# Patient Record
Sex: Female | Born: 1975 | Race: White | Hispanic: No | Smoking: Never smoker
Health system: Southern US, Community
[De-identification: ages and names within clinical notes are randomized; demographics above are authoritative.]

## PROBLEM LIST (undated history)

## (undated) ENCOUNTER — Inpatient Hospital Stay (HOSPITAL_COMMUNITY): Payer: Self-pay

## (undated) DIAGNOSIS — L309 Dermatitis, unspecified: Secondary | ICD-10-CM

## (undated) DIAGNOSIS — Z22322 Carrier or suspected carrier of Methicillin resistant Staphylococcus aureus: Secondary | ICD-10-CM

## (undated) DIAGNOSIS — K589 Irritable bowel syndrome without diarrhea: Secondary | ICD-10-CM

## (undated) DIAGNOSIS — G43409 Hemiplegic migraine, not intractable, without status migrainosus: Secondary | ICD-10-CM

## (undated) DIAGNOSIS — R87629 Unspecified abnormal cytological findings in specimens from vagina: Secondary | ICD-10-CM

## (undated) DIAGNOSIS — G43909 Migraine, unspecified, not intractable, without status migrainosus: Secondary | ICD-10-CM

## (undated) DIAGNOSIS — G8190 Hemiplegia, unspecified affecting unspecified side: Secondary | ICD-10-CM

## (undated) DIAGNOSIS — Z8489 Family history of other specified conditions: Secondary | ICD-10-CM

## (undated) DIAGNOSIS — M797 Fibromyalgia: Secondary | ICD-10-CM

## (undated) DIAGNOSIS — F32A Depression, unspecified: Secondary | ICD-10-CM

## (undated) DIAGNOSIS — F329 Major depressive disorder, single episode, unspecified: Secondary | ICD-10-CM

## (undated) DIAGNOSIS — K219 Gastro-esophageal reflux disease without esophagitis: Secondary | ICD-10-CM

## (undated) HISTORY — DX: Fibromyalgia: M79.7

## (undated) HISTORY — PX: COLPOSCOPY: SHX161

## (undated) HISTORY — PX: MULTIPLE TOOTH EXTRACTIONS: SHX2053

## (undated) HISTORY — PX: WRIST SURGERY: SHX841

## (undated) HISTORY — DX: Unspecified abnormal cytological findings in specimens from vagina: R87.629

---

## 2003-04-25 ENCOUNTER — Encounter: Payer: Self-pay | Admitting: Emergency Medicine

## 2003-04-25 ENCOUNTER — Emergency Department (HOSPITAL_COMMUNITY): Admission: EM | Admit: 2003-04-25 | Discharge: 2003-04-25 | Payer: Self-pay | Admitting: Emergency Medicine

## 2003-05-03 ENCOUNTER — Emergency Department (HOSPITAL_COMMUNITY): Admission: EM | Admit: 2003-05-03 | Discharge: 2003-05-03 | Payer: Self-pay | Admitting: Emergency Medicine

## 2003-05-03 ENCOUNTER — Encounter: Payer: Self-pay | Admitting: Emergency Medicine

## 2004-12-21 ENCOUNTER — Ambulatory Visit (HOSPITAL_COMMUNITY): Admission: RE | Admit: 2004-12-21 | Discharge: 2004-12-21 | Payer: Self-pay | Admitting: Obstetrics and Gynecology

## 2005-01-04 ENCOUNTER — Ambulatory Visit (HOSPITAL_COMMUNITY): Admission: RE | Admit: 2005-01-04 | Discharge: 2005-01-04 | Payer: Self-pay | Admitting: Obstetrics and Gynecology

## 2005-07-24 ENCOUNTER — Inpatient Hospital Stay (HOSPITAL_COMMUNITY): Admission: AD | Admit: 2005-07-24 | Discharge: 2005-07-24 | Payer: Self-pay | Admitting: Obstetrics and Gynecology

## 2005-08-16 ENCOUNTER — Inpatient Hospital Stay (HOSPITAL_COMMUNITY): Admission: AD | Admit: 2005-08-16 | Discharge: 2005-08-17 | Payer: Self-pay | Admitting: Obstetrics and Gynecology

## 2008-04-15 ENCOUNTER — Inpatient Hospital Stay (HOSPITAL_COMMUNITY): Admission: AD | Admit: 2008-04-15 | Discharge: 2008-04-16 | Payer: Self-pay | Admitting: Obstetrics and Gynecology

## 2008-08-27 ENCOUNTER — Inpatient Hospital Stay (HOSPITAL_COMMUNITY): Admission: AD | Admit: 2008-08-27 | Discharge: 2008-08-28 | Payer: Self-pay | Admitting: Obstetrics and Gynecology

## 2008-08-30 ENCOUNTER — Inpatient Hospital Stay (HOSPITAL_COMMUNITY): Admission: AD | Admit: 2008-08-30 | Discharge: 2008-08-30 | Payer: Self-pay | Admitting: Obstetrics and Gynecology

## 2008-10-05 ENCOUNTER — Inpatient Hospital Stay (HOSPITAL_COMMUNITY): Admission: AD | Admit: 2008-10-05 | Discharge: 2008-10-05 | Payer: Self-pay | Admitting: Obstetrics & Gynecology

## 2008-10-07 ENCOUNTER — Inpatient Hospital Stay (HOSPITAL_COMMUNITY): Admission: AD | Admit: 2008-10-07 | Discharge: 2008-10-07 | Payer: Self-pay | Admitting: Obstetrics and Gynecology

## 2008-10-08 ENCOUNTER — Inpatient Hospital Stay (HOSPITAL_COMMUNITY): Admission: AD | Admit: 2008-10-08 | Discharge: 2008-10-09 | Payer: Self-pay | Admitting: Obstetrics and Gynecology

## 2008-11-19 HISTORY — PX: WRIST SURGERY: SHX841

## 2010-04-27 ENCOUNTER — Ambulatory Visit: Payer: Self-pay | Admitting: Emergency Medicine

## 2010-04-27 DIAGNOSIS — S81009A Unspecified open wound, unspecified knee, initial encounter: Secondary | ICD-10-CM | POA: Insufficient documentation

## 2010-04-27 DIAGNOSIS — S81809A Unspecified open wound, unspecified lower leg, initial encounter: Secondary | ICD-10-CM

## 2010-04-27 DIAGNOSIS — K219 Gastro-esophageal reflux disease without esophagitis: Secondary | ICD-10-CM | POA: Insufficient documentation

## 2010-04-27 DIAGNOSIS — S91009A Unspecified open wound, unspecified ankle, initial encounter: Secondary | ICD-10-CM

## 2010-07-06 ENCOUNTER — Encounter: Payer: Self-pay | Admitting: Emergency Medicine

## 2010-12-19 NOTE — Assessment & Plan Note (Signed)
Summary: LACERATION TO LEG/KH   Vital Signs:  Patient Profile:   35 Years Old Female CC:      laceration to right lower leg X today Height:     66.5 inches Weight:      126 pounds O2 Sat:      100 % O2 treatment:    Room Air Temp:     98.5 degrees F oral Pulse rate:   80 / minute Pulse rhythm:   regular Resp:     12 per minute BP sitting:   122 / 78  (right arm) Cuff size:   regular  Pt. in pain?   yes    Location:   right lower leg    Intensity:   8    Type:       burning  Vitals Entered By: Lajean Saver RN (April 27, 2010 1:04 PM)                   Updated Prior Medication List: YAZ 3-0.02 MG TABS (DROSPIRENONE-ETHINYL ESTRADIOL) once daily ALLEGRA-D 24 HOUR 180-240 MG XR24H-TAB (FEXOFENADINE-PSEUDOEPHEDRINE) once daily IMITREX STATDOSE SYSTEM 6 MG/0.5ML KIT (SUMATRIPTAN SUCCINATE) prn  Current Allergies: ! * TRIBUTALIN  History of Present Illness Chief Complaint: laceration to right lower leg X today History of Present Illness: 35yo WF scraped her right shin on a metal shelf at [place].  Last Td in '04.  She fell down on the ground at the time because of her low blood sugar problems but didn't hit head. She drank OJ and felt better and has no hypoglycemic symptoms currently. The leg barely bled for a few minutes but it is tender.  She is here to see if she needs stitches.  REVIEW OF SYSTEMS Constitutional Symptoms      Denies fever, chills, night sweats, weight loss, weight gain, and fatigue.  Eyes       Denies change in vision, eye pain, eye discharge, glasses, contact lenses, and eye surgery. Ear/Nose/Throat/Mouth       Denies hearing loss/aids, change in hearing, ear pain, ear discharge, dizziness, frequent runny nose, frequent nose bleeds, sinus problems, sore throat, hoarseness, and tooth pain or bleeding.  Respiratory       Denies dry cough, productive cough, wheezing, shortness of breath, asthma, bronchitis, and emphysema/COPD.  Cardiovascular  Denies murmurs, chest pain, and tires easily with exhertion.    Gastrointestinal       Denies stomach pain, nausea/vomiting, diarrhea, constipation, blood in bowel movements, and indigestion. Genitourniary       Denies painful urination, kidney stones, and loss of urinary control. Neurological       Denies paralysis, seizures, and fainting/blackouts. Musculoskeletal       Denies muscle pain, joint pain, joint stiffness, decreased range of motion, redness, swelling, muscle weakness, and gout.  Skin       Denies bruising, unusual mles/lumps or sores, and hair/skin or nail changes.      Comments: laceration to right lower leg Psych       Denies mood changes, temper/anger issues, anxiety/stress, speech problems, depression, and sleep problems. 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  FROM leg and foot on right side Neurological: grossly intact and non-focal Skin: RLE shin 1.5cm superficial scrape, not bleeding, not a deep cut, skin already approximately normally, tender around the area, no erythema Assessment New Problems: WOUND OPEN, KNEE/LEG/ANKLE W/O COMPLICATION (ICD-891.0) GERD (ICD-530.81)   Plan New Orders: New Patient Level III  [99203]  The patient and/or caregiver has been counseled thoroughly with regard to medications prescribed including dosage, schedule, interactions, rationale for use, and possible side effects and they verbalize understanding.  Diagnoses and expected course of recovery discussed and will return if not improved as expected or if the condition worsens. Patient and/or caregiver verbalized understanding.   PROCEDURE:  Dermabond Site: right lower leg, shin Size: 1.5cm Number of Lacerations: 1 Anesthesia: none Procedure: Cleaned small laceration with Hibaclens, used dermabond over the cut, then placed 1/4" steristrips over entire wound.  Pt tolerated proc well, no questions afterwards. No blood loss. Disposition: home  Patient Instructions: 1)  Ice the area to decrease inflammation and pain. Tylenol as needed.  Follow-up with your primary care physician if not improving or any signs of infection develop or new symptoms.  Orders Added: 1)  New Patient Level III [[address]]

## 2010-12-19 NOTE — Letter (Signed)
Summary: [facility]  [facility]   Imported By: Dannette Barbara 07/06/2010 09:40:01  _____________________________________________________________________  External Attachment:    Type:   Image     Comment:   External Document

## 2011-04-06 NOTE — H&P (Signed)
NAMEPAZ, FUENTES NO.:  [id number]   MEDICAL RECORD NO.:  [id number]           PATIENT TYPE:   LOCATION:                                 FACILITY:   PHYSICIAN:  Lenoard Aden, M.D.     DATE OF BIRTH:   DATE OF ADMISSION:  08/16/2005  DATE OF DISCHARGE:                                HISTORY & PHYSICAL   CHIEF COMPLAINT:  Labor.   HISTORY OF PRESENT ILLNESS:  She is a 35 year old G3, P2, history of  precipitous labor, group B Strep positive who presents with cervical change.  She has allergies to terbutaline.   MEDICATIONS:  Prenatal vitamins.   She is a nonsmoker, nondrinker.  Denies domestic or physical violence.  History of precipitous labor in her two previous pregnancies with  spontaneous vaginal deliveries at 36 weeks and otherwise uncomplicated  pregnancy outcomes.  Had questionable postpartum hemorrhage.  She has a  family history of sarcoidosis, insulin-dependent diabetes, cerebrovascular  disease.   PRENATAL LABORATORIES:  All within normal limits.  GBS is positive.   PHYSICAL EXAMINATION:  GENERAL:  She is a well-developed, well-nourished  white female in no acute distress.  HEENT:  Normal.  LUNGS:  Clear.  HEART:  Regular rate and rhythm.  ABDOMEN:  Soft, gravid, and nontender.  Estimated fetal weight by ultrasound  8 pounds.  PELVIC:  Cervix 4 cm dilated, 80%, vertex, -1.  EXTREMITIES:  No cords.  NEUROLOGIC:  Nonfocal.   IMPRESSION:  1.  A 39-week OB.  2.  History of precipitous labor with cervical change noted.   PLAN:  To [facility] for augmentation and probable attempts at vaginal  delivery.      Lenoard Aden, M.D.  Electronically Signed     RJT/MEDQ  D:  08/16/2005  T:  08/16/2005  Job:  [id number]

## 2011-08-20 LAB — URINALYSIS, ROUTINE W REFLEX MICROSCOPIC
Bilirubin Urine: NEGATIVE
Glucose, UA: NEGATIVE
Hgb urine dipstick: NEGATIVE
Ketones, ur: NEGATIVE
Nitrite: NEGATIVE
Protein, ur: NEGATIVE
Specific Gravity, Urine: 1.01
Urobilinogen, UA: 0.2
pH: 6.5

## 2011-08-20 LAB — DIC (DISSEMINATED INTRAVASCULAR COAGULATION) PANEL (NOT AT ARMC)
Platelets: 248
Prothrombin Time: 13.8
Smear Review: NONE SEEN

## 2011-08-20 LAB — CBC
HCT: 29.9 — ABNORMAL LOW
HCT: 31.3 — ABNORMAL LOW
Hemoglobin: 10.1 — ABNORMAL LOW
Hemoglobin: 10.6 — ABNORMAL LOW
MCHC: 33.9
MCHC: 34
MCV: 94.2
MCV: 94.6
Platelets: 232
Platelets: 240
RBC: 3.16 — ABNORMAL LOW
RBC: 3.32 — ABNORMAL LOW
RDW: 13.7
RDW: 13.9
WBC: 8.8
WBC: 9

## 2011-08-20 LAB — DIC (DISSEMINATED INTRAVASCULAR COAGULATION)PANEL
D-Dimer, Quant: 1.51 — ABNORMAL HIGH
Fibrinogen: 328
INR: 1
aPTT: 27

## 2011-08-20 LAB — URINE MICROSCOPIC-ADD ON: RBC / HPF: NONE SEEN

## 2011-08-21 LAB — CBC
HCT: 26.3 — ABNORMAL LOW
HCT: 32.3 — ABNORMAL LOW
Hemoglobin: 11.2 — ABNORMAL LOW
Hemoglobin: 9.3 — ABNORMAL LOW
MCHC: 34.6
MCHC: 35.2
MCV: 93.4
MCV: 94.4
Platelets: 209
Platelets: 246
RBC: 2.79 — ABNORMAL LOW
RBC: 3.46 — ABNORMAL LOW
RDW: 13.1
RDW: 13.7
WBC: 9.6
WBC: 9.7

## 2011-08-21 LAB — RPR: RPR Ser Ql: NONREACTIVE

## 2012-12-30 ENCOUNTER — Encounter: Payer: Self-pay | Admitting: *Deleted

## 2012-12-30 ENCOUNTER — Emergency Department (INDEPENDENT_AMBULATORY_CARE_PROVIDER_SITE_OTHER)
Admission: EM | Admit: 2012-12-30 | Discharge: 2012-12-30 | Disposition: A | Discharge status: Home / Self Care | Payer: Medicare Other | Source: Home / Self Care | Attending: Family Medicine | Admitting: Family Medicine

## 2012-12-30 DIAGNOSIS — J029 Acute pharyngitis, unspecified: Secondary | ICD-10-CM

## 2012-12-30 DIAGNOSIS — H60399 Other infective otitis externa, unspecified ear: Secondary | ICD-10-CM

## 2012-12-30 HISTORY — DX: Gastro-esophageal reflux disease without esophagitis: K21.9

## 2012-12-30 HISTORY — DX: Irritable bowel syndrome, unspecified: K58.9

## 2012-12-30 HISTORY — DX: Major depressive disorder, single episode, unspecified: F32.9

## 2012-12-30 HISTORY — DX: Carrier or suspected carrier of methicillin resistant Staphylococcus aureus: Z22.322

## 2012-12-30 HISTORY — DX: Depression, unspecified: F32.A

## 2012-12-30 HISTORY — DX: Migraine, unspecified, not intractable, without status migrainosus: G43.909

## 2012-12-30 LAB — POCT RAPID STREP A (OFFICE): Rapid Strep A Screen: NEGATIVE

## 2012-12-30 MED ORDER — NEOMYCIN-POLYMYXIN-HC 3.5-10000-1 OT SOLN: 3.0000 [drp] | Freq: Four times a day (QID) | OTIC | Status: AC

## 2012-12-30 NOTE — ED Notes (Signed)
Pt c/o sore throat x 1 day. Denies fever.  

## 2012-12-30 NOTE — ED Provider Notes (Signed)
History     CSN: [id number]  Arrival date & time 12/30/12  1308   First MD Initiated Contact with Patient 12/30/12 1936      Chief Complaint  Patient presents with  . Sore Throat    HPI  URI Symptoms Onset: 2 days  Description: rhinorrhea, nasal congestion, sore throat, L ear pain  Modifying factors:  None   Symptoms Nasal discharge: yes Fever: no Sore throat: yes Cough: no Wheezing: no Ear pain: yes GI symptoms: mild Sick contacts: yes  Red Flags  Stiff neck: no Dyspnea: no Rash: no Swallowing difficulty: no  Sinusitis Risk Factors Headache/face pain: no Double sickening: no tooth pain: no  Allergy Risk Factors Sneezing: no Itchy scratchy throat: mild Seasonal symptoms: yes  Flu Risk Factors Headache: no muscle aches: no severe fatigue: no   Past Medical History  Diagnosis Date  . Migraine   . IBS (irritable bowel syndrome)   . Depression   . MRSA (methicillin resistant staph aureus) culture positive   . GERD (gastroesophageal reflux disease)     Past Surgical History  Procedure Laterality Date  . Wrist surgery      cyst removal    Family History  Problem Relation Age of Onset  . Thyroid disease Mother   . Scoliosis Mother   . Depression Mother   . Crohn's disease Father   . Autoimmune disease Father   . Factor V Leiden deficiency Father   . Heart failure Father   . Diabetes Father     History  Substance Use Topics  . Smoking status: Never Smoker   . Smokeless tobacco: Not on file  . Alcohol Use: No    OB History   Grav Para Term Preterm Abortions TAB SAB Ect Mult Living                  Review of Systems  All other systems reviewed and are negative.    Allergies  Adhesive  Home Medications   Current Outpatient Rx  Name  Route  Sig  Dispense  Refill  . loratadine-pseudoephedrine (CLARITIN-D 24-HOUR) 10-240 MG per 24 hr tablet   Oral   Take 1 tablet by mouth daily.         . norethindrone  (MICRONOR,CAMILA,ERRIN) 0.35 MG tablet   Oral   Take 1 tablet by mouth daily.         . sertraline (ZOLOFT) 50 MG tablet   Oral   Take 50 mg by mouth daily.         Marland Kitchen neomycin-polymyxin-hydrocortisone (CORTISPORIN) otic solution   Left Ear   Place 3 drops into the left ear 4 (four) times daily.   10 mL   0     BP 124/77  Pulse 82  Temp(Src) 98.1 F (36.7 C) (Oral)  Ht 5' 6.75" (1.695 m)  Wt 148 lb (67.132 kg)  BMI 23.37 kg/m2  SpO2 98%  Physical Exam  Constitutional: She appears well-developed and well-nourished.  HENT:  Head: Normocephalic and atraumatic.  Right Ear: External ear normal.  Left Ear: External ear normal.  +nasal erythema, rhinorrhea bilaterally, + post oropharyngeal erythema    Eyes: Conjunctivae are normal. Pupils are equal, round, and reactive to light.  Neck: Normal range of motion. Neck supple.  Cardiovascular: Normal rate, regular rhythm and normal heart sounds.   Pulmonary/Chest: Effort normal and breath sounds normal.  Abdominal: Soft. Bowel sounds are normal.  Musculoskeletal: Normal range of motion.  Lymphadenopathy:    She  has cervical adenopathy.  Neurological: She is alert.  Skin: Skin is warm.    ED Course  Procedures (including critical care time)  Labs Reviewed  STREP A DNA PROBE  POCT RAPID STREP A (OFFICE)   No results found.   1. Acute pharyngitis   2. Otitis, externa, infective       MDM  Centor score 3/4. Rapid strep negative. Will culture.  Will treat otitis externa with cortisporin.  Discussed infectious and ENT red flags that would warrant emergent evaluation.  Follow up as needed.     The patient and/or caregiver has been counseled thoroughly with regard to treatment plan and/or medications prescribed including dosage, schedule, interactions, rationale for use, and possible side effects and they verbalize understanding. Diagnoses and expected course of recovery discussed and will return if not improved as  expected or if the condition worsens. Patient and/or caregiver verbalized understanding.             Doree Albee, MD 12/30/12 1958

## 2012-12-31 LAB — STREP A DNA PROBE: GASP: NEGATIVE

## 2013-01-02 ENCOUNTER — Telehealth: Payer: Self-pay | Admitting: *Deleted

## 2014-07-20 DIAGNOSIS — Z973 Presence of spectacles and contact lenses: Secondary | ICD-10-CM | POA: Insufficient documentation

## 2014-11-19 NOTE — L&D Delivery Note (Signed)
Patient arrived to MAU and was 8 cm dilated. She was immediately transferred to L and D.  I was en route to the hospital when she delivered precipitously in the bed.  Baby was vigorous infant. When I arrived, the midwife was present and delivered the placenta.  At that point of care I took over.   No lacerations. EBL 300 cc. Apgars 9 9  Weight pending Mother to postpartum and baby to regular nursery after skin to skin.

## 2014-12-28 LAB — OB RESULTS CONSOLE ABO/RH: RH Type: POSITIVE

## 2014-12-28 LAB — OB RESULTS CONSOLE RUBELLA ANTIBODY, IGM: Rubella: IMMUNE

## 2014-12-28 LAB — OB RESULTS CONSOLE ANTIBODY SCREEN: Antibody Screen: NEGATIVE

## 2014-12-28 LAB — OB RESULTS CONSOLE HEPATITIS B SURFACE ANTIGEN: Hepatitis B Surface Ag: NEGATIVE

## 2014-12-28 LAB — OB RESULTS CONSOLE RPR: RPR: NONREACTIVE

## 2014-12-28 LAB — OB RESULTS CONSOLE HIV ANTIBODY (ROUTINE TESTING): HIV: NONREACTIVE

## 2015-01-07 LAB — OB RESULTS CONSOLE GC/CHLAMYDIA
Chlamydia: NEGATIVE
Gonorrhea: NEGATIVE

## 2015-05-26 ENCOUNTER — Inpatient Hospital Stay (HOSPITAL_COMMUNITY)
Admission: AD | Admit: 2015-05-26 | Discharge: 2015-05-26 | Disposition: A | Discharge status: Home / Self Care | Payer: 59 | Source: Ambulatory Visit | Attending: Obstetrics & Gynecology | Admitting: Obstetrics & Gynecology

## 2015-05-26 ENCOUNTER — Encounter (HOSPITAL_COMMUNITY): Payer: Self-pay

## 2015-05-26 DIAGNOSIS — Z3A36 36 weeks gestation of pregnancy: Secondary | ICD-10-CM | POA: Diagnosis not present

## 2015-05-26 DIAGNOSIS — O368130 Decreased fetal movements, third trimester, not applicable or unspecified: Secondary | ICD-10-CM | POA: Insufficient documentation

## 2015-05-26 DIAGNOSIS — O4700 False labor before 37 completed weeks of gestation, unspecified trimester: Secondary | ICD-10-CM | POA: Diagnosis not present

## 2015-05-26 DIAGNOSIS — Z3A31 31 weeks gestation of pregnancy: Secondary | ICD-10-CM | POA: Diagnosis not present

## 2015-05-26 DIAGNOSIS — O4703 False labor before 37 completed weeks of gestation, third trimester: Secondary | ICD-10-CM | POA: Diagnosis not present

## 2015-05-26 LAB — CBC WITH DIFFERENTIAL/PLATELET
Basophils Absolute: 0 10*3/uL (ref 0.0–0.1)
Basophils Relative: 0 % (ref 0–1)
Eosinophils Absolute: 0.2 10*3/uL (ref 0.0–0.7)
Eosinophils Relative: 2 % (ref 0–5)
HCT: 31.7 % — ABNORMAL LOW (ref 36.0–46.0)
Hemoglobin: 10.6 g/dL — ABNORMAL LOW (ref 12.0–15.0)
Lymphocytes Relative: 23 % (ref 12–46)
Lymphs Abs: 2 10*3/uL (ref 0.7–4.0)
MCH: 30.3 pg (ref 26.0–34.0)
MCHC: 33.4 g/dL (ref 30.0–36.0)
MCV: 90.6 fL (ref 78.0–100.0)
Monocytes Absolute: 0.7 10*3/uL (ref 0.1–1.0)
Monocytes Relative: 7 % (ref 3–12)
Neutro Abs: 6.2 10*3/uL (ref 1.7–7.7)
Neutrophils Relative %: 68 % (ref 43–77)
Platelets: 219 10*3/uL (ref 150–400)
RBC: 3.5 MIL/uL — ABNORMAL LOW (ref 3.87–5.11)
RDW: 14 % (ref 11.5–15.5)
WBC: 9.1 10*3/uL (ref 4.0–10.5)

## 2015-05-26 LAB — URINALYSIS, ROUTINE W REFLEX MICROSCOPIC
Bilirubin Urine: NEGATIVE
Glucose, UA: NEGATIVE mg/dL
Hgb urine dipstick: NEGATIVE
Ketones, ur: NEGATIVE mg/dL
Nitrite: NEGATIVE
Protein, ur: NEGATIVE mg/dL
Specific Gravity, Urine: <1.005 — ABNORMAL LOW (ref 1.005–1.030)
Urobilinogen, UA: 0.2 mg/dL (ref 0.0–1.0)
pH: 5.5 (ref 5.0–8.0)

## 2015-05-26 LAB — WET PREP, GENITAL
Clue Cells Wet Prep HPF POC: NONE SEEN
Trich, Wet Prep: NONE SEEN
Yeast Wet Prep HPF POC: NONE SEEN

## 2015-05-26 LAB — URINE MICROSCOPIC-ADD ON

## 2015-05-26 MED ORDER — LACTATED RINGERS IV BOLUS (SEPSIS)
1000.0000 mL | Freq: Once | INTRAVENOUS | Status: AC
  Administered 2015-05-26: 1000 mL via INTRAVENOUS

## 2015-05-26 MED ORDER — HYDROCODONE-ACETAMINOPHEN 5-325 MG PO TABS
2.0000 | ORAL_TABLET | Freq: Once | ORAL | Status: AC
  Administered 2015-05-26: 2 via ORAL
  Filled 2015-05-26: qty 2

## 2015-05-26 MED ORDER — BUTALBITAL-APAP-CAFFEINE 50-325-40 MG PO TABS: 2.0000 | ORAL_TABLET | Freq: Once | ORAL | Status: DC

## 2015-05-26 MED ORDER — NIFEDIPINE 10 MG PO CAPS
20.0000 mg | ORAL_CAPSULE | ORAL | Status: AC
  Administered 2015-05-26 (×3): 20 mg via ORAL
  Filled 2015-05-26 (×3): qty 2

## 2015-05-26 NOTE — Discharge Instructions (Signed)

## 2015-05-26 NOTE — MAU Note (Signed)
Vaginal spotting since Tuesday; was brown spotting, tonight changed to bright red. Denies known problems with placenta.  Right sided abdominal pain since Sunday; constant & burning.  Decreased fetal movement x 4 days; still feeling baby move but says there's a change in her movement.

## 2015-05-26 NOTE — MAU Provider Note (Signed)
History     CSN: [id number]  Arrival date and time: 05/26/15 [id number]   First Provider Initiated Contact with Patient 05/26/15 2029      Chief Complaint  Patient presents with  . Abdominal Pain  . Vaginal Bleeding  . Decreased Fetal Movement   HPI Comments: Holly [name] is a 39 y.o. [id number] at [id number] who presents today vaginal spotting and right sided abdominal pain. She states that she has had the pain since Sunday. She had some spotting last Tuesday as well. It turned brown, and seemed to stop. However today it returned, and it was red. She has had three prior preterm deliveries. She states that her earliest delivery was at 35.2 weeks.    Abdominal Pain This is a new problem. The current episode started in the past 7 days (Sunday ). The onset quality is gradual. The problem occurs constantly. The problem has been unchanged. The pain is located in the RLQ. The pain is at a severity of 4/10. The quality of the pain is sharp. The abdominal pain does not radiate. Associated symptoms include diarrhea, nausea and vomiting. Pertinent negatives include no constipation, dysuria, fever or frequency. The pain is aggravated by movement. The pain is relieved by recumbency (warm bath ). The treatment provided mild relief.  Vaginal Bleeding The patient's primary symptoms include vaginal bleeding. This is a new problem. The current episode started in the past 7 days. The problem occurs intermittently. The pain is moderate. She is pregnant. Associated symptoms include abdominal pain, diarrhea, nausea and vomiting. Pertinent negatives include no constipation, dysuria, fever, frequency or urgency. The vaginal discharge was bloody. The vaginal bleeding is spotting. She has not been passing clots. She has not been passing tissue. Sexual activity: no intercourse in the last 24 hours     Past Medical History  Diagnosis Date  . Migraine   . IBS (irritable bowel syndrome)   . Depression   . MRSA (methicillin  resistant staph aureus) culture positive   . GERD (gastroesophageal reflux disease)     Past Surgical History  Procedure Laterality Date  . Wrist surgery      cyst removal    Family History  Problem Relation Age of Onset  . Thyroid disease Mother   . Scoliosis Mother   . Depression Mother   . Crohn's disease Father   . Autoimmune disease Father   . Factor V Leiden deficiency Father   . Heart failure Father   . Diabetes Father     History  Substance Use Topics  . Smoking status: Never Smoker   . Smokeless tobacco: Not on file  . Alcohol Use: No    Allergies:  Allergies  Allergen Reactions  . Terbutaline Palpitations  . Adhesive [Tape] Itching and Rash  . Ciprofloxacin Rash  . Zyrtec Allergy [Cetirizine Hcl] Other (See Comments)    Overly sleepy    Prescriptions prior to admission  Medication Sig Dispense Refill Last Dose  . butalbital-acetaminophen-caffeine (FIORICET WITH CODEINE) 50-325-40-30 MG per capsule Take 1 capsule by mouth every 4 (four) hours as needed for headache.   05/26/2015 at 1200  . calcium carbonate (TUMS - DOSED IN MG ELEMENTAL CALCIUM) 500 MG chewable tablet Chew 1 tablet by mouth as needed for indigestion or heartburn. Patient takes up to six times daily prn.   05/26/2015 at Unknown time  . Prenatal Vit-Fe Fumarate-FA (PRENATAL MULTIVITAMIN) TABS tablet Take 1 tablet by mouth at bedtime.   05/25/2015 at Unknown time  . QUEtiapine (  SEROQUEL) 25 MG tablet Take 25-50 mg by mouth at bedtime.    05/25/2015 at Unknown time    Review of Systems  Constitutional: Negative for fever.  Gastrointestinal: Positive for nausea, vomiting, abdominal pain and diarrhea. Negative for constipation.  Genitourinary: Positive for vaginal bleeding. Negative for dysuria, urgency and frequency.   Physical Exam   Blood pressure 114/73, pulse 82, temperature 98.5 F (36.9 C), temperature source Oral, resp. rate 18, height  (1.676 m), weight 77.656 kg (171 lb 3.2 oz), SpO2  100 %.  Physical Exam  Nursing note and vitals reviewed. Constitutional: She is oriented to person, place, and time. She appears well-developed and well-nourished. No distress.  HENT:  Head: Normocephalic.  Cardiovascular: Normal rate.   Respiratory: Effort normal.  GI: Soft. There is no tenderness. There is no rebound.  Genitourinary:   External: no lesion Vagina: small amount of white discharge. No blood seen  Cervix: pink, smooth, closed/thick/high  Uterus: AGA    Neurological: She is alert and oriented to person, place, and time.  Skin: Skin is warm and dry.   FHT 135, moderate with 15x15 accels, no decels Toco: q 3-5 mins  MAU Course  Procedures  MDM 2207: Patient has had three doses of procardia and 1L of LR. Contractions have decreased. Now about q 10-12 mins  Patient reports that abdominal pain is gone now, and her headache has improved. 2215: D/W Dr. Langston Masker, ok for DC home at this time.   Assessment and Plan   1. Threatened preterm labor, third trimester    DC home Comfort measures reviewed  3rd Trimester precautions  PTL precautions  Fetal kick counts RX: none  Return to MAU as needed FU with OB as planned  Follow-up Information    Follow up with MORRIS, MEGAN, DO.   Specialty:  Obstetrics and Gynecology   Why:  As scheduled   Contact information:   [address], [address] [place] [address] [phone]         Tawnya Crook 05/26/2015, 8:31 PM

## 2015-06-06 ENCOUNTER — Inpatient Hospital Stay (HOSPITAL_COMMUNITY)
Admission: AD | Admit: 2015-06-06 | Discharge: 2015-06-06 | Disposition: A | Discharge status: Home / Self Care | Payer: Medicare (Managed Care) | Source: Ambulatory Visit | Attending: Obstetrics and Gynecology | Admitting: Obstetrics and Gynecology

## 2015-06-06 ENCOUNTER — Encounter (HOSPITAL_COMMUNITY): Payer: Self-pay

## 2015-06-06 DIAGNOSIS — Z3A32 32 weeks gestation of pregnancy: Secondary | ICD-10-CM

## 2015-06-06 DIAGNOSIS — O212 Late vomiting of pregnancy: Secondary | ICD-10-CM | POA: Insufficient documentation

## 2015-06-06 DIAGNOSIS — O4703 False labor before 37 completed weeks of gestation, third trimester: Secondary | ICD-10-CM | POA: Diagnosis not present

## 2015-06-06 DIAGNOSIS — Z3A31 31 weeks gestation of pregnancy: Secondary | ICD-10-CM | POA: Diagnosis not present

## 2015-06-06 LAB — URINALYSIS, ROUTINE W REFLEX MICROSCOPIC
Bilirubin Urine: NEGATIVE
Glucose, UA: NEGATIVE mg/dL
Ketones, ur: NEGATIVE mg/dL
Nitrite: NEGATIVE
Protein, ur: NEGATIVE mg/dL
Specific Gravity, Urine: <1.005 — ABNORMAL LOW (ref 1.005–1.030)
Urobilinogen, UA: 0.2 mg/dL (ref 0.0–1.0)
pH: 6.5 (ref 5.0–8.0)

## 2015-06-06 LAB — URINE MICROSCOPIC-ADD ON

## 2015-06-06 LAB — FETAL FIBRONECTIN: Fetal Fibronectin: NEGATIVE

## 2015-06-06 MED ORDER — PROMETHAZINE HCL 12.5 MG PO TABS: 12.5000 mg | ORAL_TABLET | Freq: Four times a day (QID) | ORAL | Status: DC | PRN

## 2015-06-06 NOTE — MAU Provider Note (Signed)
Chief Complaint:  Morning Sickness; Emesis During Pregnancy; Contractions; and Diarrhea   First Provider Initiated Contact with Patient 06/06/15 1957      HPI: Holly [name] is a 39 y.o. [id number] at 4767w6d who presents to maternity admissions reporting nausea, vomiting, diarrhea off and on accompanied by abdominal cramping all with onset yesterday morning.  She reports she feels like this in early labor and is worried that is what is going on.  She reports 2-3 episodes of vomiting and diarrhea x 3 since onset of symptoms.  She denies sick contacts. She reports good fetal movement, denies LOF, vaginal bleeding, vaginal itching/burning, urinary symptoms, h/a, dizziness, or fever/chills.    Diarrhea  This is a new problem. The current episode started yesterday. The problem occurs 2 to 4 times per day. The problem has been unchanged. The stool consistency is described as watery. The patient states that diarrhea does not awaken her from sleep. Associated symptoms include abdominal pain and vomiting. Pertinent negatives include no chills, coughing, fever or headaches. She has tried nothing for the symptoms.  Abdominal Pain This is a recurrent problem. The current episode started yesterday. The onset quality is gradual. The problem occurs intermittently. The most recent episode lasted 1 day. The problem has been waxing and waning. The pain is located in the LLQ and RLQ. The pain is moderate. The quality of the pain is cramping. The abdominal pain radiates to the back. Associated symptoms include diarrhea, nausea and vomiting. Pertinent negatives include no constipation, dysuria, fever, frequency or headaches. Nothing aggravates the pain. She has tried nothing for the symptoms.    Past Medical History: Past Medical History  Diagnosis Date  . Migraine   . IBS (irritable bowel syndrome)   . Depression   . MRSA (methicillin resistant staph aureus) culture positive   . GERD (gastroesophageal reflux  disease)     Past obstetric history: OB History  Gravida Para Term Preterm AB SAB TAB Ectopic Multiple Living  6 5 2 3      5     # Outcome Date GA Lbr Len/2nd Weight Sex Delivery Anes PTL Lv  6 Current           5 Term 02/2012 1246w0d  7 lb 1 oz (3.204 kg) Genella MechM Vag-Spont EPI  Y  4 Term 09/2008 7125w2d  7 lb 9 oz (3.43 kg) F Vag-Spont Local  Y  3 Preterm 2006 7567w5d  7 lb 10 oz (3.459 kg) F Vag-Spont None  Y  2 Preterm 2004 7651w0d  6 lb 11 oz (3.033 kg) M Vag-Spont   Y  1 Preterm 2001 2460w0d  6 lb 3 oz (2.807 kg) M Vag-Spont   Y      Past Surgical History: Past Surgical History  Procedure Laterality Date  . Wrist surgery      cyst removal    Family History: Family History  Problem Relation Age of Onset  . Thyroid disease Mother   . Scoliosis Mother   . Depression Mother   . Crohn's disease Father   . Autoimmune disease Father   . Factor V Leiden deficiency Father   . Heart failure Father   . Diabetes Father     Social History: History  Substance Use Topics  . Smoking status: Never Smoker   . Smokeless tobacco: Not on file  . Alcohol Use: No    Allergies:  Allergies  Allergen Reactions  . Terbutaline Palpitations  . Adhesive [Tape] Itching and Rash  . Ciprofloxacin  Rash  . Zyrtec Allergy [Cetirizine Hcl] Other (See Comments)    Overly sleepy    Meds:  No prescriptions prior to admission    Review of Systems  Constitutional: Negative for fever, chills and malaise/fatigue.  Eyes: Negative for blurred vision.  Respiratory: Negative for cough and shortness of breath.   Cardiovascular: Negative for chest pain.  Gastrointestinal: Positive for nausea, vomiting, abdominal pain and diarrhea. Negative for heartburn and constipation.  Genitourinary: Negative for dysuria, urgency and frequency.  Musculoskeletal: Negative.   Neurological: Negative for dizziness and headaches.  Psychiatric/Behavioral: Negative for depression.    Physical Exam  Blood pressure 121/58,  pulse 85, temperature 98.3 F (36.8 C), temperature source Oral, resp. rate 16, height 5' 4.5" (1.638 m), weight 170 lb 6.4 oz (77.293 kg). GENERAL: Well-developed, well-nourished female in no acute distress.  EYES: normal sclera/conjunctiva; no lid-lag HENT: Atraumatic, normocephalic HEART: normal rate RESP: normal effort ABDOMEN: Soft, non-tender MUSCULOSKELETAL: Normal ROM EXTREMITIES: Nontender, no edema NEURO/PSYCH: Alert and oriented, appropriate affect Dilation: Closed Effacement (%): 20 Cervical Position: Posterior Station: -3 Presentation: Vertex Exam by:: Harlon Flor, PA-C  Labs: Results for orders placed or performed during the hospital encounter of 06/06/15 (from the past 24 hour(s))  Urinalysis, Routine w reflex microscopic (not at [facility])     Status: Abnormal   Collection Time: 06/06/15  6:31 PM  Result Value Ref Range   Color, Urine YELLOW YELLOW   APPearance CLEAR CLEAR   Specific Gravity, Urine <1.005 (L) 1.005 - 1.030   pH 6.5 5.0 - 8.0   Glucose, UA NEGATIVE NEGATIVE mg/dL   Hgb urine dipstick TRACE (A) NEGATIVE   Bilirubin Urine NEGATIVE NEGATIVE   Ketones, ur NEGATIVE NEGATIVE mg/dL   Protein, ur NEGATIVE NEGATIVE mg/dL   Urobilinogen, UA 0.2 0.0 - 1.0 mg/dL   Nitrite NEGATIVE NEGATIVE   Leukocytes, UA SMALL (A) NEGATIVE  Urine microscopic-add on     Status: None   Collection Time: 06/06/15  6:31 PM  Result Value Ref Range   Squamous Epithelial / LPF RARE RARE   WBC, UA 3-6 <3 WBC/hpf   RBC / HPF 0-2 <3 RBC/hpf   Bacteria, UA RARE RARE  Fetal fibronectin     Status: None   Collection Time: 06/06/15  8:30 PM  Result Value Ref Range   Fetal Fibronectin NEGATIVE NEGATIVE   Report to Vonzella Nipple, PA  Follow-up Information    Follow up with LOWE,DAVID C, MD.   Specialty:  Obstetrics and Gynecology   Why:  As scheduled or sooner if symptoms worsen   Contact information:   [address] [place] [address] [phone]        Sharen Counter Certified Nurse-Midwife 06/06/2015 8:07 PM  MDM 2008 - Care assumed from Sharen Counter, CNM FFN - negative Discussed patient with Dr. Rana Snare. Recommends Rx for Phenergan. Follow-up as scheduled  Fetal Monitoring: Baseline: 140 bpm, moderate variability, + accelerations, no decelerations Contractions: q 4 minutes with intermittent UI  A: SIUP at [redacted]w[redacted]d Preterm contractions N/V in pregnancy  P: Discharge home Rx for Phenergan sent to patient's pharmacy Preterm labor precautions discussed Patient advised to follow-up with [facility] as scheduled for routine prenatal care or sooner PRN Patient may return to MAU as needed or if her condition were to change or worsen   Marny Lowenstein, PA-C  06/06/2015 11:14 PM

## 2015-06-06 NOTE — Discharge Instructions (Signed)
Pelvic Rest °Pelvic rest is sometimes recommended for women when:  °· The placenta is partially or completely covering the opening of the cervix (placenta previa). °· There is bleeding between the uterine wall and the amniotic sac in the first trimester (subchorionic hemorrhage). °· The cervix begins to open without labor starting (incompetent cervix, cervical insufficiency). °· The labor is too early (preterm labor). °HOME CARE INSTRUCTIONS °· Do not have sexual intercourse, stimulation, or an orgasm. °· Do not use tampons, douche, or put anything in the vagina. °· Do not lift anything over 10 pounds (4.5 kg). °· Avoid strenuous activity or straining your pelvic muscles. °SEEK MEDICAL CARE IF:  °· You have any vaginal bleeding during pregnancy. Treat this as a potential emergency. °· You have cramping pain felt low in the stomach (stronger than menstrual cramps). °· You notice vaginal discharge (watery, mucus, or bloody). °· You have a low, dull backache. °· There are regular contractions or uterine tightening. °SEEK IMMEDIATE MEDICAL CARE IF: °You have vaginal bleeding and have placenta previa.  °Document Released: 03/02/2011 Document Revised: 01/28/2012 Document Reviewed: 03/02/2011 °ExitCare® Patient Information ©2015 [employer]. This information is not intended to replace advice given to you by your health care provider. Make sure you discuss any questions you have with your health care provider. ° °Braxton Hicks Contractions °Contractions of the uterus can occur throughout pregnancy. Contractions are not always a sign that you are in labor.  °WHAT ARE BRAXTON HICKS CONTRACTIONS?  °Contractions that occur before labor are called Braxton Hicks contractions, or false labor. Toward the end of pregnancy (32-34 weeks), these contractions can develop more often and may become more forceful. This is not true labor because these contractions do not result in opening (dilatation) and thinning of the cervix. They are  sometimes difficult to tell apart from true labor because these contractions can be forceful and people have different pain tolerances. You should not feel embarrassed if you go to the hospital with false labor. Sometimes, the only way to tell if you are in true labor is for your health care provider to look for changes in the cervix. °If there are no prenatal problems or other health problems associated with the pregnancy, it is completely safe to be sent home with false labor and await the onset of true labor. °HOW CAN YOU TELL THE DIFFERENCE BETWEEN TRUE AND FALSE LABOR? °False Labor °· The contractions of false labor are usually shorter and not as hard as those of true labor.   °· The contractions are usually irregular.   °· The contractions are often felt in the front of the lower abdomen and in the groin.   °· The contractions may go away when you walk around or change positions while lying down.   °· The contractions get weaker and are shorter lasting as time goes on.   °· The contractions do not usually become progressively stronger, regular, and closer together as with true labor.   °True Labor °· Contractions in true labor last 30-70 seconds, become very regular, usually become more intense, and increase in frequency.   °· The contractions do not go away with walking.   °· The discomfort is usually felt in the top of the uterus and spreads to the lower abdomen and low back.   °· True labor can be determined by your health care provider with an exam. This will show that the cervix is dilating and getting thinner.   °WHAT TO REMEMBER °· Keep up with your usual exercises and follow other instructions given by your   health care provider.   °· Take medicines as directed by your health care provider.   °· Keep your regular prenatal appointments.   °· Eat and drink lightly if you think you are going into labor.   °· If Braxton Hicks contractions are making you uncomfortable:   °· Change your position from lying  down or resting to walking, or from walking to resting.   °· Sit and rest in a tub of warm water.   °· Drink 2-3 glasses of water. Dehydration may cause these contractions.   °· Do slow and deep breathing several times an hour.   °WHEN SHOULD I SEEK IMMEDIATE MEDICAL CARE? °Seek immediate medical care if: °· Your contractions become stronger, more regular, and closer together.   °· You have fluid leaking or gushing from your vagina.   °· You have a fever.   °· You pass blood-tinged mucus.   °· You have vaginal bleeding.   °· You have continuous abdominal pain.   °· You have low back pain that you never had before.   °· You feel your baby's head pushing down and causing pelvic pressure.   °· Your baby is not moving as much as it used to.   °Document Released: 11/05/2005 Document Revised: 11/10/2013 Document Reviewed: 08/17/2013 °ExitCare® Patient Information ©2015 [employer]. This information is not intended to replace advice given to you by your health care provider. Make sure you discuss any questions you have with your health care provider. ° °Preterm Labor Information °Preterm labor is when labor starts at less than 37 weeks of pregnancy. The normal length of a pregnancy is 39 to 41 weeks. °CAUSES °Often, there is no identifiable underlying cause as to why a woman goes into preterm labor. One of the most common known causes of preterm labor is infection. Infections of the uterus, cervix, vagina, amniotic sac, bladder, kidney, or even the lungs (pneumonia) can cause labor to start. Other suspected causes of preterm labor include:  °· Urogenital infections, such as yeast infections and bacterial vaginosis.   °· Uterine abnormalities (uterine shape, uterine septum, fibroids, or bleeding from the placenta).   °· A cervix that has been operated on (it may fail to stay closed).   °· Malformations in the fetus.   °· Multiple gestations (twins, triplets, and so on).   °· Breakage of the amniotic sac.   °RISK  FACTORS °· Having a previous history of preterm labor.   °· Having premature rupture of membranes (PROM).   °· Having a placenta that covers the opening of the cervix (placenta previa).   °· Having a placenta that separates from the uterus (placental abruption).   °· Having a cervix that is too weak to hold the fetus in the uterus (incompetent cervix).   °· Having too much fluid in the amniotic sac (polyhydramnios).   °· Taking illegal drugs or smoking while pregnant.   °· Not gaining enough weight while pregnant.   °· Being younger than 18 and older than 37 years old.   °· Having a low socioeconomic status.   °· Being African American. °SYMPTOMS °Signs and symptoms of preterm labor include:  °· Menstrual-like cramps, abdominal pain, or back pain. °· Uterine contractions that are regular, as frequent as six in an hour, regardless of their intensity (may be mild or painful). °· Contractions that start on the top of the uterus and spread down to the lower abdomen and back.   °· A sense of increased pelvic pressure.   °· A watery or bloody mucus discharge that comes from the vagina.   °TREATMENT °Depending on the length of the pregnancy and other circumstances, your health care provider may suggest bed rest.   If necessary, there are medicines that can be given to stop contractions and to mature the fetal lungs. If labor happens before 34 weeks of pregnancy, a prolonged hospital stay may be recommended. Treatment depends on the condition of both you and the fetus.  °WHAT SHOULD YOU DO IF YOU THINK YOU ARE IN PRETERM LABOR? °Call your health care provider right away. You will need to go to the hospital to get checked immediately. °HOW CAN YOU PREVENT PRETERM LABOR IN FUTURE PREGNANCIES? °You should:  °· Stop smoking if you smoke.  °· Maintain healthy weight gain and avoid chemicals and drugs that are not necessary. °· Be watchful for any type of infection. °· Inform your health care provider if you have a known history of  preterm labor. °Document Released: 01/26/2004 Document Revised: 07/08/2013 Document Reviewed: 12/08/2012 °ExitCare® Patient Information ©2015 [employer]. This information is not intended to replace advice given to you by your health care provider. Make sure you discuss any questions you have with your health care provider. ° °

## 2015-06-06 NOTE — MAU Note (Signed)
Pt c/o n/v/d since yesterday. Denies fever or being around anyone sick. States contractions today

## 2015-06-13 ENCOUNTER — Inpatient Hospital Stay (HOSPITAL_COMMUNITY)
Admission: AD | Admit: 2015-06-13 | Discharge: 2015-06-13 | Disposition: A | Discharge status: Home / Self Care | Payer: Medicare (Managed Care) | Source: Ambulatory Visit | Attending: Obstetrics & Gynecology | Admitting: Obstetrics & Gynecology

## 2015-06-13 ENCOUNTER — Encounter (HOSPITAL_COMMUNITY): Payer: Self-pay | Admitting: *Deleted

## 2015-06-13 DIAGNOSIS — Z3A32 32 weeks gestation of pregnancy: Secondary | ICD-10-CM | POA: Diagnosis not present

## 2015-06-13 DIAGNOSIS — O4703 False labor before 37 completed weeks of gestation, third trimester: Secondary | ICD-10-CM

## 2015-06-13 DIAGNOSIS — O218 Other vomiting complicating pregnancy: Secondary | ICD-10-CM

## 2015-06-13 DIAGNOSIS — O212 Late vomiting of pregnancy: Secondary | ICD-10-CM | POA: Diagnosis not present

## 2015-06-13 DIAGNOSIS — K589 Irritable bowel syndrome without diarrhea: Secondary | ICD-10-CM | POA: Diagnosis not present

## 2015-06-13 DIAGNOSIS — O99613 Diseases of the digestive system complicating pregnancy, third trimester: Secondary | ICD-10-CM | POA: Diagnosis not present

## 2015-06-13 LAB — URINE MICROSCOPIC-ADD ON

## 2015-06-13 LAB — URINALYSIS, ROUTINE W REFLEX MICROSCOPIC
Bilirubin Urine: NEGATIVE
Glucose, UA: NEGATIVE mg/dL
Hgb urine dipstick: NEGATIVE
Ketones, ur: NEGATIVE mg/dL
Nitrite: NEGATIVE
Protein, ur: NEGATIVE mg/dL
Specific Gravity, Urine: 1.01 (ref 1.005–1.030)
Urobilinogen, UA: 0.2 mg/dL (ref 0.0–1.0)
pH: 6 (ref 5.0–8.0)

## 2015-06-13 MED ORDER — ONDANSETRON 8 MG PO TBDP: 8.0000 mg | ORAL_TABLET | Freq: Three times a day (TID) | ORAL | Status: DC | PRN

## 2015-06-13 MED ORDER — ONDANSETRON 8 MG PO TBDP
8.0000 mg | ORAL_TABLET | Freq: Three times a day (TID) | ORAL | Status: DC | PRN
  Administered 2015-06-13: 8 mg via ORAL
  Filled 2015-06-13: qty 1

## 2015-06-13 NOTE — MAU Note (Signed)
Patient presents at [redacted] weeks gestation with c/o vaginal bleeding since Friday following a transvaginal ultrasound; vomiting since Saturday; diarrhea and loss of mucus plug this morning. Fetus active. Denies discharge.

## 2015-06-13 NOTE — MAU Provider Note (Signed)
History   [id number] at 32.6 wks in with c/o contractions, diarrhea, and vomiting since last night. Has gotten a little better today but is concerned with her hx of PTL.  CSN: [id number]  Arrival date and time: 06/13/15 1313   None     Chief Complaint  Patient presents with  . Vaginal Bleeding  . Emesis  . Diarrhea  . Loss of Mucus Plug    HPI  OB History    Gravida Para Term Preterm AB TAB SAB Ectopic Multiple Living   Past Medical History  Diagnosis Date  . Migraine   . IBS (irritable bowel syndrome)   . Depression   . MRSA (methicillin resistant staph aureus) culture positive   . GERD (gastroesophageal reflux disease)     Past Surgical History  Procedure Laterality Date  . Wrist surgery      cyst removal    Family History  Problem Relation Age of Onset  . Thyroid disease Mother   . Scoliosis Mother   . Depression Mother   . Crohn's disease Father   . Autoimmune disease Father   . Factor V Leiden deficiency Father   . Heart failure Father   . Diabetes Father     History  Substance Use Topics  . Smoking status: Never Smoker   . Smokeless tobacco: Not on file  . Alcohol Use: No    Allergies:  Allergies  Allergen Reactions  . Terbutaline Palpitations  . Adhesive [Tape] Itching and Rash  . Ciprofloxacin Rash  . Zyrtec Allergy [Cetirizine Hcl] Other (See Comments)    Overly sleepy    Prescriptions prior to admission  Medication Sig Dispense Refill Last Dose  . acetaminophen (TYLENOL) 500 MG tablet Take 1,000 mg by mouth every 6 (six) hours as needed.   Past Week at Unknown time  . butalbital-acetaminophen-caffeine (FIORICET WITH CODEINE) 50-325-40-30 MG per capsule Take 1 capsule by mouth every 4 (four) hours as needed for headache.   Past Week at Unknown time  . calcium carbonate (TUMS - DOSED IN MG ELEMENTAL CALCIUM) 500 MG chewable tablet Chew 1 tablet by mouth as needed for indigestion or heartburn. Patient takes up to six  times daily prn.   06/13/2015 at Unknown time  . Prenatal Vit-Fe Fumarate-FA (PRENATAL MULTIVITAMIN) TABS tablet Take 1 tablet by mouth at bedtime.   06/12/2015 at Unknown time  . promethazine (PHENERGAN) 12.5 MG tablet Take 1 tablet (12.5 mg total) by mouth every 6 (six) hours as needed for nausea or vomiting. 30 tablet 0 Past Week at Unknown time  . QUEtiapine (SEROQUEL) 25 MG tablet Take 25-50 mg by mouth at bedtime. For sleep and depression   06/10/2015    Review of Systems  Constitutional: Negative.   HENT: Negative.   Eyes: Negative.   Respiratory: Negative.   Cardiovascular: Negative.   Gastrointestinal: Positive for nausea, vomiting, abdominal pain and diarrhea.  Genitourinary: Negative.   Musculoskeletal: Negative.   Skin: Negative.   Neurological: Negative.   Endo/Heme/Allergies: Negative.   Psychiatric/Behavioral: Negative.    Physical Exam   Blood pressure 114/67, pulse 90, temperature 98.3 F (36.8 C), temperature source Oral, resp. rate 18, height 5' 4.5" (1.638 m), weight 170 lb 6 oz (77.282 kg).  Physical Exam  Constitutional: She is oriented to person, place, and time. She appears well-developed and well-nourished.  HENT:  Head: Normocephalic.  Eyes: Pupils are equal, round, and reactive  to light.  Neck: Normal range of motion.  Cardiovascular: Normal rate, regular rhythm, normal heart sounds and intact distal pulses.   Respiratory: Effort normal and breath sounds normal.  GI: Soft. Bowel sounds are normal.  Genitourinary: Vagina normal and uterus normal.  Musculoskeletal: Normal range of motion.  Neurological: She is alert and oriented to person, place, and time. She has normal reflexes.  Skin: Skin is warm and dry.  Psychiatric: She has a normal mood and affect. Her behavior is normal. Judgment and thought content normal.    MAU Course  Procedures  MDM preg at 32.6 not in labor IBS Vomiting and diarrhea  Assessment and Plan  preg ant 32.6 not in  labor IBS Vomiting and diarrhea SVE ft/th/post/high. zofran 8 mg ODT introduce liquids if tolerates d/c home.  [name] 06/13/2015, 2:08 PM

## 2015-06-13 NOTE — Discharge Instructions (Signed)

## 2015-06-26 ENCOUNTER — Encounter (HOSPITAL_COMMUNITY): Payer: Self-pay | Admitting: *Deleted

## 2015-06-26 ENCOUNTER — Inpatient Hospital Stay (HOSPITAL_COMMUNITY)
Admission: AD | Admit: 2015-06-26 | Discharge: 2015-06-27 | Disposition: A | Discharge status: Home / Self Care | Payer: Medicare (Managed Care) | Source: Ambulatory Visit | Attending: Obstetrics and Gynecology | Admitting: Obstetrics and Gynecology

## 2015-06-26 DIAGNOSIS — O4703 False labor before 37 completed weeks of gestation, third trimester: Secondary | ICD-10-CM

## 2015-06-26 DIAGNOSIS — Z3A34 34 weeks gestation of pregnancy: Secondary | ICD-10-CM | POA: Diagnosis not present

## 2015-06-26 LAB — URINALYSIS, ROUTINE W REFLEX MICROSCOPIC
Bilirubin Urine: NEGATIVE
Glucose, UA: NEGATIVE mg/dL
Hgb urine dipstick: NEGATIVE
Ketones, ur: NEGATIVE mg/dL
Leukocytes, UA: NEGATIVE
Nitrite: NEGATIVE
Protein, ur: NEGATIVE mg/dL
Specific Gravity, Urine: 1.01 (ref 1.005–1.030)
Urobilinogen, UA: 0.2 mg/dL (ref 0.0–1.0)
pH: 6 (ref 5.0–8.0)

## 2015-06-26 MED ORDER — NIFEDIPINE 10 MG PO CAPS
10.0000 mg | ORAL_CAPSULE | Freq: Once | ORAL | Status: AC
Start: 2015-06-26 — End: 2015-06-26
  Administered 2015-06-26: 10 mg via ORAL
  Filled 2015-06-26: qty 1

## 2015-06-26 NOTE — MAU Note (Signed)
Pt states she is having severe pain at regular intervals, unsure if it is contractions or not. Some pinkish discharge.

## 2015-06-26 NOTE — MAU Provider Note (Signed)
History     CSN: [id number]  Arrival date and time: 06/26/15 2134   First Provider Initiated Contact with Patient 06/26/15 2240      Chief Complaint  Patient presents with  . Contractions   HPI  Ms. Holly [name] is a 39 y.o. [id number] at [redacted]w[redacted]d here with report of contractions that started shortly after 1800.  Contractions were every 5-7 minutes.  Denies vaginal bleeding or leaking of fluid.  +pink discharge.  Checked in office an found to be a fingertip.  Past Medical History  Diagnosis Date  . Migraine   . IBS (irritable bowel syndrome)   . Depression   . MRSA (methicillin resistant staph aureus) culture positive   . GERD (gastroesophageal reflux disease)     Past Surgical History  Procedure Laterality Date  . Wrist surgery      cyst removal    Family History  Problem Relation Age of Onset  . Thyroid disease Mother   . Scoliosis Mother   . Depression Mother   . Crohn's disease Father   . Autoimmune disease Father   . Factor V Leiden deficiency Father   . Heart failure Father   . Diabetes Father     History  Substance Use Topics  . Smoking status: Never Smoker   . Smokeless tobacco: Not on file  . Alcohol Use: No    Allergies:  Allergies  Allergen Reactions  . Terbutaline Palpitations  . Adhesive [Tape] Itching and Rash  . Ciprofloxacin Rash  . Zyrtec Allergy [Cetirizine Hcl] Other (See Comments)    Overly sleepy    Prescriptions prior to admission  Medication Sig Dispense Refill Last Dose  . acetaminophen (TYLENOL) 500 MG tablet Take 1,000 mg by mouth every 6 (six) hours as needed.   06/26/2015 at Unknown time  . butalbital-acetaminophen-caffeine (FIORICET WITH CODEINE) 50-325-40-30 MG per capsule Take 1 capsule by mouth every 4 (four) hours as needed for headache.   Past Week at Unknown time  . calcium carbonate (TUMS - DOSED IN MG ELEMENTAL CALCIUM) 500 MG chewable tablet Chew 1 tablet by mouth as needed for indigestion or heartburn. Patient takes  up to six times daily prn.   06/26/2015 at Unknown time  . ondansetron (ZOFRAN-ODT) 8 MG disintegrating tablet Take 1 tablet (8 mg total) by mouth every 8 (eight) hours as needed for nausea or vomiting. 20 tablet 0 two weeks at two weeks  . Prenatal Vit-Fe Fumarate-FA (PRENATAL MULTIVITAMIN) TABS tablet Take 1 tablet by mouth at bedtime.   06/25/2015 at Unknown time  . promethazine (PHENERGAN) 12.5 MG tablet Take 1 tablet (12.5 mg total) by mouth every 6 (six) hours as needed for nausea or vomiting. 30 tablet 0 two weeks at two weeks  . QUEtiapine (SEROQUEL) 25 MG tablet Take 25-50 mg by mouth at bedtime. For sleep and depression   06/25/2015 at Unknown time    Review of Systems  Eyes: Negative for blurred vision and double vision.  Gastrointestinal: Positive for nausea (with contractions earlier tonight) and abdominal pain (contractions). Negative for vomiting, diarrhea and constipation.  Genitourinary: Negative for dysuria, urgency and frequency.  Musculoskeletal: Negative for back pain.  Neurological: Positive for headaches (migraines).  All other systems reviewed and are negative.  Physical Exam   Blood pressure 120/65, pulse 81, temperature 98.6 F (37 C), temperature source Oral, resp. rate 18, height 5\' 6"  (1.676 m), weight 77.565 kg (171 lb), SpO2 99 %.  Physical Exam  Constitutional: She is oriented to  person, place, and time. She appears well-developed and well-nourished.  HENT:  Head: Normocephalic.  Neck: Normal range of motion. Neck supple.  Cardiovascular: Normal rate, regular rhythm and normal heart sounds.   Respiratory: Effort normal and breath sounds normal. No respiratory distress.  GI: Soft. There is no tenderness.  Genitourinary: No bleeding in the vagina. Vaginal discharge (mucusy) found.  Musculoskeletal: Normal range of motion. She exhibits no edema.  Neurological: She is alert and oriented to person, place, and time.  Skin: Skin is warm and dry.   FHR 140's,  +accels Toco 3-5 MAU Course  Procedures  2250 Dr. Arelia Sneddon called > Reviewed HPI/Exam/labs/tracing > give subQ terb and PO hydrate then reassess  0005 Cervical recheck 2/thick > Dr. Arelia Sneddon called with reassessment > repeat procardia (pt couldn't take terb due to prior reaction)  0140 Cervical recheck 2/thick > pt states contractions have spaced out 0145 Dr. Arelia Sneddon called > given reassessment report > discharge to home with procardia and follow-up in office this week Assessment and Plan  39 y.o. [id number] at [redacted]w[redacted]d IUP Preterm Contractions  Plan: Discharge to home RX Procardia 10 mg po prn  Follow-up in office this week  Elenora Fender [facility] 06/26/2015, 1:51 AM

## 2015-06-27 DIAGNOSIS — O4703 False labor before 37 completed weeks of gestation, third trimester: Secondary | ICD-10-CM | POA: Diagnosis not present

## 2015-06-27 MED ORDER — NIFEDIPINE 10 MG PO CAPS: 10.0000 mg | ORAL_CAPSULE | Freq: Three times a day (TID) | ORAL | Status: DC | PRN

## 2015-06-27 MED ORDER — NIFEDIPINE 10 MG PO CAPS
10.0000 mg | ORAL_CAPSULE | Freq: Once | ORAL | Status: AC
  Administered 2015-06-27: 10 mg via ORAL
  Filled 2015-06-27: qty 1

## 2015-06-27 NOTE — Discharge Instructions (Signed)

## 2015-07-04 ENCOUNTER — Inpatient Hospital Stay (HOSPITAL_COMMUNITY)
Admission: AD | Admit: 2015-07-04 | Discharge: 2015-07-04 | Disposition: A | Discharge status: Home / Self Care | Payer: Medicare (Managed Care) | Source: Ambulatory Visit | Attending: Obstetrics and Gynecology | Admitting: Obstetrics and Gynecology

## 2015-07-04 ENCOUNTER — Encounter (HOSPITAL_COMMUNITY): Payer: Self-pay | Admitting: *Deleted

## 2015-07-04 ENCOUNTER — Encounter (HOSPITAL_COMMUNITY): Payer: Self-pay

## 2015-07-04 ENCOUNTER — Inpatient Hospital Stay (HOSPITAL_COMMUNITY)
Admission: AD | Admit: 2015-07-04 | Discharge: 2015-07-04 | Disposition: A | Discharge status: Home / Self Care | Payer: Medicare (Managed Care) | Source: Ambulatory Visit | Attending: Obstetrics & Gynecology | Admitting: Obstetrics & Gynecology

## 2015-07-04 DIAGNOSIS — O4703 False labor before 37 completed weeks of gestation, third trimester: Secondary | ICD-10-CM

## 2015-07-04 DIAGNOSIS — O479 False labor, unspecified: Secondary | ICD-10-CM

## 2015-07-04 DIAGNOSIS — Z3A35 35 weeks gestation of pregnancy: Secondary | ICD-10-CM | POA: Insufficient documentation

## 2015-07-04 LAB — POCT FERN TEST: POCT Fern Test: NEGATIVE

## 2015-07-04 NOTE — MAU Note (Signed)
Patient presents 27 w and 6 d pregnant.  Patient reports possible ROM. Increasing back pressure and wet pad.  Denies vaginal bleeding or discharge. Patient reports pain with contractions.

## 2015-07-04 NOTE — MAU Note (Signed)
Pt c/o contractions every 3-4 mins since 0230. Took tylenol pm last night to try and relieve pain. Has had some nausea tonight. States she has had some spotting and changes panty liner every 2-3 hours. Denies LOF. +FM

## 2015-07-04 NOTE — Discharge Instructions (Signed)
Braxton Hicks Contractions °Contractions of the uterus can occur throughout pregnancy. Contractions are not always a sign that you are in labor.  °WHAT ARE BRAXTON HICKS CONTRACTIONS?  °Contractions that occur before labor are called Braxton Hicks contractions, or false labor. Toward the end of pregnancy (32-34 weeks), these contractions can develop more often and may become more forceful. This is not true labor because these contractions do not result in opening (dilatation) and thinning of the cervix. They are sometimes difficult to tell apart from true labor because these contractions can be forceful and people have different pain tolerances. You should not feel embarrassed if you go to the hospital with false labor. Sometimes, the only way to tell if you are in true labor is for your health care provider to look for changes in the cervix. °If there are no prenatal problems or other health problems associated with the pregnancy, it is completely safe to be sent home with false labor and await the onset of true labor. °HOW CAN YOU TELL THE DIFFERENCE BETWEEN TRUE AND FALSE LABOR? °False Labor °· The contractions of false labor are usually shorter and not as hard as those of true labor.   °· The contractions are usually irregular.   °· The contractions are often felt in the front of the lower abdomen and in the groin.   °· The contractions may go away when you walk around or change positions while lying down.   °· The contractions get weaker and are shorter lasting as time goes on.   °· The contractions do not usually become progressively stronger, regular, and closer together as with true labor.   °True Labor °· Contractions in true labor last 30-70 seconds, become very regular, usually become more intense, and increase in frequency.   °· The contractions do not go away with walking.   °· The discomfort is usually felt in the top of the uterus and spreads to the lower abdomen and low back.   °· True labor can be  determined by your health care provider with an exam. This will show that the cervix is dilating and getting thinner.   °WHAT TO REMEMBER °· Keep up with your usual exercises and follow other instructions given by your health care provider.   °· Take medicines as directed by your health care provider.   °· Keep your regular prenatal appointments.   °· Eat and drink lightly if you think you are going into labor.   °· If Braxton Hicks contractions are making you uncomfortable:   °¨ Change your position from lying down or resting to walking, or from walking to resting.   °¨ Sit and rest in a tub of warm water.   °¨ Drink 2-3 glasses of water. Dehydration may cause these contractions.   °¨ Do slow and deep breathing several times an hour.   °WHEN SHOULD I SEEK IMMEDIATE MEDICAL CARE? °Seek immediate medical care if: °· Your contractions become stronger, more regular, and closer together.   °· You have fluid leaking or gushing from your vagina.   °· You have a fever.   °· You pass blood-tinged mucus.   °· You have vaginal bleeding.   °· You have continuous abdominal pain.   °· You have low back pain that you never had before.   °· You feel your baby's head pushing down and causing pelvic pressure.   °· Your baby is not moving as much as it used to.   °Document Released: 11/05/2005 Document Revised: 11/10/2013 Document Reviewed: 08/17/2013 °[employer]® Patient Information ©2015 [employer]. This information is not intended to replace advice given to you by your health care   provider. Make sure you discuss any questions you have with your health care provider. ° °

## 2015-07-04 NOTE — MAU Provider Note (Signed)
History     CSN: [id number]  Arrival date and time: 07/04/15 0435   First Provider Initiated Contact with Patient 07/04/15 [phone]      Chief Complaint  Patient presents with  . Contractions  . Vaginal Bleeding   HPI  Holly [name] is a 39 y.o. [id number] at [redacted]w[redacted]d here with report of contractions that started around 0230.  Contractions are occurring every 3-4 minutes.  Unable to rest even after taking Tylenol PM.  +nausea, without vomiting.  Also reports spotting.  Denies LOF. +FM   Past Medical History  Diagnosis Date  . Migraine   . IBS (irritable bowel syndrome)   . Depression   . MRSA (methicillin resistant staph aureus) culture positive   . GERD (gastroesophageal reflux disease)     Past Surgical History  Procedure Laterality Date  . Wrist surgery      cyst removal    Family History  Problem Relation Age of Onset  . Thyroid disease Mother   . Scoliosis Mother   . Depression Mother   . Crohn's disease Father   . Autoimmune disease Father   . Factor V Leiden deficiency Father   . Heart failure Father   . Diabetes Father     Social History  Substance Use Topics  . Smoking status: Never Smoker   . Smokeless tobacco: None  . Alcohol Use: No    Allergies:  Allergies  Allergen Reactions  . Terbutaline Palpitations  . Adhesive [Tape] Itching and Rash  . Ciprofloxacin Rash  . Zyrtec Allergy [Cetirizine Hcl] Other (See Comments)    Overly sleepy    Prescriptions prior to admission  Medication Sig Dispense Refill Last Dose  . acetaminophen (TYLENOL) 500 MG tablet Take 1,000 mg by mouth every 6 (six) hours as needed.   07/03/2015 at Unknown time  . calcium carbonate (TUMS - DOSED IN MG ELEMENTAL CALCIUM) 500 MG chewable tablet Chew 1 tablet by mouth as needed for indigestion or heartburn. Patient takes up to six times daily prn.   07/04/2015 at Unknown time  . Prenatal Vit-Fe Fumarate-FA (PRENATAL MULTIVITAMIN) TABS tablet Take 1 tablet by mouth at  bedtime.   07/03/2015 at Unknown time  . QUEtiapine (SEROQUEL) 25 MG tablet Take 25-50 mg by mouth at bedtime as needed (for sleep). For sleep and depression   Past Week at Unknown time  . butalbital-acetaminophen-caffeine (FIORICET WITH CODEINE) 50-325-40-30 MG per capsule Take 1 capsule by mouth every 4 (four) hours as needed for headache.   prn  . NIFEdipine (PROCARDIA) 10 MG capsule Take 1 capsule (10 mg total) by mouth 3 (three) times daily as needed. (Patient not taking: Reported on 07/04/2015) 30 capsule 1   . ondansetron (ZOFRAN-ODT) 8 MG disintegrating tablet Take 1 tablet (8 mg total) by mouth every 8 (eight) hours as needed for nausea or vomiting. (Patient not taking: Reported on 07/04/2015) 20 tablet 0 two weeks at two weeks    Review of Systems  Constitutional: Negative for fever.  Gastrointestinal: Positive for nausea and abdominal pain (contractions). Negative for vomiting.  Genitourinary:       Spotting of blood  All other systems reviewed and are negative.  Physical Exam   Blood pressure 116/68, pulse 71, temperature 98.6 F (37 C), temperature source Oral, resp. rate 18, height  (1.676 m), weight 78.019 kg (172 lb), SpO2 99 %.  Physical Exam  Constitutional: She is oriented to person, place, and time. She appears well-developed and well-nourished.  HENT:  Head: Normocephalic.  Neck: Normal range of motion. Neck supple.  Cardiovascular: Normal rate, regular rhythm and normal heart sounds.   Respiratory: Effort normal and breath sounds normal. No respiratory distress.  GI: Soft. There is no tenderness.  Genitourinary: No bleeding in the vagina. Vaginal discharge (mucusy) found.  Musculoskeletal: Normal range of motion. She exhibits no edema.  Neurological: She is alert and oriented to person, place, and time.  Skin: Skin is warm and dry.   MAU Course  Procedures  MDM 0515 Cervical check 3.5/50/-3 (no blood seen in vagina or on glove) FHR 130's, +accels; Toco  2-3.5  0715 Cervical check 3.5/50/-3  0730 Consulted with Dr. Langston Masker > Reviewed HPI/Exam/pt concern that delivery is soon approaching > may observe an additional hour and then recheck.  0800 Report given to J. Rasch who assumes care of patient.  Rochele Pages N 07/04/2015, 8:00 AM  Assessment and Plan

## 2015-07-04 NOTE — MAU Provider Note (Signed)
Subjective:   [name] is a 39 y.o. female [phone] at [redacted]w[redacted]d here with possible ROM. She presented this morning with contractions; she was here for 4 hours without cervical change. She left here and shortly after felt like her pad was more wet than usual. She has a 30 minute drive home and just wants to confirm whether her water is broke.   + fetal movement Continues to have mucus like, bloody show and irregular contractions.   Objective:   GENERAL: Well-developed, well-nourished female in no acute distress.  LUNGS: Effort normal SKIN: Warm, dry and without erythema PSYCH: Normal mood and affect.  Filed Vitals:   07/04/15 1130  BP: 122/71  Pulse: 96  Temp:   Resp: 18   MDM:  Speculum exam: Vagina - Small amount of creamy, pink mucus like discharge, no odor Cervix - No contact bleeding, no active bleeding noted.  Bimanual exam: Per RN Chaperone present for exam.    A:  Vaginal discharge with bloody show. Unlikely ROM Fern negative  P:   RN to call Dr. Langston Masker with results.    Duane Lope, NP 07/04/2015 11:31 AM

## 2015-07-04 NOTE — Discharge Instructions (Signed)

## 2015-07-07 ENCOUNTER — Encounter (HOSPITAL_COMMUNITY): Payer: Self-pay | Admitting: Emergency Medicine

## 2015-07-07 ENCOUNTER — Inpatient Hospital Stay (HOSPITAL_COMMUNITY)
Admission: AD | Admit: 2015-07-07 | Discharge: 2015-07-09 | DRG: 775 | Disposition: A | Discharge status: Home / Self Care | Payer: Medicare (Managed Care) | Source: Ambulatory Visit | Attending: Obstetrics and Gynecology | Admitting: Obstetrics and Gynecology

## 2015-07-07 DIAGNOSIS — Z3A36 36 weeks gestation of pregnancy: Secondary | ICD-10-CM | POA: Diagnosis present

## 2015-07-07 DIAGNOSIS — O9962 Diseases of the digestive system complicating childbirth: Secondary | ICD-10-CM | POA: Diagnosis present

## 2015-07-07 DIAGNOSIS — O09523 Supervision of elderly multigravida, third trimester: Secondary | ICD-10-CM

## 2015-07-07 DIAGNOSIS — K219 Gastro-esophageal reflux disease without esophagitis: Secondary | ICD-10-CM | POA: Diagnosis present

## 2015-07-07 DIAGNOSIS — Z833 Family history of diabetes mellitus: Secondary | ICD-10-CM

## 2015-07-07 DIAGNOSIS — Z8249 Family history of ischemic heart disease and other diseases of the circulatory system: Secondary | ICD-10-CM

## 2015-07-07 LAB — RPR: RPR Ser Ql: NONREACTIVE

## 2015-07-07 LAB — RAPID HIV SCREEN (HIV 1/2 AB+AG)
HIV 1/2 Antibodies: NONREACTIVE
HIV-1 P24 Antigen - HIV24: NONREACTIVE

## 2015-07-07 LAB — CBC
HCT: 28.9 % — ABNORMAL LOW (ref 36.0–46.0)
Hemoglobin: 9.6 g/dL — ABNORMAL LOW (ref 12.0–15.0)
MCH: 30 pg (ref 26.0–34.0)
MCHC: 33.2 g/dL (ref 30.0–36.0)
MCV: 90.3 fL (ref 78.0–100.0)
Platelets: 190 10*3/uL (ref 150–400)
RBC: 3.2 MIL/uL — ABNORMAL LOW (ref 3.87–5.11)
RDW: 14 % (ref 11.5–15.5)
WBC: 14.1 10*3/uL — ABNORMAL HIGH (ref 4.0–10.5)

## 2015-07-07 MED ORDER — DIBUCAINE 1 % RE OINT
1.0000 "application " | TOPICAL_OINTMENT | RECTAL | Status: DC | PRN
  Filled 2015-07-07: qty 28

## 2015-07-07 MED ORDER — ACETAMINOPHEN 325 MG PO TABS: 650.0000 mg | ORAL_TABLET | ORAL | Status: DC | PRN

## 2015-07-07 MED ORDER — LACTATED RINGERS IV SOLN: 500.0000 mL | INTRAVENOUS | Status: DC | PRN

## 2015-07-07 MED ORDER — OXYTOCIN 10 UNIT/ML IJ SOLN
10.0000 [IU] | Freq: Once | INTRAMUSCULAR | Status: AC
  Administered 2015-07-07: 10 [IU] via INTRAMUSCULAR

## 2015-07-07 MED ORDER — LANOLIN HYDROUS EX OINT: TOPICAL_OINTMENT | CUTANEOUS | Status: DC | PRN

## 2015-07-07 MED ORDER — OXYTOCIN 40 UNITS IN LACTATED RINGERS INFUSION - SIMPLE MED
INTRAVENOUS | Status: AC
  Filled 2015-07-07: qty 1000

## 2015-07-07 MED ORDER — SIMETHICONE 80 MG PO CHEW: 80.0000 mg | CHEWABLE_TABLET | ORAL | Status: DC | PRN

## 2015-07-07 MED ORDER — LIDOCAINE HCL (PF) 1 % IJ SOLN
INTRAMUSCULAR | Status: AC
  Filled 2015-07-07: qty 30

## 2015-07-07 MED ORDER — OXYCODONE-ACETAMINOPHEN 5-325 MG PO TABS: 1.0000 | ORAL_TABLET | ORAL | Status: DC | PRN

## 2015-07-07 MED ORDER — TETANUS-DIPHTH-ACELL PERTUSSIS 5-2.5-18.5 LF-MCG/0.5 IM SUSP: 0.5000 mL | Freq: Once | INTRAMUSCULAR | Status: DC

## 2015-07-07 MED ORDER — LIDOCAINE HCL (PF) 1 % IJ SOLN: 30.0000 mL | INTRAMUSCULAR | Status: DC | PRN

## 2015-07-07 MED ORDER — IBUPROFEN 600 MG PO TABS
600.0000 mg | ORAL_TABLET | Freq: Four times a day (QID) | ORAL | Status: DC
  Administered 2015-07-07 – 2015-07-09 (×10): 600 mg via ORAL
  Filled 2015-07-07 (×10): qty 1

## 2015-07-07 MED ORDER — MEASLES, MUMPS & RUBELLA VAC ~~LOC~~ INJ
0.5000 mL | INJECTION | Freq: Once | SUBCUTANEOUS | Status: DC
  Filled 2015-07-07: qty 0.5

## 2015-07-07 MED ORDER — ONDANSETRON HCL 4 MG PO TABS
4.0000 mg | ORAL_TABLET | ORAL | Status: DC | PRN
  Administered 2015-07-07 – 2015-07-08 (×2): 4 mg via ORAL
  Filled 2015-07-07 (×2): qty 1

## 2015-07-07 MED ORDER — MEDROXYPROGESTERONE ACETATE 150 MG/ML IM SUSP: 150.0000 mg | INTRAMUSCULAR | Status: DC | PRN

## 2015-07-07 MED ORDER — LACTATED RINGERS IV SOLN: INTRAVENOUS | Status: DC

## 2015-07-07 MED ORDER — ZOLPIDEM TARTRATE 5 MG PO TABS: 5.0000 mg | ORAL_TABLET | Freq: Every evening | ORAL | Status: DC | PRN

## 2015-07-07 MED ORDER — CITRIC ACID-SODIUM CITRATE 334-500 MG/5ML PO SOLN: 30.0000 mL | ORAL | Status: DC | PRN

## 2015-07-07 MED ORDER — BENZOCAINE-MENTHOL 20-0.5 % EX AERO
1.0000 "application " | INHALATION_SPRAY | CUTANEOUS | Status: DC | PRN
  Filled 2015-07-07: qty 56

## 2015-07-07 MED ORDER — OXYCODONE-ACETAMINOPHEN 5-325 MG PO TABS: 2.0000 | ORAL_TABLET | ORAL | Status: DC | PRN

## 2015-07-07 MED ORDER — FLEET ENEMA 7-19 GM/118ML RE ENEM: 1.0000 | ENEMA | RECTAL | Status: DC | PRN

## 2015-07-07 MED ORDER — BISACODYL 10 MG RE SUPP
10.0000 mg | Freq: Every day | RECTAL | Status: DC | PRN
Start: 2015-07-07 — End: 2015-07-09
  Filled 2015-07-07: qty 1

## 2015-07-07 MED ORDER — AMPICILLIN SODIUM 2 G IJ SOLR
2.0000 g | Freq: Once | INTRAMUSCULAR | Status: DC
Start: 2015-07-07 — End: 2015-07-07
  Filled 2015-07-07: qty 2000

## 2015-07-07 MED ORDER — SENNOSIDES-DOCUSATE SODIUM 8.6-50 MG PO TABS
2.0000 | ORAL_TABLET | ORAL | Status: DC
  Administered 2015-07-07 – 2015-07-09 (×2): 2 via ORAL
  Filled 2015-07-07 (×2): qty 2

## 2015-07-07 MED ORDER — ONDANSETRON HCL 4 MG/2ML IJ SOLN: 4.0000 mg | Freq: Four times a day (QID) | INTRAMUSCULAR | Status: DC | PRN

## 2015-07-07 MED ORDER — PRENATAL MULTIVITAMIN CH
1.0000 | ORAL_TABLET | Freq: Every day | ORAL | Status: DC
  Administered 2015-07-07 – 2015-07-09 (×3): 1 via ORAL
  Filled 2015-07-07 (×3): qty 1

## 2015-07-07 MED ORDER — OXYTOCIN BOLUS FROM INFUSION: 500.0000 mL | INTRAVENOUS | Status: DC

## 2015-07-07 MED ORDER — WITCH HAZEL-GLYCERIN EX PADS: 1.0000 "application " | MEDICATED_PAD | CUTANEOUS | Status: DC | PRN

## 2015-07-07 MED ORDER — FLEET ENEMA 7-19 GM/118ML RE ENEM: 1.0000 | ENEMA | Freq: Every day | RECTAL | Status: DC | PRN

## 2015-07-07 MED ORDER — DIPHENHYDRAMINE HCL 25 MG PO CAPS: 25.0000 mg | ORAL_CAPSULE | Freq: Four times a day (QID) | ORAL | Status: DC | PRN

## 2015-07-07 MED ORDER — ONDANSETRON HCL 4 MG/2ML IJ SOLN: 4.0000 mg | INTRAMUSCULAR | Status: DC | PRN

## 2015-07-07 MED ORDER — OXYTOCIN 40 UNITS IN LACTATED RINGERS INFUSION - SIMPLE MED: 62.5000 mL/h | INTRAVENOUS | Status: DC

## 2015-07-07 MED ORDER — OXYCODONE-ACETAMINOPHEN 5-325 MG PO TABS
1.0000 | ORAL_TABLET | ORAL | Status: DC | PRN
  Administered 2015-07-07 – 2015-07-08 (×6): 1 via ORAL
  Filled 2015-07-07 (×6): qty 1

## 2015-07-07 MED ORDER — OXYTOCIN 10 UNIT/ML IJ SOLN
INTRAMUSCULAR | Status: AC
  Administered 2015-07-07: 10 [IU] via INTRAMUSCULAR
  Filled 2015-07-07: qty 1

## 2015-07-07 NOTE — H&P (Signed)
Holly [name] is a 39 y.o. G 6 P 2 presents in active labor. Was 8 cm in MAU.  1 minute after arrival to [name] and D patient delivered in bed. Maternal Medical History:  Reason for admission: Rupture of membranes and contractions.     OB History    Gravida Para Term Preterm AB TAB SAB Ectopic Multiple Living   Past Medical History  Diagnosis Date  . Migraine   . IBS (irritable bowel syndrome)   . Depression   . MRSA (methicillin resistant staph aureus) culture positive   . GERD (gastroesophageal reflux disease)    Past Surgical History  Procedure Laterality Date  . Wrist surgery      cyst removal   Family History: family history includes Autoimmune disease in her father; Crohn's disease in her father; Depression in her mother; Diabetes in her father; Factor V Leiden deficiency in her father; Heart failure in her father; Scoliosis in her mother; Thyroid disease in her mother. Social History:  reports that she has never smoked. She does not have any smokeless tobacco history on file. She reports that she does not drink alcohol or use illicit drugs.   Prenatal Transfer Tool  Maternal Diabetes: No Genetic Screening: Normal Maternal Ultrasounds/Referrals: Normal Fetal Ultrasounds or other Referrals:  None Maternal Substance Abuse:  No Significant Maternal Medications:  None Significant Maternal Lab Results:  None Other Comments:  None  Review of Systems  All other systems reviewed and are negative.   Dilation: 8 Effacement (%): 100 Station: -1 Exam by:: Weston,RN Temperature 98.2 F (36.8 C), temperature source Oral. Maternal Exam:  Abdomen: Fetal presentation: vertex     Fetal Exam Fetal State Assessment: Category I - tracings are normal.     Physical Exam  Nursing note and vitals reviewed. Constitutional: She appears well-developed.  HENT:  Head: Normocephalic.  Eyes: Pupils are equal, round, and reactive to light.  Cardiovascular:  Normal rate and regular rhythm.     Prenatal labs: ABO, Rh: A/Positive/-- (02/09 0000) Antibody: Negative (02/09 0000) Rubella: Immune (02/09 0000) RPR: Nonreactive (02/09 0000)  HBsAg: Negative (02/09 0000)  HIV: Non-reactive (02/09 0000)  GBS:     Assessment/Plan: NSVD  Routine Postpartum care   Holly [name] 07/07/2015, 1:40 AM

## 2015-07-07 NOTE — Progress Notes (Signed)
Post Partum Day 0 Subjective: no complaints, up ad lib, voiding and tolerating PO  Objective: Blood pressure 107/57, pulse 79, temperature 98.6 F (37 C), temperature source Oral, resp. rate 18, unknown if currently breastfeeding.  Physical Exam:  General: alert and cooperative Lochia: appropriate Uterine Fundus: firm Incision: perineum intact DVT Evaluation: No evidence of DVT seen on physical exam. Negative Homan's sign. No cords or calf tenderness. No significant calf/ankle edema.   Recent Labs  07/07/15 0535  HGB 9.6*  HCT 28.9*    Assessment/Plan: Plan for discharge tomorrow   LOS: 0 days   Barney Gertsch G 07/07/2015, 8:08 AM

## 2015-07-07 NOTE — Lactation Note (Signed)
This note was copied from the chart of Holly [name]. Lactation Consultation Note  P6, Ex BF, BF all children ranges of 1 yr to 22 months.  States this is her 3rd late preterm infant.  She states she is well aware of challenges of bf LPI. DEBP is set up in her room.  Recommend she post pump a few times a day.  Mother states in the past she had an overabundance of breastmilk. Reviewed LPI feeding behavior.  Provided information sheet.  Mother states she understands. Mother states she has noted swallows.  Suggest she call LC to view latch w/ next feeding. Mom made aware of O/P services, breastfeeding support groups, community resources, and our phone # for post-discharge questions.    Patient Name: Holly [name] [id number] Date: 07/07/2015 Reason for consult: Late preterm infant   Maternal Data    Feeding Feeding Type: Breast Fed Length of feed: 20 min  LATCH Score/Interventions Latch: Grasps breast easily, tongue down, lips flanged, rhythmical sucking.  Audible Swallowing: A few with stimulation Intervention(s): Skin to skin  Type of Nipple: Everted at rest and after stimulation  Comfort (Breast/Nipple): Soft / non-tender     Hold (Positioning): No assistance needed to correctly position infant at breast.  LATCH Score: 9  Lactation Tools Discussed/Used     Consult Status Consult Status: Follow-up Date: 07/08/15 Follow-up type: In-patient    Dahlia Byes [facility] 07/07/2015, 12:10 PM

## 2015-07-07 NOTE — Progress Notes (Signed)
UR chart review completed.  

## 2015-07-07 NOTE — Progress Notes (Signed)
Pt up to room, after in bed for maybe one minute pt stated that she felt like the baby was coming, RN turned to get gloves and as RN was putting on gloves baby delivered onto the bed with no assistance.

## 2015-07-08 NOTE — Progress Notes (Signed)
Post Partum Day 1 Subjective: up ad lib, voiding, tolerating PO and c/o fatigue  Objective: Blood pressure 104/55, pulse 72, temperature 98.3 F (36.8 C), temperature source Oral, resp. rate 18, SpO2 97 %, unknown if currently breastfeeding.  Physical Exam:  General: alert and cooperative Lochia: appropriate Uterine Fundus: firm Incision: healing well DVT Evaluation: No evidence of DVT seen on physical exam. Negative Homan's sign. No cords or calf tenderness. No significant calf/ankle edema.   Recent Labs  07/07/15 0535  HGB 9.6*  HCT 28.9*    Assessment/Plan: Plan for discharge tomorrow   LOS: 1 day   Joandy Burget G 07/08/2015, 8:17 AM

## 2015-07-08 NOTE — Lactation Note (Signed)
This note was copied from the chart of Holly [name]. Lactation Consultation Note  Patient Name: Holly Deby Adger [id number] Date: 07/08/2015 Reason for consult: Follow-up assessment  With this mom of a LPI, now 60 hours old, and 36 3/7 weeks CGA. Mom concerned that the baby is sleepy at the breast. I reviewed with mom that the baby is early, and after a day of brestfeeding, LPI often get sleepy. I advised her to limit time at breast to 15 minutes, and alternate breasts with each feeding. Then offer EBM in bottle as supplement, prior to formula. Baby can now have up to 20 ml's suppement, and by day of life 3, 30 ml's plus. I advised mom tot feed the baby at least every 3 hours, and more often if showing hunger cues. And then, mom should pump in premie setting, and ave that milk for next feeding. I reviewed with mom how to set premie setting. Mom was given a manual hand pump, and paper work to do a 2 week rental, until she gets her DEP through insurance.    Maternal Data    Feeding Feeding Type: Breast Fed Length of feed: 20 min  LATCH Score/Interventions Latch: Repeated attempts needed to sustain latch, nipple held in mouth throughout feeding, stimulation needed to elicit sucking reflex. Intervention(s): Adjust position;Assist with latch  Audible Swallowing: A few with stimulation  Type of Nipple: Everted at rest and after stimulation  Comfort (Breast/Nipple): Soft / non-tender     Hold (Positioning): Assistance needed to correctly position infant at breast and maintain latch. Intervention(s): Breastfeeding basics reviewed;Support Pillows;Position options;Skin to skin  LATCH Score: 7  Lactation Tools Discussed/Used Pump Review: Setup, frequency, and cleaning;Milk Storage;Other (comment) (premie setting, triple feeding) Date initiated:: 07/07/15   Consult Status Consult Status: Follow-up Date: 07/09/15 Follow-up type: In-patient    Alfred Levins 07/08/2015,  3:47 PM

## 2015-07-08 NOTE — Progress Notes (Signed)
MOB was referred for history of depression/anxiety.  Referral is screened out by Clinical Social Worker because none of the following criteria appear to apply: -History of anxiety/depression during this pregnancy, or of post-partum depression. - Diagnosis of anxiety and/or depression within last 3 years or -MOB's symptoms are currently being treated with medication and/or therapy.  CSW briefly met with the MOB.  MOB presents with knowledge and self-awareness related to her mental health.  She stated that she has a history of depression and postpartum depression, and has previously been prescribed Zoloft. She shared that the Zoloft was discontinued when she learned of the pregnancy, but is currently prescribed a low dose of Seroquel as a sleep aid, but also for assistance with mood symptoms. She stated that she already has a prescription for Zoloft at home (from her PCP), and voiced intention to continue to work with her therapist of numerous years.  MOB discussed that she is acutely aware of need to monitor her mental health since she is aware of potential outcomes on herself and her children if symptoms are left untreated. MOB shared belief that she cannot care for her children unless she cares for herself.  MOB reported feeling comfortable and confident with her current plan to address her mental health, expressed appreciation for the visit, and agreed to contact CSW if additional needs arise.  Please contact the Clinical Social Worker if needs arise or upon MOB request.

## 2015-07-09 MED ORDER — IBUPROFEN 600 MG PO TABS: 600.0000 mg | ORAL_TABLET | Freq: Four times a day (QID) | ORAL | Status: DC

## 2015-07-09 MED ORDER — OXYCODONE-ACETAMINOPHEN 5-325 MG PO TABS: 1.0000 | ORAL_TABLET | ORAL | Status: DC | PRN

## 2015-07-09 NOTE — Discharge Summary (Signed)
Obstetric Discharge Summary Reason for Admission: onset of labor Prenatal Procedures: none Intrapartum Procedures: spontaneous vaginal delivery Postpartum Procedures: none Complications-Operative and Postpartum: none HEMOGLOBIN  Date Value Ref Range Status  07/07/2015 9.6* 12.0 - 15.0 g/dL Final   HCT  Date Value Ref Range Status  07/07/2015 28.9* 36.0 - 46.0 % Final    Physical Exam:  General: alert Lochia: appropriate Uterine Fundus: firm Incision: healing well DVT Evaluation: No evidence of DVT seen on physical exam.  Discharge Diagnoses: Term Pregnancy-delivered  Discharge Information: Date: 07/09/2015 Activity: pelvic rest Diet: routine Medications: PNV, Ibuprofen and Percocet Condition: stable Instructions: refer to practice specific booklet Discharge to: home Follow-up Information    Follow up with Meriel Pica, MD. Schedule an appointment as soon as possible for a visit in 6 weeks.   Specialty:  Obstetrics and Gynecology   Contact information:   [address] [place] [address] [phone]       Newborn Data: Live born female  Birth Weight: 6 lb 5.6 oz (2880 g) APGAR: 8, 7  Home with mother.  Meriel Pica 07/09/2015, 7:46 AM

## 2015-07-09 NOTE — Lactation Note (Signed)
This note was copied from the chart of Holly Serai Wuest. Lactation Consultation Note  Patient Name: Holly [name] FAOZH'Y Date: 07/09/2015 Reason for consult: Follow-up assessment 36 2/[redacted] week Gestation  Baby is 70 hours old and is for D/C , 8 % weight loss, Breast feeding  Consistently 10 -20 mins , and supplemented with EBM x3 , milk is in  And formula . Latch scores 8-9 , 7-9  Voids and Stools QS for age, Bili at 46 hours - 9.3 oz  Per mom baby recently fed at 1130 for 15 mins and supplemented back with 18 ml of EBM per mom. LC reviewed LC plan for a late preterm infant less than 6 pounds with 8% weight loss and the need for extra pumping ( post)  And feeding at least every 3 hours. Per mom I feel comfortable with the plan and pretty much have been doing it since last night.  Mom rented a DEBP with instructions. Mom had been pumping with the [facility] in the hospital. Is aware the rental is just standard mode.  Sore nipples and engorgement prevention and tx reviewed.  Mom receptive to teaching and plan. Mother informed of post-discharge support and given phone number to the lactation department, including services for phone call assistance; out-patient appointments; and breastfeeding support group. List of other breastfeeding resources in the community given in the handout. Encouraged mother to call for problems or concerns related to breastfeeding.   Maternal Data    Feeding Feeding Type: Breast Fed Nipple Type: Slow - flow Length of feed: 15 min (per mom )  LATCH Score/Interventions                Intervention(s): Breastfeeding basics reviewed     Lactation Tools Discussed/Used     Consult Status Consult Status: Complete Date: 07/09/15    Holly [name] 07/09/2015, 12:37 PM

## 2015-08-09 ENCOUNTER — Ambulatory Visit (HOSPITAL_COMMUNITY): Payer: Medicare (Managed Care) | Admitting: Psychiatry

## 2015-08-17 ENCOUNTER — Encounter (HOSPITAL_COMMUNITY)
Admission: RE | Admit: 2015-08-17 | Discharge: 2015-08-17 | Disposition: A | Discharge status: Home / Self Care | Payer: Medicare (Managed Care) | Source: Ambulatory Visit | Attending: Obstetrics & Gynecology | Admitting: Obstetrics & Gynecology

## 2015-09-16 ENCOUNTER — Encounter (HOSPITAL_COMMUNITY)
Admission: RE | Admit: 2015-09-16 | Discharge: 2015-09-16 | Disposition: A | Discharge status: Home / Self Care | Payer: Medicare (Managed Care) | Source: Ambulatory Visit | Attending: Obstetrics & Gynecology | Admitting: Obstetrics & Gynecology

## 2015-09-22 ENCOUNTER — Emergency Department (INDEPENDENT_AMBULATORY_CARE_PROVIDER_SITE_OTHER): Payer: 59

## 2015-09-22 ENCOUNTER — Encounter: Payer: Self-pay | Admitting: Emergency Medicine

## 2015-09-22 ENCOUNTER — Emergency Department
Admission: EM | Admit: 2015-09-22 | Discharge: 2015-09-22 | Disposition: A | Discharge status: Home / Self Care | Payer: 59 | Source: Home / Self Care | Attending: Family Medicine | Admitting: Family Medicine

## 2015-09-22 DIAGNOSIS — M545 Low back pain: Secondary | ICD-10-CM | POA: Diagnosis not present

## 2015-09-22 DIAGNOSIS — R202 Paresthesia of skin: Secondary | ICD-10-CM

## 2015-09-22 DIAGNOSIS — S335XXA Sprain of ligaments of lumbar spine, initial encounter: Secondary | ICD-10-CM

## 2015-09-22 MED ORDER — HYDROCODONE-ACETAMINOPHEN 5-325 MG PO TABS: ORAL_TABLET | ORAL | Status: DC

## 2015-09-22 NOTE — Discharge Instructions (Signed)
Apply ice pack for 20 to 30 minutes, 3 to 4 times daily  Continue until pain decreases.  May take Ibuprofen 200mg , 4 tabs every 8 hours with food.  May use a back brace for about one week.  Begin back exercises as tolerated   Back Exercises The following exercises strengthen the muscles that help to support the back. They also help to keep the lower back flexible. Doing these exercises can help to prevent back pain or lessen existing pain. If you have back pain or discomfort, try doing these exercises 2-3 times each day or as told by your health care provider. When the pain goes away, do them once each day, but increase the number of times that you repeat the steps for each exercise (do more repetitions). If you do not have back pain or discomfort, do these exercises once each day or as told by your health care provider. EXERCISES Single Knee to Chest Repeat these steps 3-5 times for each leg: 1. Lie on your back on a firm bed or the floor with your legs extended. 2. Bring one knee to your chest. Your other leg should stay extended and in contact with the floor. 3. Hold your knee in place by grabbing your knee or thigh. 4. Pull on your knee until you feel a gentle stretch in your lower back. 5. Hold the stretch for 10-30 seconds. 6. Slowly release and straighten your leg. Pelvic Tilt Repeat these steps 5-10 times: 1. Lie on your back on a firm bed or the floor with your legs extended. 2. Bend your knees so they are pointing toward the ceiling and your feet are flat on the floor. 3. Tighten your lower abdominal muscles to press your lower back against the floor. This motion will tilt your pelvis so your tailbone points up toward the ceiling instead of pointing to your feet or the floor. 4. With gentle tension and even breathing, hold this position for 5-10 seconds. Cat-Cow Repeat these steps until your lower back becomes more flexible: 1. Get into a hands-and-knees position on a firm surface.  Keep your hands under your shoulders, and keep your knees under your hips. You may place padding under your knees for comfort. 2. Let your head hang down, and point your tailbone toward the floor so your lower back becomes rounded like the back of a cat. 3. Hold this position for 5 seconds. 4. Slowly lift your head and point your tailbone up toward the ceiling so your back forms a sagging arch like the back of a cow. 5. Hold this position for 5 seconds. Press-Ups Repeat these steps 5-10 times: 1. Lie on your abdomen (face-down) on the floor. 2. Place your palms near your head, about shoulder-width apart. 3. While you keep your back as relaxed as possible and keep your hips on the floor, slowly straighten your arms to raise the top half of your body and lift your shoulders. Do not use your back muscles to raise your upper torso. You may adjust the placement of your hands to make yourself more comfortable. 4. Hold this position for 5 seconds while you keep your back relaxed. 5. Slowly return to lying flat on the floor. Bridges Repeat these steps 10 times: 1. Lie on your back on a firm surface. 2. Bend your knees so they are pointing toward the ceiling and your feet are flat on the floor. 3. Tighten your buttocks muscles and lift your buttocks off of the floor until your  waist is at almost the same height as your knees. You should feel the muscles working in your buttocks and the back of your thighs. If you do not feel these muscles, slide your feet 1-2 inches farther away from your buttocks. 4. Hold this position for 3-5 seconds. 5. Slowly lower your hips to the starting position, and allow your buttocks muscles to relax completely. If this exercise is too easy, try doing it with your arms crossed over your chest. Abdominal Crunches Repeat these steps 5-10 times: 1. Lie on your back on a firm bed or the floor with your legs extended. 2. Bend your knees so they are pointing toward the ceiling and  your feet are flat on the floor. 3. Cross your arms over your chest. 4. Tip your chin slightly toward your chest without bending your neck. 5. Tighten your abdominal muscles and slowly raise your trunk (torso) high enough to lift your shoulder blades a tiny bit off of the floor. Avoid raising your torso higher than that, because it can put too much stress on your low back and it does not help to strengthen your abdominal muscles. 6. Slowly return to your starting position. Back Lifts Repeat these steps 5-10 times: 1. Lie on your abdomen (face-down) with your arms at your sides, and rest your forehead on the floor. 2. Tighten the muscles in your legs and your buttocks. 3. Slowly lift your chest off of the floor while you keep your hips pressed to the floor. Keep the back of your head in line with the curve in your back. Your eyes should be looking at the floor. 4. Hold this position for 3-5 seconds. 5. Slowly return to your starting position. SEEK MEDICAL CARE IF:  Your back pain or discomfort gets much worse when you do an exercise.  Your back pain or discomfort does not lessen within 2 hours after you exercise. If you have any of these problems, stop doing these exercises right away. Do not do them again unless your health care provider says that you can. SEEK IMMEDIATE MEDICAL CARE IF:  You develop sudden, severe back pain. If this happens, stop doing the exercises right away. Do not do them again unless your health care provider says that you can.   This information is not intended to replace advice given to you by your health care provider. Make sure you discuss any questions you have with your health care provider.   Document Released: 12/13/2004 Document Revised: 07/27/2015 Document Reviewed: 12/30/2014 Elsevier Interactive Patient Education [employer]

## 2015-09-22 NOTE — ED Provider Notes (Signed)
CSN: [id number]     Arrival date & time 09/22/15  1813 History   First MD Initiated Contact with Patient 09/22/15 1831     Chief Complaint  Patient presents with  . Back Pain      HPI Comments: While bending over two hours ago to place her infant in a car seat, patient felt sudden sharp pain in her lower back.  She has now developed an electric shock like radiation into her right posterior thigh.   She denies bowel or bladder dysfunction, and no saddle numbness.    Patient is a 39 y.o. female presenting with back pain. The history is provided by the patient.  Back Pain Location:  Lumbar spine Quality:  Stabbing Radiates to:  R posterior upper leg Pain severity:  Severe Pain is:  Same all the time Onset quality:  Sudden Duration:  2 hours Timing:  Constant Progression:  Unchanged Chronicity:  New Context: lifting heavy objects   Relieved by:  Lying down Worsened by:  Standing Ineffective treatments:  Heating pad Associated symptoms: paresthesias   Associated symptoms: no abdominal pain, no bladder incontinence, no bowel incontinence, no dysuria, no fever, no leg pain, no numbness, no pelvic pain, no perianal numbness and no weakness     Past Medical History  Diagnosis Date  . Migraine   . IBS (irritable bowel syndrome)   . Depression   . MRSA (methicillin resistant staph aureus) culture positive   . GERD (gastroesophageal reflux disease)    Past Surgical History  Procedure Laterality Date  . Wrist surgery      cyst removal   Family History  Problem Relation Age of Onset  . Thyroid disease Mother   . Scoliosis Mother   . Depression Mother   . Crohn's disease Father   . Autoimmune disease Father   . Factor V Leiden deficiency Father   . Heart failure Father   . Diabetes Father    Social History  Substance Use Topics  . Smoking status: Never Smoker   . Smokeless tobacco: None  . Alcohol Use: No   OB History    Gravida Para Term Preterm AB TAB SAB Ectopic  Multiple Living   6 6 2 4      0 6     Review of Systems  Constitutional: Negative for fever.  Gastrointestinal: Negative for abdominal pain and bowel incontinence.  Genitourinary: Negative for bladder incontinence, dysuria and pelvic pain.  Musculoskeletal: Positive for back pain.  Neurological: Positive for paresthesias. Negative for weakness and numbness.  All other systems reviewed and are negative.   Allergies  Terbutaline; Adhesive; Ciprofloxacin; and Zyrtec allergy  Home Medications   Prior to Admission medications   Medication Sig Start Date End Date Taking? Authorizing Provider  ranitidine (ZANTAC) 150 MG tablet Take 150 mg by mouth 2 (two) times daily.   Yes Historical Provider, MD  HYDROcodone-acetaminophen (NORCO/VICODIN) 5-325 MG tablet Take one-half to one tab by mouth at bedtime as needed for pain 09/22/15   Lattie HawStephen A Beese, MD  ibuprofen (ADVIL,MOTRIN) 600 MG tablet Take 1 tablet (600 mg total) by mouth every 6 (six) hours. 07/09/15   Richarda Overlieichard Holland, MD  oxyCODONE-acetaminophen (PERCOCET/ROXICET) 5-325 MG per tablet Take 1 tablet by mouth every 4 (four) hours as needed (for pain scale 4-7). 07/09/15   Richarda Overlieichard Holland, MD  Prenatal Vit-Fe Fumarate-FA (PRENATAL MULTIVITAMIN) TABS tablet Take 1 tablet by mouth at bedtime.    Historical Provider, MD   Meds Ordered and Administered this  Visit  Medications - No data to display  BP 135/83 mmHg  Pulse 67  Temp(Src) 98.4 F (36.9 C) (Oral)  Ht  (1.676 m)  Wt 157 lb (71.215 kg)  BMI 25.35 kg/m2  SpO2 98%  Breastfeeding? Yes No data found.   Physical Exam  Constitutional: She is oriented to person, place, and time. She appears well-developed and well-nourished. No distress.  HENT:  Head: Normocephalic.  Eyes: Pupils are equal, round, and reactive to light.  Neck: Normal range of motion.  Cardiovascular: Normal heart sounds.   Pulmonary/Chest: Breath sounds normal.  Abdominal: There is no tenderness.    Musculoskeletal: She exhibits no edema.       Lumbar back: She exhibits decreased range of motion, tenderness, bony tenderness, pain and spasm. She exhibits no swelling.       Back:  Back:  Range of motion decreased.  Can heel/toe walk and squat without difficulty. Tenderness in the midline and bilateral paraspinous muscles from L2 to Sacral area.  Straight leg raising test is positive on the right at 60 degrees.  Sitting knee extension test is negative.  Strength and sensation in the lower extremities is normal.  Patellar and achilles reflexes are normal   Neurological: She is alert and oriented to person, place, and time.  Skin: Skin is warm and dry.  Nursing note and vitals reviewed.   ED Course  Procedures  None  Imaging Review Dg Lumbar Spine Complete  09/22/2015  CLINICAL DATA:  Pt was putting her baby in her car seat this afternoon and injured her lower back. Pt having lower back pain with RT leg tingling and numbness. No hx surg. EXAM: LUMBAR SPINE - COMPLETE 4+ VIEW COMPARISON:  None. FINDINGS: There is no evidence of lumbar spine fracture. Alignment is normal. Intervertebral disc spaces are maintained. IMPRESSION: Negative. Electronically Signed   By: Norva Pavlov M.D.   On: 09/22/2015 19:11       MDM   1. Low back sprain, initial encounter    Rx for Lortab for pain at night:  1/2 to one HS prn Apply ice pack for 20 to 30 minutes, 3 to 4 times daily  Continue until pain decreases.  May take Ibuprofen , 4 tabs every 8 hours with food.  May use a back brace for about one week.  Begin back exercises as tolerated Followup with Dr. Rodney Langton or Dr. Clementeen [facility] ([facility]) if not improving about two weeks.     Lattie Haw, MD 09/22/15 [phone]

## 2015-09-22 NOTE — ED Notes (Signed)
Low back pain today while putting baby in car seat

## 2015-10-17 ENCOUNTER — Encounter (HOSPITAL_COMMUNITY)
Admission: RE | Admit: 2015-10-17 | Discharge: 2015-10-17 | Disposition: A | Discharge status: Home / Self Care | Payer: Medicare (Managed Care) | Source: Ambulatory Visit | Attending: Obstetrics & Gynecology | Admitting: Obstetrics & Gynecology

## 2015-11-16 ENCOUNTER — Encounter (HOSPITAL_COMMUNITY)
Admission: RE | Admit: 2015-11-16 | Discharge: 2015-11-16 | Disposition: A | Discharge status: Home / Self Care | Payer: Medicare (Managed Care) | Source: Ambulatory Visit | Attending: Obstetrics & Gynecology | Admitting: Obstetrics & Gynecology

## 2015-12-16 ENCOUNTER — Ambulatory Visit (HOSPITAL_COMMUNITY)
Admission: RE | Admit: 2015-12-16 | Discharge: 2015-12-16 | Disposition: A | Discharge status: Home / Self Care | Payer: Medicare (Managed Care) | Source: Ambulatory Visit | Attending: Obstetrics & Gynecology | Admitting: Obstetrics & Gynecology

## 2016-01-18 ENCOUNTER — Encounter (HOSPITAL_COMMUNITY)
Admission: RE | Admit: 2016-01-18 | Discharge: 2016-01-18 | Disposition: A | Discharge status: Home / Self Care | Payer: Medicare (Managed Care) | Source: Ambulatory Visit | Attending: Obstetrics & Gynecology | Admitting: Obstetrics & Gynecology

## 2016-02-19 ENCOUNTER — Encounter (HOSPITAL_COMMUNITY)
Admission: RE | Admit: 2016-02-19 | Discharge: 2016-02-19 | Disposition: A | Discharge status: Home / Self Care | Payer: Medicare (Managed Care) | Source: Ambulatory Visit | Attending: Obstetrics & Gynecology | Admitting: Obstetrics & Gynecology

## 2016-03-20 ENCOUNTER — Encounter (HOSPITAL_COMMUNITY)
Admission: RE | Admit: 2016-03-20 | Discharge: 2016-03-20 | Disposition: A | Discharge status: Home / Self Care | Payer: Medicare (Managed Care) | Source: Ambulatory Visit | Attending: Obstetrics & Gynecology | Admitting: Obstetrics & Gynecology

## 2016-04-06 ENCOUNTER — Encounter: Payer: Self-pay | Admitting: Neurology

## 2016-04-06 ENCOUNTER — Ambulatory Visit (INDEPENDENT_AMBULATORY_CARE_PROVIDER_SITE_OTHER): Payer: Medicare (Managed Care) | Admitting: Neurology

## 2016-04-06 VITALS — BP 119/78 | HR 85 | Resp 16 | Ht 66.0 in | Wt 157.0 lb

## 2016-04-06 DIAGNOSIS — G43711 Chronic migraine without aura, intractable, with status migrainosus: Secondary | ICD-10-CM | POA: Diagnosis not present

## 2016-04-06 MED ORDER — TOPIRAMATE 100 MG PO TABS: 100.0000 mg | ORAL_TABLET | Freq: Every day | ORAL | Status: DC

## 2016-04-06 MED ORDER — BACLOFEN 10 MG PO TABS: 10.0000 mg | ORAL_TABLET | Freq: Three times a day (TID) | ORAL | Status: DC | PRN

## 2016-04-06 NOTE — Patient Instructions (Addendum)
Remember to drink plenty of fluid, eat healthy meals and do not skip any meals. Try to eat protein with a every meal and eat a healthy snack such as fruit or nuts in between meals. Try to keep a regular sleep-wake schedule and try to exercise daily, particularly in the form of walking, 20-30 minutes a day, if you can.   As far as your medications are concerned, I would like to suggest:  Topamax 100mg  a night Baclofen up to 3x a day Zofran and Cambia for nausea and migraine prevention Possibly Botox  Our phone number is [phone]. We also have an after hours call service for urgent matters and there is a physician on-call for urgent questions. For any emergencies you know to call 911 or go to the nearest emergency room

## 2016-04-06 NOTE — Progress Notes (Signed)
[facility]    Provider:  Dr Lucia GaskinsAhern Referring Provider: Lewis Moccasinewey, Elizabeth R, MD Primary Care Physician:  Maryelizabeth RowanEWEY,ELIZABETH, MD  CC:  Migraines  HPI:  Holly [name] is a 40 y.o. female here as a referral from Dr. Duanne Guessewey. She has had migraines for 21 years. She has tried a lot of things. Migraines are affecting her speech and she is slurring stuttering with the migraines. She trained in nursing and psychology. She has daily headaches, migraines. They are on the left side 98% of the time, behind the eye, throbbing and pounding like a "pick axe", laying down in a dark room helps, endorses extreme sounds sensitivity, light sensitivity, smell triggers, no aura. 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 No medication overuse. She [facility] with MRIs and LPs. She has nausea and vomiting with the migraines. No inciting event, no head trauma, has a FHx of migraines.  Lack of sleep worsens, drinks caffeine once a day. She drink enough fluid. She was bedridden for a whole year with migraines. She has daily migraines that can last all day.No other focal neurologic deficits. She does have musculoskeletal neck pain.  She has tried everything: Only Fioricet and Vicodin helps.That is the only thing she takes. She tried Amitrip, nortrip. Currently on topamax 75mg  daily. Also tried venlafaxine, imitrex, maxalt, treximet.   Never tried propranolol  Reviewed notes, labs and imaging from outside physicians, which showed:  CT if the head 2004: CLINICAL DATA: HEADACHE. RIGHT AND LEFT-SIDED NUMBNESS OF ARMS AND LEGS. BLURRED VISION FOR TWO WEEKS. TWO MONTHS POST PARTUM. PREVIOUS MRI OF THE BRAIN IN 1998.CT SCAN OF THE HEAD WITHOUT CONTRAST WITH BONE WINDOWS A SERIES OF SCANS OF THE ENTIRE HEAD ARE MADE WITHOUT CONTRAST AND SHOW NO EVIDENCE OF INTRACRANIAL MASS OR HEMORRHAGE. THERE IS NO SHIFT OF MIDLINE STRUCTURES. THE VENTRICULAR SYSTEM APPEARS  NORMAL. BONE WINDOWS SHOW THE PARANASAL SINUSES, BASE OF THE SKULL, BONY CALVARIUM, AND INTERNAL AUDITORY CANALS TO BE NORMAL. IMPRESSION NORMAL CT SCAN OF THE HEAD WITHOUT CONTRAST.  RPR, HIV negative.   Reviewed notes from OBGyn: Pt has had migraines for 20 years. Patient reports continuous migraine for 3 months. She is on Topamax 75mg  qhs. She has tunnel vision with migraines. Cries unconsolably with blood in her tears. She has diarrhea with the Topamax but wants to continue it. She is nursing her baby and is almost done, decreased sleep and fatigue is worsening migraines. 5 trips to the ER in the last 6 weeks. Imitrex gave her a panic attack and numbness in the face. Exam was unremarkable. On Zofran and Topiramate.  Review of Systems: Patient complains of symptoms per HPI as well as the following symptoms:joint pain, loss of vision, numbness, weakness, No CP, No SOB. Pertinent negatives per HPI. All others negative.   Social History   Social History  . Marital Status: Married    Spouse Name: N/A  . Number of Children: 6  . Years of Education: N/A   Occupational History  . Home Maker    Social History Main Topics  . Smoking status: Never Smoker   . Smokeless tobacco: Not on file  . Alcohol Use: No  . Drug Use: No  . Sexual Activity: Yes    Birth Control/ Protection: None   Other Topics Concern  . Not on file   Social History Narrative   1 caffeine drink a day     Family History  Problem Relation Age  of Onset  . Thyroid disease Mother   . Scoliosis Mother   . Depression Mother   . Migraines Mother   . Crohn's disease Father   . Autoimmune disease Father   . Factor V Leiden deficiency Father   . Heart failure Father   . Diabetes Father     Past Medical History  Diagnosis Date  . Migraine   . IBS (irritable bowel syndrome)   . Depression   . MRSA (methicillin resistant staph aureus) culture positive   . GERD (gastroesophageal reflux disease)     Past  Surgical History  Procedure Laterality Date  . Wrist surgery      cyst removal    Current Outpatient Prescriptions  Medication Sig Dispense Refill  . ibuprofen (ADVIL,MOTRIN) 600 MG tablet Take 1 tablet (600 mg total) by mouth every 6 (six) hours. 30 tablet 0  . Prenatal Vit-Fe Fumarate-FA (PRENATAL MULTIVITAMIN) TABS tablet Take 1 tablet by mouth at bedtime.    . ranitidine (ZANTAC) 150 MG tablet Take 150 mg by mouth 2 (two) times daily.    . sertraline (ZOLOFT) 50 MG tablet Take 1 tablet (50 mg) by mouth daily  3  . baclofen (LIORESAL) 10 MG tablet Take 1 tablet (10 mg total) by mouth 3 (three) times daily as needed for muscle spasms. 90 each 12  . topiramate (TOPAMAX) 100 MG tablet Take 1 tablet (100 mg total) by mouth at bedtime. 30 tablet 12   No current facility-administered medications for this visit.    Allergies as of 04/06/2016 - Review Complete 04/06/2016  Allergen Reaction Noted  . Terbutaline Palpitations 05/26/2015  . Sumatriptan  04/06/2016  . Adhesive [tape] Itching and Rash 12/30/2012  . Ciprofloxacin Rash 05/26/2015  . Zyrtec allergy [cetirizine hcl] Other (See Comments) 05/26/2015    Vitals: BP 119/78 mmHg  Pulse 85  Resp 16  Ht 5\' 6"  (1.676 m)  Wt 157 lb (71.215 kg)  BMI 25.35 kg/m2 Last Weight:  Wt Readings from Last 1 Encounters:  04/06/16 157 lb (71.215 kg)   Last Height:   Ht Readings from Last 1 Encounters:  04/06/16 5\' 6"  (1.676 m)    Physical exam: Exam: Gen: NAD, conversant, well nourised, well groomed                     CV: RRR, no MRG. No Carotid Bruits. No peripheral edema, warm, nontender Eyes: Conjunctivae clear without exudates or hemorrhage  Neuro: Detailed Neurologic Exam  Speech:    Speech is normal; fluent and spontaneous with normal comprehension.  Cognition:    The patient is oriented to person, place, and time;     recent and remote memory intact;     language fluent;     normal attention, concentration,     fund of  knowledge Cranial Nerves:    The pupils are equal, round, and reactive to light. The fundi are normal and spontaneous venous pulsations are present. Visual fields are full to finger confrontation. Extraocular movements are intact. Trigeminal sensation is intact and the muscles of mastication are normal. The face is symmetric. The palate elevates in the midline. Hearing intact. Voice is normal. Shoulder shrug is normal. The tongue has normal motion without fasciculations.   Coordination:    Normal finger to nose and heel to shin. Normal rapid alternating movements.   Gait:    Heel-toe and tandem gait are normal.   Motor Observation:    No asymmetry, no atrophy, and no involuntary  movements noted. Tone:    Normal muscle tone.    Posture:    Posture is normal. normal erect    Strength:    Strength is V/V in the upper and lower limbs.      Sensation: intact to LT     Reflex Exam:  DTR's:    Deep tendon reflexes in the upper and lower extremities are normal bilaterally.   Toes:    The toes are downgoing bilaterally.   Clonus: 8   Clonus is absent.      Assessment/Plan:  40 year old with intractable chronic migraines.  [facility] - specialty pharmacy, will order. Topamax: increase to  qhs zofran for nausea Baclofen prn for migraines She is breastfeeding, discussed risks,No medication can be considered entirely safe,  reviewed literature with patient on drugs above in breastfeeding . Patient understands the risks.  CMP today Discussed botox for migraine  To prevent or relieve headaches, try the following: Cool Compress. Lie down and place a cool compress on your head.  Avoid headache triggers. If certain foods or odors seem to have triggered your migraines in the past, avoid them. A headache diary might help you identify triggers.  Include physical activity in your daily routine. Try a daily walk or other moderate aerobic exercise.  Manage stress. Find healthy ways to cope  with the stressors, such as delegating tasks on your to-do list.  Practice relaxation techniques. Try deep breathing, yoga, massage and visualization.  Eat regularly. Eating regularly scheduled meals and maintaining a healthy diet might help prevent headaches. Also, drink plenty of fluids.  Follow a regular sleep schedule. Sleep deprivation might contribute to headaches Consider biofeedback. With this mind-body technique, you learn to control certain bodily functions - such as muscle tension, heart rate and blood pressure - to prevent headaches or reduce headache pain.    Proceed to emergency room if you experience new or worsening symptoms or symptoms do not resolve, if you have new neurologic symptoms or if headache is severe, or for any concerning symptom.    CC: Karleen Dolphin, MD  [facility] [address] [place], [place] [address]  Phone [phone] Fax [phone]

## 2016-04-08 ENCOUNTER — Encounter: Payer: Self-pay | Admitting: Neurology

## 2016-04-08 DIAGNOSIS — G43711 Chronic migraine without aura, intractable, with status migrainosus: Secondary | ICD-10-CM | POA: Insufficient documentation

## 2016-04-09 ENCOUNTER — Encounter: Payer: Self-pay | Admitting: *Deleted

## 2016-04-09 NOTE — Progress Notes (Signed)
Faxed enrollment form for Cambia to [facility]. Fax: [phone]. Received confirmation.

## 2016-04-11 ENCOUNTER — Other Ambulatory Visit: Payer: Self-pay | Admitting: Family Medicine

## 2016-04-11 DIAGNOSIS — N6310 Unspecified lump in the right breast, unspecified quadrant: Secondary | ICD-10-CM

## 2016-04-11 DIAGNOSIS — N644 Mastodynia: Secondary | ICD-10-CM

## 2016-04-18 ENCOUNTER — Ambulatory Visit
Admission: RE | Admit: 2016-04-18 | Discharge: 2016-04-18 | Disposition: A | Discharge status: Home / Self Care | Payer: Medicare (Managed Care) | Source: Ambulatory Visit | Attending: Family Medicine | Admitting: Family Medicine

## 2016-04-18 ENCOUNTER — Other Ambulatory Visit: Payer: Self-pay | Admitting: Family Medicine

## 2016-04-18 DIAGNOSIS — N644 Mastodynia: Secondary | ICD-10-CM

## 2016-04-18 DIAGNOSIS — N6320 Unspecified lump in the left breast, unspecified quadrant: Secondary | ICD-10-CM

## 2016-04-18 DIAGNOSIS — N6310 Unspecified lump in the right breast, unspecified quadrant: Secondary | ICD-10-CM

## 2016-04-20 ENCOUNTER — Other Ambulatory Visit: Payer: Self-pay | Admitting: Neurology

## 2016-04-20 ENCOUNTER — Telehealth: Payer: Self-pay | Admitting: Neurology

## 2016-04-20 ENCOUNTER — Encounter (HOSPITAL_COMMUNITY)
Admission: RE | Admit: 2016-04-20 | Discharge: 2016-04-20 | Disposition: A | Discharge status: Home / Self Care | Payer: Medicare (Managed Care) | Source: Ambulatory Visit | Attending: Obstetrics & Gynecology | Admitting: Obstetrics & Gynecology

## 2016-04-20 MED ORDER — BUTALBITAL-APAP-CAFFEINE 50-325-40 MG PO TABS: 1.0000 | ORAL_TABLET | Freq: Four times a day (QID) | ORAL | Status: DC | PRN

## 2016-04-20 NOTE — Telephone Encounter (Signed)
Message For: ON CALL          (54)     Taken  2-JUN-17 at  9:02AM by [facility] Delivered  2-JUN-17 at  9:36AM by BEF to                ------------------------------------------------------------ Holly [name]            CID [id number]  Patient SAME                 Pt's Dr Frances FurbishATHAR        Area Code 336 Phone# [phone] * DOB [phone]     RE DR. Javier DockerWANTED UPDATE, PATIENT GOT MIGRAINE, THE      RX HAS NOT HELPED, IN ALOT OF PAIN                   Disp:Y/N Y If Y = C/B If No Response In 20minutes ============================================================

## 2016-04-20 NOTE — Telephone Encounter (Signed)
Pt called said she spoke with someone at 9:03 and was told if doctor had not called her back in 20 minutes to call back. Operator asked all call center ladies, no one had spoke to her, Holly [name] checked answering service messages, no message there. Probably answering service and we have not rec'd her message yet. Pt called sts she had migraine that started very suddenly yesterday 04/19/16. She took Cambia about 3pm yesterday, it did not help, she went to bed about 5 pm. She said it did help with the heaviness in her head. Today she is a pain level 7. She is concerned any noise will start it in intensity again. She is unsure if she should take Cambia again today, she said it did upset her stomach.

## 2016-04-20 NOTE — Telephone Encounter (Signed)
Spoke with patient, she declines triptan, she would like firicet and I discussed I will give it to her but limited to avoid rebound and overuse, she achnowledged.

## 2016-05-17 ENCOUNTER — Telehealth: Payer: Self-pay | Admitting: Neurology

## 2016-05-17 NOTE — Telephone Encounter (Signed)
Patient is calling. She has an appointment with Dr. Lucia GaskinsAhern on 9-19 but thinks she needs to be seen sooner. Her migraine has changed. She now has numbness and weakness on the right side of her body, memory changes, speech difficulties x 6 months. Please call to discuss.

## 2016-05-17 NOTE — Telephone Encounter (Signed)
Dr Ahern/MeganLorain Childes- FYI  Called pt back. She said she thinks migraines are causing the other sx such as numb.weakness on right side of body, memory changes.  I made f/u per pt request with MM, NP on 05/29/16. Ok with Dr Lucia GaskinsAhern to see NP Jearld ShinesELK 05/17/16. I offered several earlier appt times with CM, NP but pt declined. Advised her to check in no later than 845am. She verbalized understanding.

## 2016-05-20 ENCOUNTER — Encounter (HOSPITAL_COMMUNITY)
Admission: RE | Admit: 2016-05-20 | Discharge: 2016-05-20 | Disposition: A | Discharge status: Home / Self Care | Payer: Medicare (Managed Care) | Source: Ambulatory Visit | Attending: Obstetrics & Gynecology | Admitting: Obstetrics & Gynecology

## 2016-05-29 ENCOUNTER — Encounter: Payer: Self-pay | Admitting: Adult Health

## 2016-05-29 ENCOUNTER — Ambulatory Visit (INDEPENDENT_AMBULATORY_CARE_PROVIDER_SITE_OTHER): Payer: Medicare (Managed Care) | Admitting: Adult Health

## 2016-05-29 VITALS — BP 126/70 | HR 88 | Resp 20 | Ht 66.0 in | Wt 150.0 lb

## 2016-05-29 DIAGNOSIS — G43409 Hemiplegic migraine, not intractable, without status migrainosus: Secondary | ICD-10-CM | POA: Diagnosis not present

## 2016-05-29 NOTE — Progress Notes (Addendum)
PATIENT: Holly [name] DOB: [birth year]  REASON FOR VISIT: follow up- migraines HISTORY FROM: patient  HISTORY OF PRESENT ILLNESS: Today 04/29/2016: [name] is a 40 year old female with a history of migraine headaches. She returns today for follow-up. She states that her headaches have improved with Topamax. She states she typically has 1-2 headaches a week. Before she was having 3-5 headaches a week. She is currently taken Topamax 150 mg at bedtime. She reports that with her migraines she is experiencing weakness on the right side typically in the arm but it can move to the leg. She also noticed slurring speech and stuttering. In the past the symptoms have been worrisome and she went to the emergency room thinking she was having a stroke. She was told that it was migraine headache. She reports that the symptoms never occur without a migraine. She uses caffeine, Tylenol, Advil and Fioricet in that order to treat her acute headaches. In the past she has tried triptans and Cambia with minimal benefit. She states baclofen was helpful for her headaches however it stopped her milk production. She plans to continue breast-feeding to the child is 35 months old. She returns today for an evaluation.  HISTORY 04/03/16 (AHERN):Holly [name] is a 40 y.o. female here as a referral from Dr. Duanne Guess. She has had migraines for 21 years. She has tried a lot of things. Migraines are affecting her speech and she is slurring stuttering with the migraines. She trained in nursing and psychology. She has daily headaches, migraines. They are on the left side 98% of the time, behind the eye, throbbing and pounding like a "pick axe", laying down in a dark room helps, endorses extreme sounds sensitivity, light sensitivity, smell triggers, no aura. 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 No medication overuse. She had extensive testing at the [facility] with MRIs and LPs. She has nausea and vomiting with the  migraines. No inciting event, no head trauma, has a FHx of migraines. Lack of sleep worsens, drinks caffeine once a day. She drink enough fluid. She was bedridden for a whole year with migraines. She has daily migraines that can last all day.No other focal neurologic deficits. She does have musculoskeletal neck pain.  She has tried everything: Only Fioricet and Vicodin helps.That is the only thing she takes. She tried Amitrip, nortrip. Currently on topamax  daily. Also tried venlafaxine, imitrex, maxalt, treximet.   Never tried propranolol  Reviewed notes, labs and imag                                                                                                                                                                                                                                                                                                                  =  ing from outside physicians, which showed:  CT if the head 2004: CLINICAL DATA: HEADACHE. RIGHT AND LEFT-SIDED NUMBNESS OF ARMS AND LEGS. BLURRED VISION FOR TWO WEEKS. TWO MONTHS POST PARTUM. PREVIOUS MRI OF THE BRAIN IN 1998.CT SCAN OF THE HEAD WITHOUT CONTRAST WITH BONE WINDOWS A SERIES OF SCANS OF THE ENTIRE HEAD ARE MADE WITHOUT CONTRAST AND SHOW NO EVIDENCE OF INTRACRANIAL MASS OR HEMORRHAGE. THERE IS NO SHIFT OF MIDLINE STRUCTURES. THE VENTRICULAR SYSTEM APPEARS NORMAL. BONE WINDOWS SHOW THE PARANASAL SINUSES, BASE OF THE SKULL, BONY CALVARIUM, AND INTERNAL AUDITORY CANALS TO BE NORMAL. IMPRESSION NORMAL CT SCAN OF THE HEAD WITHOUT CONTRAST.  RPR, HIV negative.   Reviewed notes from OBGyn: Pt has had migraines for 20 years. Patient reports continuous migraine for 3 months. She is on Topamax 75mg  qhs. She has tunnel vision with migraines. Cries unconsolably with blood in her tears. She has diarrhea with the Topamax but wants to continue it. She is nursing her baby and is almost done, decreased sleep and fatigue is  worsening migraines. 5 trips to the ER in the last 6 weeks. Imitrex gave her a panic attack and numbness in the face. Exam was unremarkable. On Zofran and Topiramate.  REVIEW OF SYSTEMS: Out of a complete 14 system review of symptoms, the patient complains only of the following symptoms, and all other reviewed systems are negative.  Light sensitivity, blurred vision, ringing in ears, memory loss, dizziness, headache, numbness, weakness, confusion  ALLERGIES: Allergies  Allergen Reactions  . Terbutaline Palpitations  . Sumatriptan   . Adhesive [Tape] Itching and Rash  . Ciprofloxacin Rash  . Zyrtec Allergy [Cetirizine Hcl] Other (See Comments)    Overly sleepy    HOME MEDICATIONS: Outpatient Prescriptions Prior to Visit  Medication Sig Dispense Refill  . butalbital-acetaminophen-caffeine (FIORICET, ESGIC) 50-325-40 MG tablet Take 1 tablet by mouth every 6 (six) hours as needed for headache. 10 tablet 5  . ibuprofen (ADVIL,MOTRIN) 600 MG tablet Take 1 tablet (600 mg total) by mouth every 6 (six) hours. 30 tablet 0  . Prenatal Vit-Fe Fumarate-FA (PRENATAL MULTIVITAMIN) TABS tablet Take 1 tablet by mouth at bedtime.    . ranitidine (ZANTAC) 150 MG tablet Take 150 mg by mouth 2 (two) times daily.    . sertraline (ZOLOFT) 50 MG tablet Take 1 tablet (50 mg) by mouth daily  3  . topiramate (TOPAMAX) 100 MG tablet Take 1 tablet (100 mg total) by mouth at bedtime. 30 tablet 12  . baclofen (LIORESAL) 10 MG tablet Take 1 tablet (10 mg total) by mouth 3 (three) times daily as needed for muscle spasms. 90 each 12   No facility-administered medications prior to visit.    PAST MEDICAL HISTORY: Past Medical History  Diagnosis Date  . Migraine   . IBS (irritable bowel syndrome)   . Depression   . MRSA (methicillin resistant staph aureus) culture positive   . GERD (gastroesophageal reflux disease)     PAST SURGICAL HISTORY: Past Surgical History  Procedure Laterality Date  . Wrist surgery        cyst removal    FAMILY HISTORY: Family History  Problem Relation Age of Onset  . Thyroid disease Mother   . Scoliosis Mother   . Depression Mother   . Migraines Mother   . Crohn's disease Father   . Autoimmune disease Father   . Factor V Leiden deficiency Father   . Heart failure Father   . Diabetes Father     SOCIAL HISTORY: Social  History   Social History  . Marital Status: Married    Spouse Name: N/A  . Number of Children: 6  . Years of Education: N/A   Occupational History  . Home Maker    Social History Main Topics  . Smoking status: Never Smoker   . Smokeless tobacco: Not on file  . Alcohol Use: No  . Drug Use: No  . Sexual Activity: Yes    Birth Control/ Protection: None   Other Topics Concern  . Not on file   Social History Narrative   1 caffeine drink a day       PHYSICAL EXAM  Filed Vitals:   05/29/16 0853  BP: 126/70  Pulse: 88  Resp: 20  Height: 5\' 6"  (1.676 m)  Weight: 150 lb (68.04 kg)   Body mass index is 24.22 kg/(m^2).  Generalized: Well developed, in no acute distress   Neurological examination  Mentation: Alert oriented to time, place, history taking. Follows all commands speech and language fluent Cranial nerve II-XII: Pupils were equal round reactive to light. Extraocular movements were full, visual field were full on confrontational test. Facial sensation and strength were normal. Uvula tongue midline. Head turning and shoulder shrug  were normal and symmetric. Motor: The motor testing reveals 5 over 5 strength of all 4 extremities. Good symmetric motor tone is noted throughout.  Sensory: Sensory testing is intact to soft touch on all 4 extremities. No evidence of extinction is noted.  Coordination: Cerebellar testing reveals good finger-nose-finger and heel-to-shin bilaterally.  Gait and station: Gait is normal. Tandem gait is normal. Romberg is negative. No drift is seen.  Reflexes: Deep tendon reflexes are symmetric and  normal bilaterally.   DIAGNOSTIC DATA (LABS, IMAGING, TESTING) - I reviewed patient records, labs, notes, testing and imaging myself where available.     ASSESSMENT AND PLAN 40 y.o. year old female  has a past medical history of Migraine; IBS (irritable bowel syndrome); Depression; MRSA (methicillin resistant staph aureus) culture positive; and GERD (gastroesophageal reflux disease). here with:  1. Migraine headaches  The patient will continue taking Topamax. She should continue to increase her dose to a maximum of 200 mg at bedtime. Advised that she should take Fioricet at the onset of her migraine. This medication should not be taken daily as it can cause rebound headaches. Patient advised that her slurring speech and weakness on the right side can accompany a migraine and present as if she is having a stroke. Patient verbalized understanding. However if the symptoms cannot be related to a migraine she should go to the emergency room. She will keep her appointment in September with Dr. Lucia GaskinsAhern. She will call if her symptoms worsen or she develops any new symptoms.    Butch PennyMegan Freddie Dymek, MSN, NP-C 05/29/2016, 9:08 AM [facility] [address], [address] [place], [place] [address] [phone]    Personally participated in and made any corrections needed to history, physical, neuro exam,assessment and plan as stated above.  I have personally obtained the history, evaluated lab date, reviewed imaging studies and agree with radiology interpretations.    Naomie DeanAntonia Ahern, MD [facility]

## 2016-05-29 NOTE — Patient Instructions (Signed)
Continue to increase Topamax to 200 mg at bedtime Continue Fioricet If your symptoms worsen or you develop new symptoms please let us know.

## 2016-06-04 ENCOUNTER — Telehealth: Payer: Self-pay | Admitting: Adult Health

## 2016-06-04 NOTE — Telephone Encounter (Signed)
Patient called to check status of message left earlier today, please call [phone].

## 2016-06-04 NOTE — Telephone Encounter (Signed)
Pt called sts she just increased the topamax 150mg  Monday 7/10. She had a HA that started overnight on Monday until Wednesday evening. The 2nd HA started overnight on Friday 7/14 and into Saturday. She took fioricet but it did not help. She is wondering if the increase in the topamax could cause this.  Pt is not wanting to go to ED but will if she is instructed to do so.

## 2016-06-04 NOTE — Telephone Encounter (Signed)
Returned pt TC. Let her know that headache is not a usual side effect of Topamax. Explained that the medication is actually given as a headache preventative but she's not yet on highest recommended dose. She has titrated up to 150 mg at bedtime which she's been on for 1 week. Encouraged her to increase dose to 175 mg and to call back if HA does not improve in the next day or two. She's aware not to exceed 200 mg daily and to use Fioricet sparingly for breakthrough pain.

## 2016-06-19 ENCOUNTER — Other Ambulatory Visit: Payer: Self-pay | Admitting: Neurology

## 2016-06-19 ENCOUNTER — Encounter (HOSPITAL_COMMUNITY)
Admission: RE | Admit: 2016-06-19 | Discharge: 2016-06-19 | Disposition: A | Discharge status: Home / Self Care | Payer: Medicare (Managed Care) | Source: Ambulatory Visit | Attending: Obstetrics & Gynecology | Admitting: Obstetrics & Gynecology

## 2016-06-19 ENCOUNTER — Encounter: Payer: Self-pay | Admitting: Neurology

## 2016-06-19 MED ORDER — TOPIRAMATE 200 MG PO TABS: 200.0000 mg | ORAL_TABLET | Freq: Every day | ORAL | 12 refills | Status: DC

## 2016-07-04 ENCOUNTER — Encounter: Payer: Self-pay | Admitting: Neurology

## 2016-07-20 ENCOUNTER — Encounter: Payer: Self-pay | Admitting: Neurology

## 2016-07-20 ENCOUNTER — Encounter (HOSPITAL_COMMUNITY)
Admission: RE | Admit: 2016-07-20 | Discharge: 2016-07-20 | Disposition: A | Discharge status: Home / Self Care | Payer: Medicare (Managed Care) | Source: Ambulatory Visit | Attending: Obstetrics & Gynecology | Admitting: Obstetrics & Gynecology

## 2016-07-20 ENCOUNTER — Telehealth: Payer: Self-pay | Admitting: Neurology

## 2016-07-20 NOTE — Telephone Encounter (Signed)
Pt called in stating she called earlier this morning and also sent email asking if robaxan will cause re-bound headaches. She woke up around 4 am shaking, vomiting , crying due to headache. 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 Please call [phone]

## 2016-07-20 NOTE — Telephone Encounter (Signed)
Sandy I am out on leave can you follow up on this? thanks

## 2016-07-20 NOTE — Telephone Encounter (Signed)
I called patient and she felt she was having rebound headaches, she stated, she has a pinched nerve in the neck and also headaches, took fioricet at 4 AM and nausea medication. She was advised to take another motrin, could take advil or Motrin. I am not aware of robaxin causing HAs, but told her that any medication taken daily and stopped abruptly could potentially increase HAs. I advised her that I would route her message to Dr. Lucia GaskinsAhern as well for further input if needed from her.

## 2016-07-20 NOTE — Telephone Encounter (Signed)
I spoke to pt.  She is doing better.  She wonders if the robaxin caused the headache/dizziness.  I told her that someone on call at the office but if migraine so bad will need to go to urgent care/ ED.  She stated she could not drive.  I told her that if she chose to try robaxin again then to do so when her husband was around incase she had same reaction.  She verbalized understanding.

## 2016-08-07 ENCOUNTER — Ambulatory Visit (INDEPENDENT_AMBULATORY_CARE_PROVIDER_SITE_OTHER): Payer: Medicare (Managed Care) | Admitting: Neurology

## 2016-08-07 ENCOUNTER — Encounter: Payer: Self-pay | Admitting: Neurology

## 2016-08-07 VITALS — BP 106/71 | HR 94 | Ht 66.0 in | Wt 142.8 lb

## 2016-08-07 DIAGNOSIS — G43111 Migraine with aura, intractable, with status migrainosus: Secondary | ICD-10-CM

## 2016-08-07 DIAGNOSIS — G43411 Hemiplegic migraine, intractable, with status migrainosus: Secondary | ICD-10-CM | POA: Diagnosis not present

## 2016-08-07 MED ORDER — BUTALBITAL-APAP-CAFFEINE 50-325-40 MG PO TABS: 1.0000 | ORAL_TABLET | Freq: Four times a day (QID) | ORAL | 5 refills | Status: DC | PRN

## 2016-08-07 NOTE — Patient Instructions (Addendum)
Remember to drink plenty of fluid, eat healthy meals and do not skip any meals. Try to eat protein with a every meal and eat a healthy snack such as fruit or nuts in between meals. Try to keep a regular sleep-wake schedule and try to exercise daily, particularly in the form of walking, 20-30 minutes a day, if you can.   I would like to see you back in 6 months, sooner if we need to. Please call us with any interim questions, concerns, problems, updates or refill requests.   Our phone number is [phone]. We also have an after hours call service for urgent matters and there is a physician on-call for urgent questions. For any emergencies you know to call 911 or go to the nearest emergency room  Effective for menses related migraines reduce the fall in estrogen to 10ucg or less Specific formulations include: Lybrel (365d) which is now generic as Careers information officerAmethyst (generic w more breakthough bleeding) Lo-Seasonique 84d(regular seasonique has a drop of 20mcg from 30 to 10mcg which is too much) Lo loestrin 1/10 FE NuvaRing 15mcg, keep in 4wk, then replace. If wants to cycle, can use a 0.075mg  estrogen patch during the week it is out   Keep in mind that the pill is still daily spikes of estrogen at the time you take the pill NuvaRing provides the most steady, consistent estrogen levels

## 2016-08-07 NOTE — Progress Notes (Signed)
[facility]    Provider:  Dr Lucia GaskinsAhern Referring Provider: Lewis Moccasinewey, Holly R, MD Primary Care Physician:  Holly RowanEWEY,ELIZABETH, MD  CC:  Debilitating migraines  Interval History 08/07/2016:  Holly [name] is a [age] y.o. female here as a referral from Dr. Duanne Guessewey for hemiplegic migraines. She has severe migraines what impair her both physicilly and cognitively, she can't even remember her own name, she can;t walk straight, her balance is impaired, she has slurred speech, she stutters, can;t take care of her 6 children or at times herself when recurrence of migraines. 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 The last several have been extreme in her severity. She has gone to the emergency room multiple times for severe refractory migraines. Patient's migraines have improved with Topiramate but they are still occurring 3-4 days a week and can be severe when she has a migraine 10 days in a row. She takes Topiramate 200mg  daily. She is breastfeeding. When she has migraines she is debilitated often and has to stay in bed in a dark room all day and her husband has had to stay home. She has 15-17 migraine days in a month. She is weaning her last child this last month and her hormones are fluctuating. Will have the Botox representative call.   Meds tried: Topiramate,Amitrip, nortrip. Currently on topamax 200mg  daily. Also tried venlafaxine, imitrex, maxalt, treximet. Blood pressure medications are contraindicated due to her very low blood pressure.   Holly [name] 05/29/2016: Today 04/29/2016: Holly [name] is a 40 year old female with a history of migraine headaches. She returns today for follow-up. She states that her headaches have improved with Topamax. She states she typically has 1-2 headaches a week. Before she was having 3-5 headaches a week. She is currently taken Topamax 150 mg at  bedtime. She reports that with her migraines she is experiencing weakness on the right side typically in the arm but it can move to the leg. She also noticed slurring speech and stuttering. In the past the symptoms have been worrisome and she went to the emergency room thinking she was having a stroke. She was told that it was migraine headache. She reports that the symptoms never occur without a migraine. She uses caffeine, Tylenol, Advil and Fioricet in that order to treat her acute headaches. In the past she has tried triptans and Cambia with minimal benefit. She states baclofen was helpful for her headaches however it stopped her milk production. She plans to continue breast-feeding to the child is 3612 months old. She returns today for an evaluation.    HPI 04/06/2016:  Holly [name] is a 40 y.o. female here as a referral from Dr. Duanne Guessewey. She has had migraines for 21 years. She has tried a lot of things. Migraines are affecting her speech and she is slurring stuttering with the migraines. She trained in nursing and psychology. She has daily headaches, migraines. They are on the left side 98% of the time, behind the eye, throbbing and pounding like a "pick axe", laying down in a dark room helps, endorses extreme sounds sensitivity, light sensitivity, smell triggers, no aura. 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 No medication overuse. She had extensive testing at [facility] with MRIs and LPs. She has nausea and vomiting with the migraines. No inciting event, no head trauma, has a FHx of migraines.  Lack of sleep worsens, drinks  caffeine once a day. She drink enough fluid. She was bedridden for a whole year with migraines. She has daily migraines that can last all day.No other focal neurologic deficits. She does have musculoskeletal neck pain.  She has tried everything: Only Fioricet and Vicodin helps.That is the only thing she takes. She tried Amitrip, nortrip. Currently on topamax  75mg  daily. Also tried venlafaxine, imitrex, maxalt, treximet.   Never tried propranolol  Reviewed notes, labs and imaging from outside physicians, which showed:  CT if the head 2004: CLINICAL DATA: HEADACHE. RIGHT AND LEFT-SIDED NUMBNESS OF ARMS AND LEGS. BLURRED VISION FOR TWO WEEKS. TWO MONTHS POST PARTUM. PREVIOUS MRI OF THE BRAIN IN 1998.CT SCAN OF THE HEAD WITHOUT CONTRAST WITH BONE WINDOWS A SERIES OF SCANS OF THE ENTIRE HEAD ARE MADE WITHOUT CONTRAST AND SHOW NO EVIDENCE OF INTRACRANIAL MASS OR HEMORRHAGE. THERE IS NO SHIFT OF MIDLINE STRUCTURES. THE VENTRICULAR SYSTEM APPEARS NORMAL. BONE WINDOWS SHOW THE PARANASAL SINUSES, BASE OF THE SKULL, BONY CALVARIUM, AND INTERNAL AUDITORY CANALS TO BE NORMAL. IMPRESSION NORMAL CT SCAN OF THE HEAD WITHOUT CONTRAST.  RPR, HIV negative.   Reviewed notes from OBGyn: Pt has had migraines for 20 years. Patient reports continuous migraine for 3 months. She is on Topamax 75mg  qhs. She has tunnel vision with migraines. Cries unconsolably with blood in her tears. She has diarrhea with the Topamax but wants to continue it. She is nursing her baby and is almost done, decreased sleep and fatigue is worsening migraines. 5 trips to the ER in the last 6 weeks. Imitrex gave her a panic attack and numbness in the face. Exam was unremarkable. On Zofran and Topiramate.  Review of Systems: Patient complains of symptoms per HPI as well as the following symptoms:joint pain, loss of vision, numbness, weakness, No CP, No SOB. Pertinent negatives per HPI. All others negative.   Social History   Social History  . Marital status: Married    Spouse name: N/A  . Number of children: 6  . Years of education: N/A   Occupational History  . Home Maker    Social History Main Topics  . Smoking status: Never Smoker  . Smokeless tobacco: Never Used  . Alcohol use No  . Drug use: No  . Sexual activity: Yes    Birth control/ protection: None   Other  Topics Concern  . Not on file   Social History Narrative   1 caffeine drink a day     Family History  Problem Relation Age of Onset  . Thyroid disease Mother   . Scoliosis Mother   . Depression Mother   . Migraines Mother   . Crohn's disease Father   . Autoimmune disease Father   . Factor V Leiden deficiency Father   . Heart failure Father   . Diabetes Father     Past Medical History:  Diagnosis Date  . Depression   . GERD (gastroesophageal reflux disease)   . IBS (irritable bowel syndrome)   . Migraine   . MRSA (methicillin resistant staph aureus) culture positive     Past Surgical History:  Procedure Laterality Date  . WRIST SURGERY     cyst removal    Current Outpatient Prescriptions  Medication Sig Dispense Refill  . butalbital-acetaminophen-caffeine (FIORICET, ESGIC) 50-325-40 MG tablet Take 1 tablet by mouth every 6 (six) hours as needed for headache. 15 tablet 5  . Prenatal Vit-Fe Fumarate-FA (PRENATAL MULTIVITAMIN) TABS tablet Take 1 tablet by mouth at bedtime.    Marland Kitchen  ranitidine (ZANTAC) 150 MG tablet Take 150 mg by mouth 2 (two) times daily.    . sertraline (ZOLOFT) 50 MG tablet Take 1 tablet (50 mg) by mouth daily  3  . topiramate (TOPAMAX) 200 MG tablet Take 1 tablet (200 mg total) by mouth at bedtime. 30 tablet 12   No current facility-administered medications for this visit.     Allergies as of 08/07/2016 - Review Complete 08/07/2016  Allergen Reaction Noted  . Terbutaline Palpitations 05/26/2015  . Sumatriptan  04/06/2016  . Adhesive [tape] Itching and Rash 12/30/2012  . Ciprofloxacin Rash 05/26/2015  . Zyrtec allergy [cetirizine hcl] Other (See Comments) 05/26/2015    Vitals: BP 106/71 (BP Location: Left Arm, Patient Position: Sitting, Cuff Size: Normal)   Pulse 94   Ht 5\' 6"  (1.676 m)   Wt 142 lb 12.8 oz (64.8 kg)   BMI 23.05 kg/m  Last Weight:  Wt Readings from Last 1 Encounters:  08/07/16 142 lb 12.8 oz (64.8 kg)   Last Height:   Ht  Readings from Last 1 Encounters:  08/07/16 5\' 6"  (1.676 m)     Physical exam: Exam: Gen: NAD, conversant, well nourised, well groomed                     CV: RRR, no MRG. No Carotid Bruits. No peripheral edema, warm, nontender Eyes: Conjunctivae clear without exudates or hemorrhage  Neuro: Detailed Neurologic Exam  Speech:    Speech is normal; fluent and spontaneous with normal comprehension.  Cognition:    The patient is oriented to person, place, and time;     recent and remote memory intact;     language fluent;     normal attention, concentration,     fund of knowledge Cranial Nerves:    The pupils are equal, round, and reactive to light. The fundi are normal and spontaneous venous pulsations are present. Visual fields are full to finger confrontation. Extraocular movements are intact. Trigeminal sensation is intact and the muscles of mastication are normal. The face is symmetric. The palate elevates in the midline. Hearing intact. Voice is normal. Shoulder shrug is normal. The tongue has normal motion without fasciculations.   Coordination:    Normal finger to nose and heel to shin. Normal rapid alternating movements.   Gait:    Heel-toe and tandem gait are normal.   Motor Observation:    No asymmetry, no atrophy, and no involuntary movements noted. Tone:    Normal muscle tone.    Posture:    Posture is normal. normal erect    Strength:    Strength is V/V in the upper and lower limbs.      Sensation: intact to LT     Reflex Exam:  DTR's:    Deep tendon reflexes in the upper and lower extremities are normal bilaterally.   Toes:    The toes are downgoing bilaterally.   Clonus:    Clonus is absent.      Assessment/Plan:  40 year old with intractable chronicsevere migraines with hemiplegia and severe cognitive impairment with the migraine episodes.   [facility] - specialty pharmacy, will order. Topamax: continue 200mg  qhs zofran for nausea Baclofen  prn for migraines She is breastfeeding, discussed risks,No medication can be considered entirely safe,  reviewed literature with patient on drugs above in breastfeeding . Patient understands the risks.  CMP today Discussed botox for migraine, at this point she has failed medications, is on high dose Topamax, feel botox  is a necessary next step  To prevent or relieve headaches, try the following:  Cool Compress. Lie down and place a cool compress on your head.   Avoid headache triggers. If certain foods or odors seem to have triggered your migraines in the past, avoid them. A headache diary might help you identify triggers.   Include physical activity in your daily routine. Try a daily walk or other moderate aerobic exercise.   Manage stress. Find healthy ways to cope with the stressors, such as delegating tasks on your to-do list.   Practice relaxation techniques. Try deep breathing, yoga, massage and visualization.   Eat regularly. Eating regularly scheduled meals and maintaining a healthy diet might help prevent headaches. Also, drink plenty of fluids.   Follow a regular sleep schedule. Sleep deprivation might contribute to headaches  Consider biofeedback. With this mind-body technique, you learn to control certain bodily functions - such as muscle tension, heart rate and blood pressure - to prevent headaches or reduce headache pain.    Proceed to emergency room if you experience new or worsening symptoms or symptoms do not resolve, if you have new neurologic symptoms or if headache is severe, or for any concerning symptom.    CC: Karleen Dolphin, MD  [facility] [address] [place], [place] [address]  Phone [phone] Fax [phone]  A total of 30 minutes was spent face-to-face with this patient. Over half this time was spent on counseling patient on the intractable migraine diagnosis and different diagnostic and  therapeutic options available.

## 2016-08-09 ENCOUNTER — Telehealth: Payer: Self-pay | Admitting: Neurology

## 2016-08-09 NOTE — Telephone Encounter (Signed)
Dr Lucia GaskinsAhern- please advise  Called and spoke to patient. She stated she does not have normal migraines. She held off on the botox before  Because she was doing well on the topamax and it was working. After she had her kid, migraines started coming back. She wants to know if she should still do botox because now we figured out her migraines are hemiplegic.  She states she is nervous because before when she received a depo injection, she was bed ridden for a year because she did not respond well to LP and blood patch's did not work. She kept having spinal headache. I educated patient that botox injections are different and explained this procedure. She replied "I am actually really familiar with botox injections because I worked at the [facility] and did my research". She asked me several times if patient's who did not tolerate other injections, if they did well with this injection. I advised this is a better question for Dr Lucia GaskinsAhern. I will have to speak with her and call her back. She replied "But she told me to speak with you". I advised I am unable to answer her question at this time and will call her back after I speak with Dr. Lucia GaskinsAhern. She kept repeating "But she told me to speak with you about it". I repeated again that I will have to speak with Dr Lucia GaskinsAhern and I will call her back. She verbalized understanding.

## 2016-08-09 NOTE — Telephone Encounter (Signed)
Spoke with the patient regarding botox injections. She is concerned to receive the injections because she has had a bad experience with injection medications before. She would like to speak with the nurse regarding her previous injection. She also states that her migraines are hemiplegic migraines and wants to know if this will affect the botox. Please call and advise.

## 2016-08-09 NOTE — Telephone Encounter (Signed)
Called and LVM for pt to call back. Gave [facility] phone number.  

## 2016-08-09 NOTE — Telephone Encounter (Signed)
I will ask botox reseach team for research on this particular subject and get back to her with any research out there. It may take 4-8 weeks and when I get the informtion I will forward the research to her and we can discuss it at her next appointment. Alternatively I would like to refer her to a headache clinic with a dedicated headache specialist please ask if she is willing to be seen at [facility]. thanks

## 2016-08-14 ENCOUNTER — Telehealth: Payer: Self-pay | Admitting: Neurology

## 2016-08-14 NOTE — Telephone Encounter (Signed)
I spoke to pt, she will stop by today, to pick up her last office note.

## 2016-08-14 NOTE — Telephone Encounter (Signed)
She needs my last office note which summarized everything thanks. Holly KidneyDebra can you help? thanks

## 2016-08-14 NOTE — Telephone Encounter (Signed)
Dr Lucia GaskinsAhern- do you know what patient is referring to?

## 2016-08-14 NOTE — Telephone Encounter (Signed)
Patient needs note from Dr. Lucia GaskinsAhern regarding her symptoms to give to her husband's employer. She came to the front desk requesting it today and it was not in the bin.   Best call back is [phone]

## 2016-08-14 NOTE — Telephone Encounter (Addendum)
Dr Lucia GaskinsAhern- FYI  Called patient back. She stated she forgot to call me back, she apologized. I relayed Dr Trevor MaceAhern's message below. She verbalized understanding. She declined referral to headache clinic in [place] She has been their previously and stated she was not happy with there care. She would like to stay under Dr Trevor MaceAhern's care. Advised I will give Dr Lucia GaskinsAhern the message. She states "I am very happy with you guys. I really feel that you are responsive and that Dr Lucia GaskinsAhern really listens to me".

## 2016-08-20 ENCOUNTER — Encounter (HOSPITAL_COMMUNITY)
Admission: RE | Admit: 2016-08-20 | Discharge: 2016-08-20 | Disposition: A | Discharge status: Home / Self Care | Payer: Medicare (Managed Care) | Source: Ambulatory Visit | Attending: Obstetrics & Gynecology | Admitting: Obstetrics & Gynecology

## 2016-08-28 ENCOUNTER — Telehealth: Payer: Self-pay | Admitting: Neurology

## 2016-08-28 NOTE — Telephone Encounter (Signed)
Called and LVM advising patient to proceed to ER per Dr Frances FurbishAthar. Also advised pt to f/u with GYN RE migraines and heavy menstrual periods. Gave [facility] phone number if she has further questions.

## 2016-08-28 NOTE — Telephone Encounter (Signed)
Patient called office back. She stated she was advised to call first before proceeding to ER. She is not sure what to tell them because the last time she went they were not sure how to treat her migraines. They had her go home and gave her benadryl.  I advised pt again that Dr Lucia GaskinsAhern out of office until tomorrow and per Dr Frances FurbishAthar, she should proceed to ER to get treatment. She should let them know what Dr Lucia GaskinsAhern has done for treatment so far. She verbalized understanding.  Advised if Dr Lucia GaskinsAhern wants to do anything different, I will call her back once Dr Lucia GaskinsAhern reviews everything.   Checked with Duwayne HeckDanielle on status of botox-in process of  Being submitted.

## 2016-08-28 NOTE — Telephone Encounter (Signed)
Dr Frances FurbishAthar- please advise. Dr Lucia GaskinsAhern note in the office today. You are listed as am work in, thank you

## 2016-08-28 NOTE — Telephone Encounter (Signed)
Patient called to advise, "Migraine since Sunday, butalbital-acetaminophen-caffeine (FIORICET, ESGIC) 50-325-40 MG tablet helps but as soon as it wears off, migraine is back as strong, if I keep taking Fioricet, it's going to cause re-bound headache, Dr. Lucia GaskinsAhern told me to call her 1st before going to ER, I had my period 2 weeks ago, yesterday I was so dizzy and heavy, I was walking into walls and everything". Please call [phone].

## 2016-08-28 NOTE — Telephone Encounter (Signed)
Patient has a Hx of severe migraines and is currently breastfeeding, according to Dr. Trevor MaceAhern's last note from September. I am not sure what to recommend at this point. Where are we with the botox authorization as that was also discussed? Patient may have to go to ER at this point.

## 2016-08-30 ENCOUNTER — Telehealth: Payer: Self-pay | Admitting: Neurology

## 2016-08-30 NOTE — Telephone Encounter (Incomplete)
I spoke to patient. Expressed my concern to patient. She had discussed another office regarding stolen botox at another office and apparently asked Duwayne HeckDanielle about reporting it. This is not our office issue, if there is an issue with botox at another completely unrelated non-[facility] office then our staff has no getting involved and patient needs to

## 2016-08-30 NOTE — Telephone Encounter (Signed)
I spoke to patient. Expressed my concern to patient. She had discussed stolen botox occurring at another office to our staff and apparently asked our staff about reporting it for her or how to report it. This is not our office issue, if there is an issue with botox at another completely unrelated non-[facility] office then please don't involve our staff, our staff should not be involved and patient needs to contact authorities or other representative. Patient said she just wanted to know what botox rep to talk to, and I answered we don't know and again we can;t help her with this. She has also mentioned several incidents about unhappiness with staff to other staff. I have been witness to a conversation she mentions and staff member was very pleasant, in fact we have documented over 30 phone calls with patient and been very attentive to all calls and happy to talk to her to my knowledge. I have asked patient that if she has concerns she should express them directly to me instead of to different staff around the office so that we can discuss it together.   There is a contraindication to using botox while breastfeeding. Patient says she has is having trouble weaning her child. In this case we cannit continue at this time with botox for migraibe. Also there is no contraindication to using botox with hemiplegic migraine but there is also little data on these particular migraine types and I cannot ensure there will not be other side effects that are not documented or seen in the clinical trials for chronic migraine sufferers. If patient would like to consider botox for migraine we can talk more at an appointment.

## 2016-08-30 NOTE — Telephone Encounter (Signed)
Patient called and wanted to know if it would be safe to receive the botox since she is breastfeeding. We went ahead and scheduled an apt for her because she is very interested in receiving the injections and I told her we could also cancel the apt if need be. Please call and advise.

## 2016-08-30 NOTE — Telephone Encounter (Signed)
Danielle, I am not comfortable performing botox with breastfeeding please cancel her botox. There are no contraindications for hemiplegic migraine but also no data on this which is another reasin. Left patient a message will try her back. thanks

## 2016-09-03 ENCOUNTER — Encounter: Payer: Self-pay | Admitting: Neurology

## 2016-09-12 ENCOUNTER — Ambulatory Visit: Payer: Medicare (Managed Care) | Admitting: Neurology

## 2016-09-19 ENCOUNTER — Encounter (HOSPITAL_COMMUNITY)
Admission: RE | Admit: 2016-09-19 | Discharge: 2016-09-19 | Disposition: A | Discharge status: Home / Self Care | Payer: Medicare (Managed Care) | Source: Ambulatory Visit | Attending: Obstetrics & Gynecology | Admitting: Obstetrics & Gynecology

## 2016-10-19 ENCOUNTER — Ambulatory Visit (HOSPITAL_COMMUNITY)
Admission: RE | Admit: 2016-10-19 | Discharge: 2016-10-19 | Disposition: A | Discharge status: Home / Self Care | Payer: Medicare (Managed Care) | Source: Ambulatory Visit | Attending: Obstetrics & Gynecology | Admitting: Obstetrics & Gynecology

## 2016-11-18 ENCOUNTER — Ambulatory Visit (HOSPITAL_COMMUNITY)
Admission: RE | Admit: 2016-11-18 | Discharge: 2016-11-18 | Disposition: A | Discharge status: Home / Self Care | Payer: Medicare (Managed Care) | Source: Ambulatory Visit | Attending: Obstetrics & Gynecology | Admitting: Obstetrics & Gynecology

## 2016-11-19 DIAGNOSIS — M797 Fibromyalgia: Secondary | ICD-10-CM

## 2016-11-19 HISTORY — DX: Fibromyalgia: M79.7

## 2016-11-27 ENCOUNTER — Encounter: Payer: Self-pay | Admitting: Osteopathic Medicine

## 2016-11-27 ENCOUNTER — Ambulatory Visit (INDEPENDENT_AMBULATORY_CARE_PROVIDER_SITE_OTHER): Payer: Medicare Other | Admitting: Osteopathic Medicine

## 2016-11-27 VITALS — BP 114/67 | HR 100 | Ht 66.0 in | Wt 126.0 lb

## 2016-11-27 DIAGNOSIS — J302 Other seasonal allergic rhinitis: Secondary | ICD-10-CM | POA: Diagnosis not present

## 2016-11-27 DIAGNOSIS — G43409 Hemiplegic migraine, not intractable, without status migrainosus: Secondary | ICD-10-CM

## 2016-11-27 MED ORDER — NORETHINDRONE 0.35 MG PO TABS: 1.0000 | ORAL_TABLET | Freq: Every day | ORAL | 11 refills | Status: DC

## 2016-11-27 MED ORDER — LORATADINE-PSEUDOEPHEDRINE ER 10-240 MG PO TB24: 1.0000 | ORAL_TABLET | Freq: Every day | ORAL | 11 refills | Status: DC

## 2016-11-27 NOTE — Progress Notes (Signed)
HPI: Holly [name] is a 41 y.o. female  who presents to [name] today, 11/27/16,  for chief complaint of:  Chief Complaint  Patient presents with  . Annual Exam  . Gynecologic Exam     Migraine: Long-standing history. Significant frequency of hemiplegic migraines with symptoms of neurologic deficit when she has these, including weakness and occasionally confusion. She is currently following with neurology. She states her neurologist asked her to discuss getting back on a progesterone only birth control but her OB/GYN declined to prescribe this per the patient. She had previously been on progesterone only oral contraception and states that her headaches were better on this medication. She is currently on Topamax 200 mg daily at bedtime, baclofen 10 mg daily at bedtime, she tried increased frequency of these medications but found that mental fogginess was not tolerable. She uses Fioricet sparingly as needed for severe headaches.  Seasonal allergies: Requests prescription for Claritin-D.     Past medical, surgical, social and family history reviewed: Patient Active Problem List   Diagnosis Date Noted  . Intractable chronic migraine without aura and with status migrainosus 04/08/2016  . Normal labor 07/07/2015  . NSVD (normal spontaneous vaginal delivery) 07/07/2015  . GERD 04/27/2010  . WOUND OPEN, KNEE/LEG/ANKLE W/O COMPLICATION 04/27/2010   Past Surgical History:  Procedure Laterality Date  . WRIST SURGERY     cyst removal   Social History  Substance Use Topics  . Smoking status: Never Smoker  . Smokeless tobacco: Never Used  . Alcohol use No   Family History  Problem Relation Age of Onset  . Thyroid disease Mother   . Scoliosis Mother   . Depression Mother   . Migraines Mother   . Hyperlipidemia Mother   . Hypertension Mother   . Heart attack Mother   . Crohn's disease Father   . Autoimmune disease Father   . Factor V Leiden  deficiency Father   . Heart failure Father   . Diabetes Father   . Hyperlipidemia Father   . Heart attack Father   . Cancer Sister   . Depression Sister   . Diabetes Sister   . Diabetes Paternal Grandfather      Current medication list and allergy/intolerance information reviewed:   Current Outpatient Prescriptions  Medication Sig Dispense Refill  . baclofen (LIORESAL) 10 MG tablet     . butalbital-acetaminophen-caffeine (FIORICET, ESGIC) 50-325-40 MG tablet Take 1 tablet by mouth every 6 (six) hours as needed for headache. 15 tablet 5  . loratadine-pseudoephedrine (CLARITIN-D 24-HOUR) 10-240 MG 24 hr tablet Take by mouth.    . Prenatal Vit-Fe Fumarate-FA (PRENATAL MULTIVITAMIN) TABS tablet Take 1 tablet by mouth at bedtime.    . ranitidine (ZANTAC) 150 MG tablet Take 150 mg by mouth 2 (two) times daily.    . sertraline (ZOLOFT) 50 MG tablet Take 1 tablet (50 mg) by mouth daily  3  . topiramate (TOPAMAX) 200 MG tablet Take 1 tablet (200 mg total) by mouth at bedtime. 30 tablet 12   No current facility-administered medications for this visit.    Allergies  Allergen Reactions  . Terbutaline Palpitations  . Sumatriptan   . Adhesive [Tape] Itching and Rash  . Ciprofloxacin Rash  . Zyrtec Allergy [Cetirizine Hcl] Other (See Comments)    Overly sleepy      Review of Systems:  Constitutional:  No  fever, no chills, No recent illness,  HEENT: +headache, no vision change, no hearing change, No sore  throat, No  sinus pressure  Cardiac: No  chest pain, No  pressure, No palpitations, No  Orthopnea  Respiratory:  No  shortness of breath. No  Cough  Gastrointestinal: No  abdominal pain, No  nausea, No  vomiting  Musculoskeletal: No new myalgia/arthralgia  Skin: No  Rash, No other wounds/concerning lesions  Hem/Onc: No  easy bruising/bleeding, No  abnormal lymph node  Endocrine: No cold intolerance,  No heat intolerance.  Neurologic: No  weakness, No  dizziness, No  slurred  speech/focal weakness/facial droop  Psychiatric: No  concerns with depression, No  concerns with anxiety, No sleep problems, No mood problems  Exam:  BP 114/67   Pulse 100   Ht 5\' 6"  (1.676 m)   Wt 126 lb (57.2 kg)   BMI 20.34 kg/m   Constitutional: VS see above. General Appearance: alert, well-developed, well-nourished, NAD  Eyes: Normal lids and conjunctive, non-icteric sclera  Ears, Nose, Mouth, Throat: MMM, Normal external inspection ears/nares/mouth/lips/gums.  Neck: No masses, trachea midline. No thyroid enlargement. No tenderness/mass appreciated. No lymphadenopathy  Respiratory: Normal respiratory effort. no wheeze, no rhonchi, no rales  Cardiovascular: S1/S2 normal, no murmur, no rub/gallop auscultated. RRR. No lower extremity edema.   Gastrointestinal: Nontender, no masses. No hepatomegaly, no splenomegaly. No hernia appreciated. Bowel sounds normal. Rectal exam deferred.   Musculoskeletal: Gait normal. No clubbing/cyanosis of digits.   Neurological: Normal balance/coordination. No tremor. No cranial nerve deficit on limited exam. Motor and sensation intact and symmetric. Cerebellar reflexes intact.   Skin: warm, dry, intact. No rash/ulcer. No concerning nevi or subq nodules on limited exam.    Psychiatric: Normal judgment/insight. Normal mood and affect. Oriented x3.      ASSESSMENT/PLAN:   Trial progesterone only contraception with mini pill, patient was educated on risks versus benefits of this medication, has tolerated it well in the past and it decreased the frequency and intensity of her headaches which typically get worse around ovulation and menstruation. Spent some time counseling the patient on other contraceptive options as well: She had some questions about sterilization but this would not really affect hormone levels, she declines use of IUD or arm implant or Depo shot.  Prescription written for Claritin-D to use as needed for seasonal allergies  Plan  to follow-up in 6 weeks for annual physical/well woman exam, if doing well on progesterone pill can continue of course  Hemiplegic migraine without status migrainosus, not intractable - Plan: norethindrone (MICRONOR,CAMILA,ERRIN) 0.35 MG tablet  Chronic seasonal allergic rhinitis, unspecified trigger - Plan: loratadine-pseudoephedrine (CLARITIN-D 24-HOUR) 10-240 MG 24 hr tablet      Visit summary with medication list and pertinent instructions was printed for patient to review. All questions at time of visit were answered - patient instructed to contact office with any additional concerns. ER/RTC precautions were reviewed with the patient. Follow-up plan: Return in about 6 weeks (around 01/08/2017) [facility] sooner if needed.  Note: Total time spent 30 minutes, greater than 50% of the visit was spent face-to-face counseling and coordinating care for the following: The primary encounter diagnosis was Hemiplegic migraine without status migrainosus, not intractable. A diagnosis of Chronic seasonal allergic rhinitis, unspecified trigger was also pertinent to this visit..Marland Kitchen

## 2016-11-29 ENCOUNTER — Ambulatory Visit (INDEPENDENT_AMBULATORY_CARE_PROVIDER_SITE_OTHER): Payer: Medicare Other | Admitting: Osteopathic Medicine

## 2016-11-29 ENCOUNTER — Encounter: Payer: Self-pay | Admitting: Osteopathic Medicine

## 2016-11-29 VITALS — BP 119/60 | HR 84 | Wt 128.0 lb

## 2016-11-29 DIAGNOSIS — Z3201 Encounter for pregnancy test, result positive: Secondary | ICD-10-CM | POA: Diagnosis not present

## 2016-11-29 LAB — POCT URINE PREGNANCY: Preg Test, Ur: POSITIVE — AB

## 2016-11-29 NOTE — Progress Notes (Signed)
HPI: Holly [name] is a 41 y.o. female  who presents to [facility] today, 11/29/16,  for chief complaint of:  Chief Complaint  Patient presents with  . positive pegnancy test    (+) pregnancy test at home, pt desires termination due to hx high risk pregnancy, advanced maternal age, medications.   Past medical, surgical, social and family history reviewed: Patient Active Problem List   Diagnosis Date Noted  . Chronic seasonal allergic rhinitis 11/27/2016  . Intractable chronic migraine without aura and with status migrainosus 04/08/2016  . Normal labor 07/07/2015  . NSVD (normal spontaneous vaginal delivery) 07/07/2015  . GERD 04/27/2010  . WOUND OPEN, KNEE/LEG/ANKLE W/O COMPLICATION 04/27/2010   Past Surgical History:  Procedure Laterality Date  . WRIST SURGERY     cyst removal   Social History  Substance Use Topics  . Smoking status: Never Smoker  . Smokeless tobacco: Never Used  . Alcohol use No   Family History  Problem Relation Age of Onset  . Thyroid disease Mother   . Scoliosis Mother   . Depression Mother   . Migraines Mother   . Hyperlipidemia Mother   . Hypertension Mother   . Heart attack Mother   . Crohn's disease Father   . Autoimmune disease Father   . Factor V Leiden deficiency Father   . Heart failure Father   . Diabetes Father   . Hyperlipidemia Father   . Heart attack Father   . Cancer Sister   . Depression Sister   . Diabetes Sister   . Diabetes Paternal Grandfather      Current medication list and allergy/intolerance information reviewed:   Current Outpatient Prescriptions on File Prior to Visit  Medication Sig Dispense Refill  . baclofen (LIORESAL) 10 MG tablet Take 10 mg by mouth at bedtime.     . butalbital-acetaminophen-caffeine (FIORICET, ESGIC) 50-325-40 MG tablet Take 1 tablet by mouth every 6 (six) hours as needed for headache. 15 tablet 5  . loratadine-pseudoephedrine (CLARITIN-D 24-HOUR)  10-240 MG 24 hr tablet Take 1 tablet by mouth daily. 30 tablet 11  . norethindrone (MICRONOR,CAMILA,ERRIN) 0.35 MG tablet Take 1 tablet (0.35 mg total) by mouth daily. 1 Package 11  . Prenatal Vit-Fe Fumarate-FA (PRENATAL MULTIVITAMIN) TABS tablet Take 1 tablet by mouth at bedtime.    . ranitidine (ZANTAC) 150 MG tablet Take 150 mg by mouth 2 (two) times daily.    . sertraline (ZOLOFT) 50 MG tablet Take 1 tablet (50 mg) by mouth daily  3  . topiramate (TOPAMAX) 200 MG tablet Take 1 tablet (200 mg total) by mouth at bedtime. 30 tablet 12   No current facility-administered medications on file prior to visit.    Allergies  Allergen Reactions  . Terbutaline Palpitations  . Sumatriptan   . Adhesive [Tape] Itching and Rash  . Ciprofloxacin Rash  . Zyrtec Allergy [Cetirizine Hcl] Other (See Comments)    Overly sleepy      Review of Systems:  Constitutional: No recent illness  HEENT: No  headache, no vision change  Cardiac: No  chest pain, No  pressure, No palpitations  Respiratory:  No  shortness of breath. No  Cough  Gastrointestinal: No  abdominal pain, no change on bowel habits  Exam:  BP 119/60   Pulse 84   Wt 128 lb (58.1 kg)   BMI 20.66 kg/m   Constitutional: VS see above. General Appearance: alert, well-developed, well-nourished, NAD  Respiratory: Normal respiratory effort. no wheeze,  no rhonchi, no rales  Cardiovascular: S1/S2 normal, no murmur, no rub/gallop auscultated. RRR.   Psychiatric: Normal judgment/insight. Normal mood and affect. Oriented x3.      ASSESSMENT/PLAN:   Not being totally familiar with the details of [place] law, I attempted to make a few calls to local organizations which may be helpful but was unable to get through. Based on my research, I believe that a physician or other qualified medical provider needs to witness the patient take the abortion pills, I believe this does have to happen in a facility with proper certification for  this procedure which I doubt this clinic meet those requirements. Patient agrees to go ahead and contact her OB/GYN for further guidance on this, and happy to help in any way I can if needed, just call me.  Positive pregnancy test - Plan: POCT urine pregnancy, Ambulatory referral to Obstetrics / Gynecology      Visit summary with medication list and pertinent instructions was printed for patient to review. All questions at time of visit were answered - patient instructed to contact office with any additional concerns. ER/RTC precautions were reviewed with the patient. Follow-up plan: Return for Routine care as previously directed.

## 2016-12-17 ENCOUNTER — Ambulatory Visit (HOSPITAL_COMMUNITY)
Admission: RE | Admit: 2016-12-17 | Discharge: 2016-12-17 | Disposition: A | Discharge status: Home / Self Care | Payer: Medicare Other | Source: Ambulatory Visit | Attending: Obstetrics & Gynecology | Admitting: Obstetrics & Gynecology

## 2017-01-08 ENCOUNTER — Ambulatory Visit: Payer: Medicare (Managed Care) | Admitting: Osteopathic Medicine

## 2017-01-09 ENCOUNTER — Ambulatory Visit (INDEPENDENT_AMBULATORY_CARE_PROVIDER_SITE_OTHER): Payer: Medicare Other | Admitting: Osteopathic Medicine

## 2017-01-09 ENCOUNTER — Other Ambulatory Visit (HOSPITAL_COMMUNITY)
Admission: RE | Admit: 2017-01-09 | Discharge: 2017-01-09 | Disposition: A | Discharge status: Home / Self Care | Payer: Medicare Other | Source: Ambulatory Visit | Attending: Osteopathic Medicine | Admitting: Osteopathic Medicine

## 2017-01-09 ENCOUNTER — Encounter: Payer: Self-pay | Admitting: Osteopathic Medicine

## 2017-01-09 VITALS — BP 115/68 | HR 90 | Ht 66.0 in | Wt 127.0 lb

## 2017-01-09 DIAGNOSIS — Z1151 Encounter for screening for human papillomavirus (HPV): Secondary | ICD-10-CM | POA: Insufficient documentation

## 2017-01-09 DIAGNOSIS — Z0000 Encounter for general adult medical examination without abnormal findings: Secondary | ICD-10-CM

## 2017-01-09 DIAGNOSIS — Z862 Personal history of diseases of the blood and blood-forming organs and certain disorders involving the immune mechanism: Secondary | ICD-10-CM

## 2017-01-09 DIAGNOSIS — Z01411 Encounter for gynecological examination (general) (routine) with abnormal findings: Secondary | ICD-10-CM | POA: Insufficient documentation

## 2017-01-09 DIAGNOSIS — F339 Major depressive disorder, recurrent, unspecified: Secondary | ICD-10-CM | POA: Insufficient documentation

## 2017-01-09 DIAGNOSIS — G43719 Chronic migraine without aura, intractable, without status migrainosus: Secondary | ICD-10-CM | POA: Diagnosis not present

## 2017-01-09 DIAGNOSIS — Z01419 Encounter for gynecological examination (general) (routine) without abnormal findings: Secondary | ICD-10-CM | POA: Diagnosis not present

## 2017-01-09 DIAGNOSIS — R946 Abnormal results of thyroid function studies: Secondary | ICD-10-CM

## 2017-01-09 DIAGNOSIS — R87810 Cervical high risk human papillomavirus (HPV) DNA test positive: Secondary | ICD-10-CM | POA: Insufficient documentation

## 2017-01-09 DIAGNOSIS — R7989 Other specified abnormal findings of blood chemistry: Secondary | ICD-10-CM

## 2017-01-09 NOTE — Progress Notes (Signed)
HPI: Holly [name] is a 41 y.o. female  who presents to [facility] today, 01/09/17,  for chief complaint of:  Chief Complaint  Patient presents with  . Annual Exam  . Gynecologic Exam    Migraine - stable, following with neurology, considering second opinion re; chronic migraine treatment.   Recent EAB - vaginal bleeding few weeks but has decreased at this point, no foul discharge or lower abdominal pain   Depression - stable on current meds, has trouble coping with severe headache pain when migraines are severe. No SI/plan.   See below for review of preventive care   Past medical, surgical, social and family history reviewed: Patient Active Problem List   Diagnosis Date Noted  . Chronic seasonal allergic rhinitis 11/27/2016  . Intractable chronic migraine without aura and with status migrainosus 04/08/2016  . Normal labor 07/07/2015  . NSVD (normal spontaneous vaginal delivery) 07/07/2015  . GERD 04/27/2010  . WOUND OPEN, KNEE/LEG/ANKLE W/O COMPLICATION 04/27/2010   Past Surgical History:  Procedure Laterality Date  . WRIST SURGERY     cyst removal   Social History  Substance Use Topics  . Smoking status: Never Smoker  . Smokeless tobacco: Never Used  . Alcohol use No   Family History  Problem Relation Age of Onset  . Thyroid disease Mother   . Scoliosis Mother   . Depression Mother   . Migraines Mother   . Hyperlipidemia Mother   . Hypertension Mother   . Heart attack Mother   . Crohn's disease Father   . Autoimmune disease Father   . Factor V Leiden deficiency Father   . Heart failure Father   . Diabetes Father   . Hyperlipidemia Father   . Heart attack Father   . Cancer Sister   . Depression Sister   . Diabetes Sister   . Diabetes Paternal Grandfather      Current medication list and allergy/intolerance information reviewed:   Current Outpatient Prescriptions  Medication Sig Dispense Refill  . baclofen  (LIORESAL) 10 MG tablet Take 10 mg by mouth at bedtime.     . butalbital-acetaminophen-caffeine (FIORICET, ESGIC) 50-325-40 MG tablet Take 1 tablet by mouth every 6 (six) hours as needed for headache. 15 tablet 5  . loratadine-pseudoephedrine (CLARITIN-D 24-HOUR) 10-240 MG 24 hr tablet Take 1 tablet by mouth daily. 30 tablet 11  . norethindrone (MICRONOR,CAMILA,ERRIN) 0.35 MG tablet Take 1 tablet (0.35 mg total) by mouth daily. 1 Package 11  . Prenatal Vit-Fe Fumarate-FA (PRENATAL MULTIVITAMIN) TABS tablet Take 1 tablet by mouth at bedtime.    . ranitidine (ZANTAC) 150 MG tablet Take 150 mg by mouth 2 (two) times daily.    . sertraline (ZOLOFT) 50 MG tablet Take 1 tablet (50 mg) by mouth daily  3  . topiramate (TOPAMAX) 200 MG tablet Take 1 tablet (200 mg total) by mouth at bedtime. 30 tablet 12   No current facility-administered medications for this visit.    Allergies  Allergen Reactions  . Terbutaline Palpitations  . Sumatriptan   . Adhesive [Tape] Itching and Rash  . Ciprofloxacin Rash  . Zyrtec Allergy [Cetirizine Hcl] Other (See Comments)    Overly sleepy      Review of Systems:  Constitutional:  No  fever, no chills, No recent illness,   HEENT: +headache,   Cardiac: No  chest pain, No  pressure, No palpitations, No  Orthopnea  Respiratory:  No  shortness of breath. No  Cough  Gastrointestinal:  No  abdominal pain, No  nausea,   Musculoskeletal: No new myalgia/arthralgia  Genitourinary: No  incontinence, +abnormal genital bleeding s/p EAB, No abnormal genital discharge  Skin: No  Rash, No other wounds/concerning lesions,   Neurologic: No  weakness, No  dizziness, No  slurred speech/focal weakness/facial droop  Psychiatric: +concerns with depression, No  concerns with anxiety, No sleep problems, No mood problems  Exam:  BP 115/68   Pulse 90   Ht 5\' 6"  (1.676 m)   Wt 127 lb (57.6 kg)   BMI 20.50 kg/m   Constitutional: VS see above. General Appearance: alert,  well-developed, well-nourished, NAD  Eyes: Normal lids and conjunctive, non-icteric sclera  Ears, Nose, Mouth, Throat: MMM, Normal external inspection ears/nares/mouth/lips/gums.   Neck: No masses, trachea midline. No thyroid enlargement. No tenderness/mass appreciated. No lymphadenopathy  Respiratory: Normal respiratory effort. no wheeze, no rhonchi, no rales  Cardiovascular: S1/S2 normal, no murmur, no rub/gallop auscultated. RRR. No lower extremity edema.  Musculoskeletal: Gait normal. No clubbing/cyanosis of digits.   Neurological: Normal balance/coordination. No tremor. No cranial nerve deficit on limited exam. Motor and sensation intact and symmetric. Cerebellar reflexes intact.   Skin: warm, dry, intact. No rash/ulcer.  Psychiatric: Normal judgment/insight. Normal mood and affect. Oriented x3.  GYN: No lesions/ulcers to external genitalia, normal urethra, normal vaginal mucosa, physiologic discharge and some blood in vaginal vault, cervix multiparous normal without lesions, uterus not enlarged or tender, adnexa no masses and nontender     ASSESSMENT/PLAN:   Annual physical exam - Plan: CBC with Differential/Platelet, COMPLETE METABOLIC PANEL WITH GFR, Lipid panel, VITAMIN D 25 Hydroxy (Vit-D Deficiency, Fractures), Thyroid Panel With TSH, Cytology - PAP  History of anemia - Plan: CBC with Differential/Platelet  Abnormal thyroid blood test - Plan: Thyroid Panel With TSH  Intractable chronic migraine without aura and without status migrainosus  Depression, recurrent (HCC)   FEMALE PREVENTIVE CARE Updated 01/09/17   ANNUAL SCREENING/COUNSELING  Diet/Exercise - HEALTHY HABITS DISCUSSED TO DECREASE CV RISK History  Smoking Status  . Never Smoker  Smokeless Tobacco  . Never Used   History  Alcohol Use No    Comment: none    Domestic violence concerns - no  HTN SCREENING - SEE VITALS  SEXUAL HEALTH  Sexually active in the past year - Yes with  female.  Need/want STI testing today? - no  Concerns about libido or pain with sex? - no  Plans for pregnancy? - husband getting vasectomy 01/2017  INFECTIOUS DISEASE SCREENING  HIV - does not need  GC/CT - does not need  HepC - DOB [birth year] - does not need  TB - does not need  DISEASE SCREENING  Lipid - will get today   DM2 - (+)FH - father (Type II) and sister (Type I)  Osteoporosis - women age 13+ - does not need  CANCER SCREENING  Cervical - Hx abnormal  Breast - does not need - no FH in 1st degree relative   Lung - does not need  Colon - does not need - no FH in 1st degree relative   ADULT VACCINATION  Influenza - annual vaccine recommended  Td - booster every 10 years   Zoster - option at 50, yes at 60+   PCV13 - was not indicated  PPSV23 - was not indicated Immunization History  Administered Date(s) Administered  . Influenza-Unspecified 08/19/2016, 09/02/2016  . Tdap 08/14/2009, 05/02/2015       Visit summary with medication list and pertinent instructions was printed for  patient to review. All questions at time of visit were answered - patient instructed to contact office with any additional concerns. ER/RTC precautions were reviewed with the patient. Follow-up plan: Return in about 1 year (around 01/09/2018) [facility] sooner if needed.

## 2017-01-16 LAB — CYTOLOGY - PAP
Diagnosis: NEGATIVE
HPV 16/18/45 genotyping: NEGATIVE
HPV: DETECTED — AB

## 2017-01-25 DIAGNOSIS — R87610 Atypical squamous cells of undetermined significance on cytologic smear of cervix (ASC-US): Secondary | ICD-10-CM | POA: Diagnosis not present

## 2017-01-29 ENCOUNTER — Encounter: Payer: Medicare Other | Admitting: Obstetrics and Gynecology

## 2017-02-05 ENCOUNTER — Telehealth: Payer: Self-pay

## 2017-02-05 ENCOUNTER — Ambulatory Visit: Payer: Medicare (Managed Care) | Admitting: Neurology

## 2017-02-05 ENCOUNTER — Encounter: Payer: Self-pay | Admitting: Neurology

## 2017-02-05 NOTE — Telephone Encounter (Signed)
Appt was r/s'd

## 2017-02-05 NOTE — Telephone Encounter (Signed)
Pt no-showed her appt this morning. 

## 2017-02-12 DIAGNOSIS — Z13220 Encounter for screening for lipoid disorders: Secondary | ICD-10-CM | POA: Diagnosis not present

## 2017-02-12 DIAGNOSIS — R946 Abnormal results of thyroid function studies: Secondary | ICD-10-CM | POA: Diagnosis not present

## 2017-02-12 DIAGNOSIS — Z862 Personal history of diseases of the blood and blood-forming organs and certain disorders involving the immune mechanism: Secondary | ICD-10-CM | POA: Diagnosis not present

## 2017-02-12 DIAGNOSIS — Z0000 Encounter for general adult medical examination without abnormal findings: Secondary | ICD-10-CM | POA: Diagnosis not present

## 2017-02-13 LAB — THYROID PANEL WITH TSH
Free Thyroxine Index: 1.9 (ref 1.4–3.8)
T3 Uptake: 31 % (ref 22–35)
T4, Total: 6.1 ug/dL (ref 4.5–12.0)
TSH: 1.14 mIU/L

## 2017-02-13 LAB — CBC WITH DIFFERENTIAL/PLATELET
Basophils Absolute: 0 cells/uL (ref 0–200)
Basophils Relative: 0 %
Eosinophils Absolute: 189 cells/uL (ref 15–500)
Eosinophils Relative: 3 %
HCT: 38.6 % (ref 35.0–45.0)
Hemoglobin: 12.7 g/dL (ref 11.7–15.5)
Lymphocytes Relative: 33 %
Lymphs Abs: 2079 cells/uL (ref 850–3900)
MCH: 30.4 pg (ref 27.0–33.0)
MCHC: 32.9 g/dL (ref 32.0–36.0)
MCV: 92.3 fL (ref 80.0–100.0)
MPV: 8.8 fL (ref 7.5–12.5)
Monocytes Absolute: 315 cells/uL (ref 200–950)
Monocytes Relative: 5 %
Neutro Abs: 3717 cells/uL (ref 1500–7800)
Neutrophils Relative %: 59 %
Platelets: 256 10*3/uL (ref 140–400)
RBC: 4.18 MIL/uL (ref 3.80–5.10)
RDW: 13.7 % (ref 11.0–15.0)
WBC: 6.3 10*3/uL (ref 3.8–10.8)

## 2017-02-13 LAB — COMPLETE METABOLIC PANEL WITH GFR
ALT: 8 U/L (ref 6–29)
AST: 12 U/L (ref 10–30)
Albumin: 4.2 g/dL (ref 3.6–5.1)
Alkaline Phosphatase: 56 U/L (ref 33–115)
BUN: 12 mg/dL (ref 7–25)
CO2: 26 mmol/L (ref 20–31)
Calcium: 8.6 mg/dL (ref 8.6–10.2)
Chloride: 107 mmol/L (ref 98–110)
Creat: 0.91 mg/dL (ref 0.50–1.10)
GFR, Est African American: >89 mL/min (ref >=60)
GFR, Est Non African American: 80 mL/min (ref >=60)
Glucose, Bld: 88 mg/dL (ref 65–99)
Potassium: 3.8 mmol/L (ref 3.5–5.3)
Sodium: 138 mmol/L (ref 135–146)
Total Bilirubin: 0.5 mg/dL (ref 0.2–1.2)
Total Protein: 7 g/dL (ref 6.1–8.1)

## 2017-02-13 LAB — VITAMIN D 25 HYDROXY (VIT D DEFICIENCY, FRACTURES): Vit D, 25-Hydroxy: 33 ng/mL (ref 30–100)

## 2017-02-13 LAB — LIPID PANEL
Cholesterol: 114 mg/dL (ref <200)
HDL: 49 mg/dL — ABNORMAL LOW (ref >50)
LDL Cholesterol: 57 mg/dL (ref <100)
Total CHOL/HDL Ratio: 2.3 Ratio (ref <5.0)
Triglycerides: 39 mg/dL (ref <150)
VLDL: 8 mg/dL (ref <30)

## 2017-02-14 ENCOUNTER — Other Ambulatory Visit: Payer: Self-pay | Admitting: Neurology

## 2017-02-14 MED ORDER — TOPIRAMATE ER 200 MG PO SPRINKLE CAP24: 200.0000 mg | EXTENDED_RELEASE_CAPSULE | Freq: Every day | ORAL | 11 refills | Status: DC

## 2017-02-18 ENCOUNTER — Telehealth: Payer: Self-pay | Admitting: Neurology

## 2017-02-18 NOTE — Telephone Encounter (Signed)
I have spoken with Takita this afternoon.  She sts. she has spoken with her insurance co. and they are not approving ER Topiramate b/c it is in capsule form.  Sts. she has to have capsules, and that Dr. Daisy Blossom has agreed that she must have capsules.  Sts. ins. will authorize  sprinkle capsules, or office can ?appeal decision not to cover current rx.  Will leave a message for Dr. Daisy Blossom and Victorino Dike, to see if Dr. Daisy Blossom is able to change rx. or appeal/fim

## 2017-02-18 NOTE — Telephone Encounter (Signed)
This is an addendum to e-mail from 02-05-2017 Pt has called back wanting to speak w/ someone re: the pre cert for the Topiramate ER (QUDEXY XR) 200 MG CS24 throught the insurance.  She has been told that she has the option of emailing the information to [employer] but feels she may leave out some key information. Her request is to tell a nurse what is needed to be relayed in hopes that the nurse can then relay it to the insurance company for her.

## 2017-02-19 MED ORDER — TOPIRAMATE 25 MG PO CPSP: 25.0000 mg | ORAL_CAPSULE | Freq: Every day | ORAL | 11 refills | Status: DC

## 2017-02-19 MED ORDER — TOPIRAMATE ER 25 MG PO SPRINKLE CAP24: 200.0000 mg | EXTENDED_RELEASE_CAPSULE | Freq: Every day | ORAL | 11 refills | Status: DC

## 2017-02-19 MED ORDER — TOPIRAMATE 25 MG PO CPSP: 200.0000 mg | ORAL_CAPSULE | Freq: Every day | ORAL | 11 refills | Status: DC

## 2017-02-19 NOTE — Telephone Encounter (Signed)
Re-sent rx for topiramate caps/sprinkles (not ER)

## 2017-02-19 NOTE — Telephone Encounter (Signed)
Weston Brass with [facility] pharmacy called office in reference to get clarification on Topiramate medication.  New rx for Topiramate ER 25 mg sprinkles per Weston Brass there is no ER with the sprinkles.  Please call

## 2017-02-19 NOTE — Addendum Note (Signed)
Addended by: Donnelly Angelica on: 02/19/2017 10:26 AM   Modules accepted: Orders

## 2017-02-19 NOTE — Telephone Encounter (Signed)
New rx e-scribed for topiramate ER 25 mg sprinkles.

## 2017-02-19 NOTE — Addendum Note (Signed)
Addended by: Donnelly Angelica on: 02/19/2017 09:41 AM   Modules accepted: Orders

## 2017-02-23 ENCOUNTER — Encounter: Payer: Self-pay | Admitting: Neurology

## 2017-02-25 ENCOUNTER — Ambulatory Visit (INDEPENDENT_AMBULATORY_CARE_PROVIDER_SITE_OTHER): Payer: Medicare Other | Admitting: Osteopathic Medicine

## 2017-02-25 VITALS — BP 111/74 | HR 94 | Temp 97.5°F | Resp 16 | Wt 126.0 lb

## 2017-02-25 DIAGNOSIS — G43719 Chronic migraine without aura, intractable, without status migrainosus: Secondary | ICD-10-CM

## 2017-02-25 DIAGNOSIS — N920 Excessive and frequent menstruation with regular cycle: Secondary | ICD-10-CM

## 2017-02-25 DIAGNOSIS — R87619 Unspecified abnormal cytological findings in specimens from cervix uteri: Secondary | ICD-10-CM | POA: Diagnosis not present

## 2017-02-25 DIAGNOSIS — Z0289 Encounter for other administrative examinations: Secondary | ICD-10-CM

## 2017-02-25 MED ORDER — BUTALBITAL-APAP-CAFFEINE 50-325-40 MG PO TABS: 1.0000 | ORAL_TABLET | Freq: Four times a day (QID) | ORAL | 5 refills | Status: DC | PRN

## 2017-02-25 NOTE — Progress Notes (Signed)
HPI: Holly [name] is a 41 y.o. female  who presents to [name] today, 02/25/17,  for chief complaint of:  Chief Complaint  Patient presents with  . Menorrhagia    Atypical migraine headaches: Chronic, has been following with neurology, would like referral to [facility] for second opinion/possible further evaluation.  GYN concern: Patient underwent elective abortion procedure earlier this year. Since that time periods have gotten to be closer together about every 21 days and bleeding has been a bit heavier went from 3-5 days to about 8 days altogether. Still experiencing significant migraines with ovulation portion of her cycle. Is noticing bleeding as well post-coil but also with orgasm even without penetration.  Past medical history, surgical history, social history and family history reviewed.  Patient Active Problem List   Diagnosis Date Noted  . Abnormal Pap smear of cervix 02/26/2017  . Depression, recurrent (HCC) 01/09/2017  . Chronic seasonal allergic rhinitis 11/27/2016  . Intractable chronic migraine without aura and with status migrainosus 04/08/2016  . NSVD (normal spontaneous vaginal delivery) 07/07/2015  . GERD 04/27/2010  . WOUND OPEN, KNEE/LEG/ANKLE W/O COMPLICATION 04/27/2010    Current medication list and allergy/intolerance information reviewed.   Current Outpatient Prescriptions on File Prior to Visit  Medication Sig Dispense Refill  . baclofen (LIORESAL) 10 MG tablet Take 10 mg by mouth at bedtime.     . butalbital-acetaminophen-caffeine (FIORICET, ESGIC) 50-325-40 MG tablet Take 1 tablet by mouth every 6 (six) hours as needed for headache. 15 tablet 5  . loratadine-pseudoephedrine (CLARITIN-D 24-HOUR) 10-240 MG 24 hr tablet Take 1 tablet by mouth daily. 30 tablet 11  . norethindrone (MICRONOR,CAMILA,ERRIN) 0.35 MG tablet Take 1 tablet (0.35 mg total) by mouth daily. 1 Package 11  . Prenatal Vit-Fe  Fumarate-FA (PRENATAL MULTIVITAMIN) TABS tablet Take 1 tablet by mouth at bedtime.    . ranitidine (ZANTAC) 150 MG tablet Take 150 mg by mouth 2 (two) times daily.    . sertraline (ZOLOFT) 50 MG tablet Take 1 tablet (50 mg) by mouth daily  3  . topiramate (TOPAMAX) 25 MG capsule Take 1 capsule (25 mg total) by mouth at bedtime. 240 capsule 11   No current facility-administered medications on file prior to visit.    Allergies  Allergen Reactions  . Terbutaline Palpitations  . Sumatriptan   . Adhesive [Tape] Itching and Rash  . Ciprofloxacin Rash  . Zyrtec Allergy [Cetirizine Hcl] Other (See Comments)    Overly sleepy      Review of Systems:  Constitutional: No recent illness  HEENT: +headache  Cardiac: No  chest pain, No  pressure  Gastrointestinal: No  abdominal pain, no change on bowel habits  Musculoskeletal: No new myalgia/arthralgia  GYN: as per HPI  Psychiatric: No  concerns with depression, No  concerns with anxiety  Exam:  BP 111/74 (BP Location: Left Arm, Patient Position: Sitting, Cuff Size: Normal)   Pulse 94   Temp 97.5 F (36.4 C) (Oral)   Resp 16   Wt 126 lb (57.2 kg)   SpO2 100%   BMI 20.34 kg/m   Constitutional: VS see above. General Appearance: alert, well-developed, well-nourished, NAD  Respiratory: Normal respiratory effort. no wheeze, no rhonchi, no rales  Cardiovascular: S1/S2 normal, no murmur, no rub/gallop auscultated. RRR.   Musculoskeletal: Gait normal. Symmetric and independent movement of all extremities  Neurological: Normal balance/coordination. No tremor.  Skin: warm, dry, intact.   Psychiatric: Normal judgment/insight. Normal mood and affect. Oriented  x3.  GYN: No lesions/ulcers to external genitalia, normal urethra, normal vaginal mucosa, physiologic discharge, cervix appears irritatied but without lesions, uterus not enlarged or tender, adnexa no masses and nontender     ASSESSMENT/PLAN:   Cervix appears irritated but  is nontender, slightly friable. History of abnormal Pap. Recommended follow-up with GYN for further discussion of this issue. Nothing I can see such as cervical polyp or other obvious anatomic abnormality to explain her bleeding pattern.  We'll go ahead and place referral to [facility] for second opinion for headache management.  Intractable chronic migraine without aura and without status migrainosus - Plan: AMB referral to headache clinic  Menorrhagia with regular cycle - pt to follow with her GYN. Low risk STI, declines testing at this time    Patient Instructions  Plan: 1. Referral to [facility] headache clinic - let us kenow if you don't hear back about this in one week 2. Would contact OBGYN re: bleeding and periods issues - I don't see any explanation for this on exam today     Follow-up plan: Return if symptoms worsen or fail to improve.  Visit summary with medication list and pertinent instructions was printed for patient to review, alert [place] if any changes needed. All questions at time of visit were answered - patient instructed to contact office with any additional concerns. ER/RTC precautions were reviewed with the patient and understanding verbalized.

## 2017-02-25 NOTE — Patient Instructions (Signed)
Plan: 1. Referral to [facility] headache clinic - let us kenow if you don't hear back about this in one week 2. Would contact OBGYN re: bleeding and periods issues - I don't see any explanation for this on exam today

## 2017-02-25 NOTE — Progress Notes (Signed)
Patient currently has migraine and has taken fioricetts x3 3 since last night. Also reporting heavy periods.

## 2017-02-25 NOTE — Telephone Encounter (Signed)
Rx signed and faxed to pharmacy

## 2017-02-25 NOTE — Telephone Encounter (Signed)
Fioricet rx printed, awaiting MD signature.

## 2017-02-26 ENCOUNTER — Ambulatory Visit: Payer: Self-pay | Admitting: Osteopathic Medicine

## 2017-02-26 DIAGNOSIS — R87613 High grade squamous intraepithelial lesion on cytologic smear of cervix (HGSIL): Secondary | ICD-10-CM | POA: Insufficient documentation

## 2017-02-26 DIAGNOSIS — H16143 Punctate keratitis, bilateral: Secondary | ICD-10-CM | POA: Diagnosis not present

## 2017-02-26 DIAGNOSIS — H5213 Myopia, bilateral: Secondary | ICD-10-CM | POA: Diagnosis not present

## 2017-02-26 DIAGNOSIS — H16223 Keratoconjunctivitis sicca, not specified as Sjogren's, bilateral: Secondary | ICD-10-CM | POA: Diagnosis not present

## 2017-02-26 DIAGNOSIS — R87619 Unspecified abnormal cytological findings in specimens from cervix uteri: Secondary | ICD-10-CM | POA: Insufficient documentation

## 2017-02-28 ENCOUNTER — Encounter: Payer: Self-pay | Admitting: Neurology

## 2017-02-28 ENCOUNTER — Telehealth: Payer: Self-pay | Admitting: Neurology

## 2017-02-28 DIAGNOSIS — N920 Excessive and frequent menstruation with regular cycle: Secondary | ICD-10-CM | POA: Diagnosis not present

## 2017-02-28 NOTE — Telephone Encounter (Signed)
MyChart mssg sent to pt to verify correct pharmacy.

## 2017-02-28 NOTE — Addendum Note (Signed)
Addended by: Donnelly Angelica on: 02/28/2017 05:35 PM   Modules accepted: Orders

## 2017-02-28 NOTE — Telephone Encounter (Addendum)
[facility] with [facility] in [place] is calling to get a new Rx for Trokendi  for the patient.

## 2017-02-28 NOTE — Telephone Encounter (Signed)
Received fax from [facility] T # [phone], F # [phone]. Topiramate 25 mg ER cap [id number] approved for non-formulary exception through 11/18/2017 under Medicare Part D benefit.

## 2017-03-04 MED ORDER — TOPIRAMATE ER 200 MG PO CAP24: 1.0000 | ORAL_CAPSULE | Freq: Every day | ORAL | 11 refills | Status: DC

## 2017-03-04 NOTE — Addendum Note (Signed)
Addended by: Donnelly Angelica on: 03/04/2017 12:21 PM   Modules accepted: Orders

## 2017-03-07 NOTE — Telephone Encounter (Signed)
PA initiated through CoverMyMeds for Trokendi XR 200 mg caps.

## 2017-03-07 NOTE — Telephone Encounter (Signed)
Request Reference Number: [id number]. TROKENDI XR CAP  is approved through 11/18/2017. For further questions, call [phone].

## 2017-03-12 ENCOUNTER — Ambulatory Visit (INDEPENDENT_AMBULATORY_CARE_PROVIDER_SITE_OTHER): Payer: Medicare Other | Admitting: Neurology

## 2017-03-12 VITALS — BP 111/67 | HR 102

## 2017-03-12 DIAGNOSIS — G43111 Migraine with aura, intractable, with status migrainosus: Secondary | ICD-10-CM | POA: Diagnosis not present

## 2017-03-12 MED ORDER — KETOROLAC TROMETHAMINE 60 MG/2ML IM SOLN
60.0000 mg | Freq: Once | INTRAMUSCULAR | Status: AC
  Administered 2017-03-12: 60 mg via INTRAMUSCULAR

## 2017-03-12 NOTE — Progress Notes (Signed)
Toradol 60 ml/2 ml administered IM to L deltoid using aseptic technique. Pt tolerated well.Bandaid applied.

## 2017-03-12 NOTE — Progress Notes (Signed)
[facility]    Provider:  Dr Lucia Gaskins Referring Provider: Sunnie Nielsen, DO Primary Care Physician:  Holly Nielsen, DO  CC:  Debilitating migraines  She is having more sensory symptoms with the migraines. When she has a migraine she may hear or see things that aren't there but may be due to severe pain during the migraine.  She is having more migraines. Her memory is worsening. Husband is missing a lot of days. She left her son as home the other day. She has changed birth control with irregular bleeding and her periods are more often. Recommended Nuvaring or other birth control methods that may improve menstrual migraines.  Meds tried: Topiramate,Amitrip, nortrip. Currently on topamax  daily. Also tried venlafaxine, imitrex, maxalt, treximet. Blood pressure medications are contraindicated due to her very low blood pressure, now trying topiramate ER.  , Interval History 08/07/2016:  Holly [name] is a 41 y.o. 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 She has severe migraines what impair her both physicilly and cognitively, she can't even remember her own name, she can;t walk straight, her balance is impaired, she has slurred speech, she stutters, can;t take care of her 6 children or at times herself when recurrence of migraines. 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 The last several have been extreme in her severity. She has gone to the emergency room multiple times for severe refractory migraines. Patient's migraines have improved with Topiramate but they are still occurring 3-4 days a week and can be severe when she has a migraine 10 days in a row. She takes Topiramate  daily. She is breastfeeding. When she has migraines she is debilitated often and has to stay in bed in a dark room all day and her husband has had to stay home. She  has 15-17 migraine days in a month. She is weaning her last child this last month and her hormones are fluctuating. Will have the Botox representative call.   Meds tried: Topiramate,Amitrip, nortrip. Currently on topamax  daily. Also tried venlafaxine, imitrex, maxalt, treximet. Blood pressure medications are contraindicated due to her very low blood pressure.   Holly [name] 05/29/2016: Today 04/29/2016: Holly [name] is a 41 year old female with a history of migraine headaches. She returns today for follow-up. She states that her headaches have improved with Topamax. She states she typically has 1-2 headaches a week. Before she was having 3-5 headaches a week. She is currently taken Topamax 150 mg at bedtime. She reports that with her migraines she is experiencing weakness on the right side typically in the arm but it can move to the leg. She also noticed slurring speech and stuttering. In the past the symptoms have been worrisome and she went to the emergency room thinking she was having a stroke. She was told that it was migraine headache. She reports that the symptoms never occur without a migraine. She uses caffeine, Tylenol, Advil and Fioricet in that order to treat her acute headaches. In the past she has tried triptans and Cambia with minimal benefit. She states baclofen was helpful for her headaches however it stopped her milk production. She plans to continue breast-feeding to the child is 4 months old. She returns today for an evaluation.    HPI 04/06/2016:Holly A Garrettis a 41 y.o.femalehere as a referral from Dr. Duanne [name]. She has had migraines for 21 years. She has tried  a lot of things. Migraines are affecting her speech and she is slurring stuttering with the migraines. She trained in nursing and psychology. She has daily headaches, migraines. They are on the left side 98% of the time, behind the eye, throbbing and pounding like a "pick axe", laying down in a dark room helps,  endorses extreme sounds sensitivity, light sensitivity, smell triggers, no aura. 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 No medication overuse. She had extensive testing at [facility] with MRIs and LPs. She has nausea and vomiting with the migraines. No inciting event, no head trauma, has a FHx of migraines. Lack of sleep worsens, drinks caffeine once a day. She drink enough fluid. She was bedridden for a whole year with migraines. She has daily migraines that can last all day.No other focal neurologic deficits. She does have musculoskeletal neck pain.  She has tried everything: Only Fioricet and Vicodin helps.That is the only thing she takes. She tried Amitrip, nortrip. Currently on topamax  daily. Also tried venlafaxine, imitrex, maxalt, treximet.   Never tried propranolol  Reviewed notes, labs and imaging from outside physicians, which showed:  CT if the head 2004: CLINICAL DATA: HEADACHE. RIGHT AND LEFT-SIDED NUMBNESS OF ARMS AND LEGS. BLURRED VISION FOR TWO WEEKS. TWO MONTHS POST PARTUM. PREVIOUS MRI OF THE BRAIN IN 1998.CT SCAN OF THE HEAD WITHOUT CONTRAST WITH BONE WINDOWS A SERIES OF SCANS OF THE ENTIRE HEAD ARE MADE WITHOUT CONTRAST AND SHOW NO EVIDENCE OF INTRACRANIAL MASS OR HEMORRHAGE. THERE IS NO SHIFT OF MIDLINE STRUCTURES. THE VENTRICULAR SYSTEM APPEARS NORMAL. BONE WINDOWS SHOW THE PARANASAL SINUSES, BASE OF THE SKULL, BONY CALVARIUM, AND INTERNAL AUDITORY CANALS TO BE NORMAL. IMPRESSION NORMAL CT SCAN OF THE HEAD WITHOUT CONTRAST.  RPR, HIV negative.   Reviewed notes from OBGyn: Pt has had migraines for 20 years. Patient reports continuous migraine for 3 months. She is on Topamax  qhs. She has tunnel vision with migraines. Cries unconsolably with blood in her tears. She has diarrhea with the Topamax but wants to continue it. She is nursing her baby and is almost done, decreased sleep and fatigue is worsening migraines. 5 trips to the ER in the  last 6 weeks. Imitrex gave her a panic attack and numbness in the face. Exam was unremarkable. On Zofran and Topiramate.  Review of Systems: Patient complains of symptoms per HPI as well as the following symptoms:joint pain, loss of vision, numbness, weakness, No CP, No SOB. Pertinent negatives per HPI. All others negative.    Social History   Social History  . Marital status: Married    Spouse name: N/A  . Number of children: 6  . Years of education: N/A   Occupational History  . Home Maker    Social History Main Topics  . Smoking status: Never Smoker  . Smokeless tobacco: Never Used  . Alcohol use No     Comment: none  . Drug use: No  . Sexual activity: Yes    Birth control/ protection: None   Other Topics Concern  . Not on file   Social History Narrative   1 caffeine drink a day     Family History  Problem Relation Age of Onset  . Thyroid disease Mother   . Scoliosis Mother   . Depression Mother   . Migraines Mother   . Hyperlipidemia Mother   . Hypertension Mother   . Heart attack Mother   . Crohn's disease Father   . Autoimmune disease  Father   . Factor V Leiden deficiency Father   . Heart failure Father   . Diabetes Father   . Hyperlipidemia Father   . Heart attack Father   . Cancer Sister   . Depression Sister   . Diabetes Sister   . Diabetes Paternal Grandfather     Past Medical History:  Diagnosis Date  . Depression   . GERD (gastroesophageal reflux disease)   . IBS (irritable bowel syndrome)   . Migraine   . MRSA (methicillin resistant staph aureus) culture positive     Past Surgical History:  Procedure Laterality Date  . WRIST SURGERY     cyst removal    Current Outpatient Prescriptions  Medication Sig Dispense Refill  . baclofen (LIORESAL) 10 MG tablet Take 10 mg by mouth at bedtime.     . butalbital-acetaminophen-caffeine (FIORICET, ESGIC) 50-325-40 MG tablet Take 1 tablet by mouth every 6 (six) hours as needed for headache. 15  tablet 5  . loratadine-pseudoephedrine (CLARITIN-D 24-HOUR) 10-240 MG 24 hr tablet Take 1 tablet by mouth daily. 30 tablet 11  . norethindrone (MICRONOR,CAMILA,ERRIN) 0.35 MG tablet Take 1 tablet (0.35 mg total) by mouth daily. 1 Package 11  . Prenatal Vit-Fe Fumarate-FA (PRENATAL MULTIVITAMIN) TABS tablet Take 1 tablet by mouth at bedtime.    . ranitidine (ZANTAC) 150 MG tablet Take 150 mg by mouth 2 (two) times daily.    . sertraline (ZOLOFT) 50 MG tablet Take 1 tablet (50 mg) by mouth daily  3  . Topiramate ER 200 MG CP24 Take 1 capsule by mouth at bedtime. 30 capsule 11   No current facility-administered medications for this visit.     Allergies as of 03/12/2017 - Review Complete 02/25/2017  Allergen Reaction Noted  . Terbutaline Palpitations 05/26/2015  . Sumatriptan  04/06/2016  . Adhesive [tape] Itching and Rash 12/30/2012  . Ciprofloxacin Rash 05/26/2015  . Zyrtec allergy [cetirizine hcl] Other (See Comments) 05/26/2015    Vitals: There were no vitals taken for this visit. Last Weight:  Wt Readings from Last 1 Encounters:  02/25/17 126 lb (57.2 kg)   Last Height:   Ht Readings from Last 1 Encounters:  01/09/17  (1.676 m)    Physical exam: Exam: Gen: NAD, conversant, well nourised, well groomed  CV: RRR, no MRG. No Carotid Bruits. No peripheral edema, warm, nontender Eyes: Conjunctivae clear without exudates or hemorrhage  Neuro: Detailed Neurologic Exam  Speech: Speech is normal; fluent and spontaneous with normal comprehension.  Cognition: The patient is oriented to person, place, and time;  recent and remote memory intact;  language fluent;  normal attention, concentration,  fund of knowledge Cranial Nerves: The pupils are equal, round, and reactive to light. The fundi are normal and spontaneous venous pulsations are present. Visual fields are full to finger confrontation. Extraocular movements are intact.  Trigeminal sensation is intact and the muscles of mastication are normal. The face is symmetric. The palate elevates in the midline. Hearing intact. Voice is normal. Shoulder shrug is normal. The tongue has normal motion without fasciculations.   Coordination: Normal finger to nose and heel to shin. Normal rapid alternating movements.   Gait: Heel-toe and tandem gait are normal.   Motor Observation: No asymmetry, no atrophy, and no involuntary movements noted. Tone: Normal muscle tone.   Posture: Posture is normal. normal erect  Strength: Strength is V/V in the upper and lower limbs.   Sensation: intact to LT  Reflex Exam:  DTR's: Deep tendon reflexes in  the upper and lower extremities are normal bilaterally.  Toes: The toes are downgoing bilaterally.  Clonus: Clonus is absent.     Assessment/Plan:41 year old with intractable chronicsevere migraines with hemiplegia and severe cognitive impairment with the migraine episodes.   [facility] - specialty pharmacy, will order. Transition to Topamax ER:  qhs zofran for nausea Baclofen prn for migraines Discussed botox for migraine and cgrp, at this point she has failed medications, is on high dose Topamax, feel botox is a necessary next step  To prevent or relieve headaches, try the following:  Cool Compress.Lie down and place a cool compress on your head.   Avoid headache triggers.If certain foods or odors seem to have triggered your migraines in the past, avoid them. A headache diary might help you identify triggers.   Include physical activity in your daily routine.Try a daily walk or other moderate aerobic exercise.   Manage stress.Find healthy ways to cope with the stressors, such as delegating tasks on your to-do list.   Practice relaxation techniques.Try deep breathing, yoga, massage and visualization.   Eat regularly.Eating regularly scheduled meals and  maintaining a healthy diet might help prevent headaches. Also, drink plenty of fluids.   Follow a regular sleep schedule.Sleep deprivation might contribute to headaches  Consider biofeedback.With this mind-body technique, you learn to control certain bodily functions -such as muscle tension, heart rate and blood pressure -to prevent headaches or reduce headache pain.    Proceed to emergency room if you experience new or worsening symptoms or symptoms do not resolve, if you have new neurologic symptoms or if headache is severe, or for any concerning symptom.    CC: Karleen Dolphin, MD  [facility] [address] [place], [place] [address]  Phone [phone] Fax [phone]  A total of 25 minutes was spent face-to-face with this patient. Over half this time was spent on counseling patient on the intractable migraine diagnosis and different diagnostic and therapeutic options available.

## 2017-03-12 NOTE — Patient Instructions (Signed)
Remember to drink plenty of fluid, eat healthy meals and do not skip any meals. Try to eat protein with a every meal and eat a healthy snack such as fruit or nuts in between meals. Try to keep a regular sleep-wake schedule and try to exercise daily, particularly in the form of walking, 20-30 minutes a day, if you can.   As far as your medications are concerned, I would like to suggest: Transition to Trokendi   I would like to see you back in 6 months, sooner if we need to. Please call us with any interim questions, concerns, problems, updates or refill requests.   Our phone number is [phone]. We also have an after hours call service for urgent matters and there is a physician on-call for urgent questions. For any emergencies you know to call 911 or go to the nearest emergency room  Topiramate extended-release capsules What is this medicine? TOPIRAMATE (toe PYRE a mate) is used to treat seizures in adults or children with epilepsy. It is also used for the prevention of migraine headaches. This medicine may be used for other purposes; ask your health care provider or pharmacist if you have questions. COMMON BRAND NAME(S): Trokendi XR What should I tell my health care provider before I take this medicine? They need to know if you have any of these conditions: -cirrhosis of the liver or liver disease -diarrhea -glaucoma -kidney stones or kidney disease -lung disease like asthma, obstructive pulmonary disease, emphysema -metabolic acidosis -on a ketogenic diet -scheduled for surgery or a procedure -suicidal thoughts, plans, or attempt; a previous suicide attempt by you or a family member -an unusual or allergic reaction to topiramate, other medicines, foods, dyes, or preservatives -pregnant or trying to get pregnant -breast-feeding How should I use this medicine? Take this medicine by mouth with a glass of water. Follow the directions on the prescription label. Trokendi XR capsules  must be swallowed whole. Do not sprinkle on food, break, crush, dissolve, or chew. Qudexy XR capsules may be swallowed whole or opened and sprinkled on a small amount of soft food. This mixture must be swallowed immediately. Do not chew or store mixture for later use. You may take this medicine with meals. Take your medicine at regular intervals. Do not take it more often than directed. Talk to your pediatrician regarding the use of this medicine in children. Special care may be needed. While Trokendi XR may be prescribed for children as young as 6 years and Qudexy XR may be prescribed for children as young as 2 years for selected conditions, precautions do apply. Overdosage: If you think you have taken too much of this medicine contact a poison control center or emergency room at once. NOTE: This medicine is only for you. Do not share this medicine with others. What if I miss a dose? If you miss a dose, take it as soon as you can. If it is almost time for your next dose, take only that dose. Do not take double or extra doses. What may interact with this medicine? Do not take this medicine with any of the following medications: -probenecid This medicine may also interact with the following medications: -acetazolamide -alcohol -amitriptyline -birth control pills -digoxin -hydrochlorothiazide -lithium -medicines for pain, sleep, or muscle relaxation -metformin -methazolamide -other seizure or epilepsy medicines -pioglitazone -risperidone This list may not describe all possible interactions. Give your health care provider a list of all the medicines, herbs, non-prescription drugs, or dietary supplements you use. Also tell  them if you smoke, drink alcohol, or use illegal drugs. Some items may interact with your medicine. What should I watch for while using this medicine? Visit your doctor or health care professional for regular checks on your progress. Do not stop taking this medicine suddenly.  This increases the risk of seizures if you are using this medicine to control epilepsy. Wear a medical identification bracelet or chain to say you have epilepsy or seizures, and carry a card that lists all your medicines. This medicine can decrease sweating and increase your body temperature. Watch for signs of deceased sweating or fever, especially in children. Avoid extreme heat, hot baths, and saunas. Be careful about exercising, especially in hot weather. Contact your health care provider right away if you notice a fever or decrease in sweating. You should drink plenty of fluids while taking this medicine. If you have had kidney stones in the past, this will help to reduce your chances of forming kidney stones. If you have stomach pain, with nausea or vomiting and yellowing of your eyes or skin, call your doctor immediately. You may get drowsy, dizzy, or have blurred vision. Do not drive, use machinery, or do anything that needs mental alertness until you know how this medicine affects you. To reduce dizziness, do not sit or stand up quickly, especially if you are an older patient. Alcohol can increase drowsiness and dizziness. Avoid alcoholic drinks. Do not drink alcohol for 6 hours before or 6 hours after taking Trokendi XR. If you notice blurred vision, eye pain, or other eye problems, seek medical attention at once for an eye exam. The use of this medicine may increase the chance of suicidal thoughts or actions. Pay special attention to how you are responding while on this medicine. Any worsening of mood, or thoughts of suicide or dying should be reported to your health care professional right away. This medicine may increase the chance of developing metabolic acidosis. If left untreated, this can cause kidney stones, bone disease, or slowed growth in children. Symptoms include breathing fast, fatigue, loss of appetite, irregular heartbeat, or loss of consciousness. Call your doctor immediately if you  experience any of these side effects. Also, tell your doctor about any surgery you plan on having while taking this medicine since this may increase your risk for metabolic acidosis. Birth control pills may not work properly while you are taking this medicine. Talk to your doctor about using an extra method of birth control. Women who become pregnant while using this medicine may enroll in the [place] Antiepileptic Drug Pregnancy Registry by calling [phone]. This registry collects information about the safety of antiepileptic drug use during pregnancy. What side effects may I notice from receiving this medicine? Side effects that you should report to your doctor or health care professional as soon as possible: -allergic reactions like skin rash, itching or hives, swelling of the face, lips, or tongue -decreased sweating and/or rise in body temperature -depression -difficulty breathing, fast or irregular breathing patterns -difficulty speaking -difficulty walking or controlling muscle movements -hearing impairment -redness, blistering, peeling or loosening of the skin, including inside the mouth -tingling, pain or numbness in the hands or feet -unusually weak or tired -worsening of mood, thoughts or actions of suicide or dying Side effects that usually do not require medical attention (report to your doctor or health care professional if they continue or are bothersome): -altered taste -back pain, joint or muscle aches and pains -diarrhea, or constipation -headache -loss of appetite -  nausea -stomach upset, indigestion -tremors This list may not describe all possible side effects. Call your doctor for medical advice about side effects. You may report side effects to FDA at 1-800-FDA-1088. Where should I keep my medicine? Keep out of the reach of children. Store at room temperature between 15 and 30 degrees C (59 and 86 degrees F) in a tightly closed container. Protect from  moisture. Throw away any unused medicine after the expiration date. NOTE: This sheet is a summary. It may not cover all possible information. If you have questions about this medicine, talk to your doctor, pharmacist, or health care provider.  2018 Elsevier/Gold Standard (2016-02-24 12:33:11)

## 2017-03-26 DIAGNOSIS — G43409 Hemiplegic migraine, not intractable, without status migrainosus: Secondary | ICD-10-CM | POA: Diagnosis not present

## 2017-03-26 DIAGNOSIS — G43709 Chronic migraine without aura, not intractable, without status migrainosus: Secondary | ICD-10-CM | POA: Diagnosis not present

## 2017-04-01 ENCOUNTER — Ambulatory Visit (INDEPENDENT_AMBULATORY_CARE_PROVIDER_SITE_OTHER): Payer: Medicare Other | Admitting: Osteopathic Medicine

## 2017-04-01 ENCOUNTER — Encounter: Payer: Self-pay | Admitting: Osteopathic Medicine

## 2017-04-01 VITALS — BP 108/72 | HR 84 | Ht 66.0 in | Wt 125.0 lb

## 2017-04-01 DIAGNOSIS — F339 Major depressive disorder, recurrent, unspecified: Secondary | ICD-10-CM

## 2017-04-01 DIAGNOSIS — F411 Generalized anxiety disorder: Secondary | ICD-10-CM

## 2017-04-01 DIAGNOSIS — L603 Nail dystrophy: Secondary | ICD-10-CM

## 2017-04-01 DIAGNOSIS — G43719 Chronic migraine without aura, intractable, without status migrainosus: Secondary | ICD-10-CM | POA: Diagnosis not present

## 2017-04-01 MED ORDER — CLONAZEPAM 0.5 MG PO TABS: 0.2500 mg | ORAL_TABLET | Freq: Two times a day (BID) | ORAL | 1 refills | Status: DC | PRN

## 2017-04-01 NOTE — Progress Notes (Signed)
HPI: Holly [name] is a 41 y.o. female  who presents to [facility] today, 04/01/17,  for chief complaint of: No chief complaint on file.  Anxiety: currently on Zoloft 50 mg daily. Has been on higher doses of this including 70 mg in 100 mg which didn't particularly help, otherwise this is a fairly effective medication for her as other trials of buspirone, bupropion, Lexapro have not worked in the past. Notes increased irritability and feeling like something terrible might have been, occasionally difficulty sleeping. Is noticing that she is much more anxious about what her kids are up to. She is experiencing some problems with her husband, admits to some communication difficulties. Previously in counseling and like to get back with her therapist  Migraines: Recent second opinion at [facility] from neurology. Patient has been overall fairly happy with [facility] but has just been desperate for any other information. Felt more comfortable with the neurologist at [facility], she states this person took a lot of time to explain things to her that was not overly helpful as far as management was concerned other than offering Botox, patient states that he was fairly clear about his role as a consultant/second opinion rather than her primary neurologist  Abnormal Nails: fingernails brittle curving a bit when they get too long. Takes PNV. Lots of washing - bathing kids, dishes   Past medical history, surgical history, social history and family history reviewed.  Patient Active Problem List   Diagnosis Date Noted  . Abnormal Pap smear of cervix 02/26/2017  . Depression, recurrent (HCC) 01/09/2017  . Chronic seasonal allergic rhinitis 11/27/2016  . Intractable chronic migraine without aura and with status migrainosus 04/08/2016  . NSVD (normal spontaneous vaginal delivery) 07/07/2015  . GERD 04/27/2010  . WOUND OPEN, KNEE/LEG/ANKLE W/O COMPLICATION 04/27/2010     Current medication list and allergy/intolerance information reviewed.   Current Outpatient Prescriptions on File Prior to Visit  Medication Sig Dispense Refill  . baclofen (LIORESAL) 10 MG tablet Take 10 mg by mouth at bedtime.     . butalbital-acetaminophen-caffeine (FIORICET, ESGIC) 50-325-40 MG tablet Take 1 tablet by mouth every 6 (six) hours as needed for headache. 15 tablet 5  . loratadine-pseudoephedrine (CLARITIN-D 24-HOUR) 10-240 MG 24 hr tablet Take 1 tablet by mouth daily. 30 tablet 11  . norethindrone (MICRONOR,CAMILA,ERRIN) 0.35 MG tablet Take 1 tablet (0.35 mg total) by mouth daily. 1 Package 11  . Prenatal Vit-Fe Fumarate-FA (PRENATAL MULTIVITAMIN) TABS tablet Take 1 tablet by mouth at bedtime.    . ranitidine (ZANTAC) 150 MG tablet Take 150 mg by mouth 2 (two) times daily.    . sertraline (ZOLOFT) 50 MG tablet Take 1 tablet (50 mg) by mouth daily  3  . Topiramate ER 200 MG CP24 Take 1 capsule by mouth at bedtime. 30 capsule 11   No current facility-administered medications on file prior to visit.    Allergies  Allergen Reactions  . Terbutaline Palpitations  . Sumatriptan   . Adhesive [Tape] Itching and Rash  . Ciprofloxacin Rash  . Zyrtec Allergy [Cetirizine Hcl] Other (See Comments)    Overly sleepy      Review of Systems:  Constitutional: No recent illness  HEENT: + chronic headache, no vision change  Cardiac: No  chest pain  Respiratory:  No  shortness of breath.  Musculoskeletal: No new myalgia/arthralgia  Skin: No  Rash, +nail changes as per HPI  Neurologic: No  weakness, No  Dizziness  Psychiatric: No    concerns with depression, +concerns with anxiety  Exam:  BP 108/72   Pulse 84   Ht 5' 6" (1.676 m)   Wt 125 lb (56.7 kg)   BMI 20.18 kg/m   Constitutional: VS see above. General Appearance: alert, well-developed, well-nourished, NAD  Skin: warm, dry, intact. Nails appear normal except for some mild curvature at the tips, nails are half a  centimeter long or so  Psychiatric: Normal judgment/insight. Normal mood and affect. Oriented x3.    Recent Results (from the past 2160 hour(s))  Cytology - PAP     Status: Abnormal   Collection Time: 01/09/17 12:00 AM  Result Value Ref Range   Adequacy      Satisfactory for evaluation  endocervical/transformation zone component PRESENT.   Diagnosis      NEGATIVE FOR INTRAEPITHELIAL LESIONS OR MALIGNANCY. BENIGN REACTIVE/REPARATIVE CHANGES.   HPV 16/18/45 genotyping NEGATIVE for HPV 16 & 18/45     Comment: Normal Reference Range - Negative   HPV DETECTED (A)     Comment: Normal Reference Range - NOT Detected   Material Submitted CervicoVaginal Pap [ThinPrep Imaged]   CBC with Differential/Platelet     Status: None   Collection Time: 02/12/17  3:10 PM  Result Value Ref Range   WBC 6.3 3.8 - 10.8 K/uL   RBC 4.18 3.80 - 5.10 MIL/uL   Hemoglobin 12.7 11.7 - 15.5 g/dL   HCT 38.6 35.0 - 45.0 %   MCV 92.3 80.0 - 100.0 fL   MCH 30.4 27.0 - 33.0 pg   MCHC 32.9 32.0 - 36.0 g/dL   RDW 13.7 11.0 - 15.0 %   Platelets 256 140 - 400 K/uL   MPV 8.8 7.5 - 12.5 fL   Neutro Abs 3,717 1,500 - 7,800 cells/uL   Lymphs Abs 2,079 850 - 3,900 cells/uL   Monocytes Absolute 315 200 - 950 cells/uL   Eosinophils Absolute 189 15 - 500 cells/uL   Basophils Absolute 0 0 - 200 cells/uL   Neutrophils Relative % 59 %   Lymphocytes Relative 33 %   Monocytes Relative 5 %   Eosinophils Relative 3 %   Basophils Relative 0 %   Smear Review Criteria for review not met   COMPLETE METABOLIC PANEL WITH GFR     Status: None   Collection Time: 02/12/17  3:10 PM  Result Value Ref Range   Sodium 138 135 - 146 mmol/L   Potassium 3.8 3.5 - 5.3 mmol/L   Chloride 107 98 - 110 mmol/L   CO2 26 20 - 31 mmol/L   Glucose, Bld 88 65 - 99 mg/dL   BUN 12 7 - 25 mg/dL   Creat 0.91 0.50 - 1.10 mg/dL   Total Bilirubin 0.5 0.2 - 1.2 mg/dL   Alkaline Phosphatase 56 33 - 115 U/L   AST 12 10 - 30 U/L   ALT 8 6 - 29 U/L   Total  Protein 7.0 6.1 - 8.1 g/dL   Albumin 4.2 3.6 - 5.1 g/dL   Calcium 8.6 8.6 - 10.2 mg/dL   GFR, Est African American >89 >=60 mL/min   GFR, Est Non African American 80 >=60 mL/min  Lipid panel     Status: Abnormal   Collection Time: 02/12/17  3:10 PM  Result Value Ref Range   Cholesterol 114 <200 mg/dL   Triglycerides 39 <150 mg/dL   HDL 49 (L) >50 mg/dL   Total CHOL/HDL Ratio 2.3 <5.0 Ratio   VLDL 8 <30 mg/dL   LDL  Cholesterol 57 <100 mg/dL  VITAMIN D 25 Hydroxy (Vit-D Deficiency, Fractures)     Status: None   Collection Time: 02/12/17  3:10 PM  Result Value Ref Range   Vit D, 25-Hydroxy 33 30 - 100 ng/mL    Comment: Vitamin D Status           25-OH Vitamin D        Deficiency                <20 ng/mL        Insufficiency         20 - 29 ng/mL        Optimal             > or = 30 ng/mL   For 25-OH Vitamin D testing on patients on D2-supplementation and patients for whom quantitation of D2 and D3 fractions is required, the QuestAssureD 25-OH VIT D, (D2,D3), LC/MS/MS is recommended: order code 85814 (patients > 2 yrs).   Thyroid Panel With TSH     Status: None   Collection Time: 02/12/17  3:10 PM  Result Value Ref Range   T4, Total 6.1 4.5 - 12.0 ug/dL   T3 Uptake 31 22 - 35 %   Free Thyroxine Index 1.9 1.4 - 3.8   TSH 1.14 mIU/L    Comment:   Reference Range   > or = 20 Years  0.40-4.50   Pregnancy Range First trimester  0.26-2.66 Second trimester 0.55-2.73 Third trimester  0.43-2.91       No results found.  No flowsheet data found.  No flowsheet data found.    ASSESSMENT/PLAN:   Generalized anxiety disorder - Advised can switch Zoloft or augment with Wellbutrin, or try prn benzo w/ caution to limit dose and reevaluate 4 weeks. Advise getting back into counseling - Plan: clonazePAM (KLONOPIN) 0.5 MG tablet  Depression, recurrent (HCC) - Less of an issue at this point than the anxiety, patient is aware that migraine/chronic illness is a factor here  Brittle  nails - Declines labs at this point, I don't see any strong pathological findings  Intractable chronic migraine without aura and without status migrainosus - Advise go with the practitioner she is most comfortable with. Patient has follow-up in place with both and will make decision. She has a lot of concerns about being a "problem patient" and doesn't want to offend anyone if she decides to switch neurology practices. She also has some concerns that the physician at [facility] may be close to retirement and she doesn't want to have to switch again if at all possible. There is also a referral in place for a subspecialist in [place] who may be able to offer some guidance with particular regard to hormone management       Follow-up plan: Return in about 4 weeks (around 04/29/2017) for recheck anxiety.  Visit summary with medication list and pertinent instructions was printed for patient to review, alert us if any changes needed. All questions at time of visit were answered - patient instructed to contact office with any additional concerns. ER/RTC precautions were reviewed with the patient and understanding verbalized.   Note: Total time spent 25 minutes, greater than 50% of the visit was spent face-to-face counseling and coordinating care for the following: The primary encounter diagnosis was Generalized anxiety disorder. Diagnoses of Depression, recurrent (HCC), Brittle nails, and Intractable chronic migraine without aura and without status migrainosus were also pertinent to this visit..  

## 2017-04-01 NOTE — Patient Instructions (Addendum)
"  Onychoschizia or splitting of the fingernails is a common problem seen by dermatologists. The term onychoschizia includes splitting, brittle, soft or thin nails. Onychoschizia is more common in women.  Only very rarely are internal disease or vitamin deficiencies the reason (iron deficiency is the most common). One tip is that if the fingernails split, but the toenails are strong, then an external factor is the cause. Basically brittle nails can be divided into dry and brittle (too little moisture) and soft and brittle (often too much moisture).  The usual cause is repeated wetting and drying of the fingernails. This makes them dry and brittle. This is often worse in low humidity and in the winter (dry heat). The best treatment is to apply lotions containing alpha-hydroxy acids or lanolin containing lotions such as "Elon" (by the "[employer]" company) to the nails after first soaking nails in water for 5 minutes.  Wearing gloves when performing household chores that involve getting the hands wet is very helpful in preventing brittle nails. Cotton lined rubber gloves can be purchased in stores.  If soft, consider that the nails may be getting too much moisture or being damaged by chemicals such as detergents, cleaning fluids and nail polish removers (the acetone containing removers are somewhat worse than acetone free). Some feel that once a week application of clear nail prep once a week may help. Nail polishes with nylon fibers in them may add strength.  Be gentle to you nails. Shape and file the nails with a very fine file and round the tips in a gentle curve. Daily filing of snags or irregularities helps to prevent further breakage or splitting. Avoid metal instruments on the nail surface to push back the cuticle. If the nails are "buffed" do this in the same direction as the nail grows and not in a "back and forth" motion because this can cause nail splitting.  Biotin (a vitamin) taken by mouth is  beneficial in some people. Get the "Biotin ultra" 1 mg. size as it also comes as much smaller pills and take 2 or 3 a day. It takes at least 6 months, but does really help at least 1/3 of the time. Do not take this if you are pregnant. Calcium, colloidal minerals, and/or gelatin my help, but have not been shown to help as reliably as Biotin.  The medical information provided in this site is for educational purposes only and is the property of the [employer]."

## 2017-04-08 ENCOUNTER — Telehealth: Payer: Self-pay | Admitting: Neurology

## 2017-04-08 MED ORDER — TROKENDI XR 200 MG PO CP24: 1.0000 | ORAL_CAPSULE | Freq: Every day | ORAL | 11 refills | Status: DC

## 2017-04-08 NOTE — Telephone Encounter (Signed)
Rx e-scribed to correct pharmacy as requested.

## 2017-04-08 NOTE — Telephone Encounter (Signed)
Holly [name] with [facility] called regarding TROKENDI XR 200 mg medication being filled with them. He would like an e-scribed rx sent over DAW no substitution. Please call and advise.

## 2017-04-29 ENCOUNTER — Encounter: Payer: Self-pay | Admitting: Osteopathic Medicine

## 2017-04-29 ENCOUNTER — Ambulatory Visit (INDEPENDENT_AMBULATORY_CARE_PROVIDER_SITE_OTHER): Payer: Medicare Other | Admitting: Osteopathic Medicine

## 2017-04-29 VITALS — BP 112/81 | HR 98 | Ht 67.0 in | Wt 123.0 lb

## 2017-04-29 DIAGNOSIS — F411 Generalized anxiety disorder: Secondary | ICD-10-CM | POA: Diagnosis not present

## 2017-04-29 MED ORDER — IBUPROFEN 800 MG PO TABS: 800.0000 mg | ORAL_TABLET | Freq: Three times a day (TID) | ORAL | 2 refills | Status: DC | PRN

## 2017-04-29 NOTE — Progress Notes (Signed)
HPI: Holly [name] is a 41 y.o. female  who presents to [name] today, 04/29/17,  for chief complaint of:  Chief Complaint  Patient presents with  . Follow-up    ANXIETY MEDICATION   Doing well on Zoloft 50 mg. Sparing use of Clonazepam has eased a lot of symptoms. Is still on first fill of the medicine (15 tabs) and has maybe 8 or so left.    Past medical history, surgical history, social history and family history reviewed.  Patient Active Problem List   Diagnosis Date Noted  . Abnormal Pap smear of cervix 02/26/2017  . Depression, recurrent (HCC) 01/09/2017  . Chronic seasonal allergic rhinitis 11/27/2016  . Intractable chronic migraine without aura and with status migrainosus 04/08/2016  . NSVD (normal spontaneous vaginal delivery) 07/07/2015  . GERD 04/27/2010  . WOUND OPEN, KNEE/LEG/ANKLE W/O COMPLICATION 04/27/2010    Current medication list and allergy/intolerance information reviewed.   Current Outpatient Prescriptions on File Prior to Visit  Medication Sig Dispense Refill  . baclofen (LIORESAL) 10 MG tablet Take 10 mg by mouth at bedtime.     . butalbital-acetaminophen-caffeine (FIORICET, ESGIC) 50-325-40 MG tablet Take 1 tablet by mouth every 6 (six) hours as needed for headache. 15 tablet 5  . clonazePAM (KLONOPIN) 0.5 MG tablet Take 0.5-1 tablets (0.25-0.5 mg total) by mouth 2 (two) times daily as needed for anxiety. Sparing use to prevent tolerance/dependence 15 tablet 1  . loratadine-pseudoephedrine (CLARITIN-D 24-HOUR) 10-240 MG 24 hr tablet Take 1 tablet by mouth daily. 30 tablet 11  . norethindrone (MICRONOR,CAMILA,ERRIN) 0.35 MG tablet Take 1 tablet (0.35 mg total) by mouth daily. 1 Package 11  . Prenatal Vit-Fe Fumarate-FA (PRENATAL MULTIVITAMIN) TABS tablet Take 1 tablet by mouth at bedtime.    . ranitidine (ZANTAC) 150 MG tablet Take 150 mg by mouth 2 (two) times daily.    . sertraline (ZOLOFT) 50 MG tablet Take 1  tablet (50 mg) by mouth daily  3  . TROKENDI XR 200 MG CP24 Take 1 capsule by mouth at bedtime. 30 capsule 11   No current facility-administered medications on file prior to visit.    Allergies  Allergen Reactions  . Terbutaline Palpitations  . Sumatriptan   . Adhesive [Tape] Itching and Rash  . Ciprofloxacin Rash  . Zyrtec Allergy [Cetirizine Hcl] Other (See Comments)    Overly sleepy      Review of Systems:  Constitutional: No recent illness  HEENT: + chronic headache, no vision change  Cardiac: No  chest pain  Respiratory:  No  shortness of breath.  Musculoskeletal: +new myalgia/arthralgia - want to know what can take for aches/pains, Ibuprofen 800s have worked well in the past  Skin: No  Rash  Neurologic: No  weakness, No  Dizziness  Psychiatric: No  concerns with depression, +concerns with anxiety but improved  Exam:  BP 112/81   Pulse 98   Ht 5\' 7"  (1.702 m)   Wt 123 lb (55.8 kg)   BMI 19.26 kg/m   Constitutional: VS see above. General Appearance: alert, well-developed, well-nourished, NAD  Psychiatric: Normal judgment/insight. Normal mood and affect. Oriented x3.       ASSESSMENT/PLAN:     Generalized anxiety disorder - Doing well, can try doubling Zoloft if desired but stable and appropriate use of prn benzos ok to continue until follow-up in 6 mos    Follow-up plan: Return in about 6 months (around 10/29/2017) for recheck mood / anxiety - sooner if  needed.  Visit summary with medication list and pertinent instructions was printed for patient to review, alert us if any changes needed. All questions at time of visit were answered - patient instructed to contact office with any additional concerns. ER/RTC precautions were reviewed with the patient and understanding verbalized.

## 2017-05-12 DIAGNOSIS — G43119 Migraine with aura, intractable, without status migrainosus: Secondary | ICD-10-CM | POA: Diagnosis not present

## 2017-05-27 ENCOUNTER — Ambulatory Visit (INDEPENDENT_AMBULATORY_CARE_PROVIDER_SITE_OTHER): Payer: Medicare Other

## 2017-05-27 ENCOUNTER — Encounter: Payer: Self-pay | Admitting: Osteopathic Medicine

## 2017-05-27 ENCOUNTER — Ambulatory Visit (INDEPENDENT_AMBULATORY_CARE_PROVIDER_SITE_OTHER): Payer: Medicare Other | Admitting: Osteopathic Medicine

## 2017-05-27 VITALS — BP 119/67 | HR 106 | Wt 124.0 lb

## 2017-05-27 DIAGNOSIS — M25562 Pain in left knee: Secondary | ICD-10-CM | POA: Diagnosis not present

## 2017-05-27 DIAGNOSIS — S8992XA Unspecified injury of left lower leg, initial encounter: Secondary | ICD-10-CM | POA: Diagnosis not present

## 2017-05-27 NOTE — Patient Instructions (Addendum)
Initial management of suspected meniscus injury-In the absence of instability, the initial management of a meniscal tear includes the following:  ?Rest the knee. Avoid positions and activities that place excessive pressure on the knee joint until pain and swelling resolve. Such activities include: squatting, kneeling, twisting and pivoting, repetitive bending (eg, stairs, getting out of a seated position, clutch and pedal pushing), jogging, dancing, and swimming using the frog or whip kick.  ?Apply ice to the knee for 15 minutes every four to six hours, while keeping the leg elevated.  ?Can use crutches if the pain is severe.  ?A patellar restraining brace may be helpful if quadriceps strength is poor and the knee frequently "gives out."  Begin straight leg raising exercises without weights as the pain begins to wane.   Begin with sets of 10 leg lifts and gradually work up to 20 to 25 lifts, each held for five seconds.   With improvement, light weights can be added to the ankle, beginning with a 2 pound weight and gradually increasing the weight to 5 to 10 pounds.   In lieu of exercise weights, a heavy shoe or a bag containing one or more books may be used.  Exercise on equipment that requires deep knee bends against resistance, such as the stair stepper and rowing machine, should be avoided until pain and swelling resolve.  Suitable exercises may include walking, swimming using a limited freestyle kick, water aerobics, walking or light jogging on a soft platform treadmill, and using a cross-country ski glide machine.

## 2017-05-27 NOTE — Progress Notes (Signed)
HPI: Holly [name] is a 41 y.o. female  who presents to [name] today, 05/27/17,  for chief complaint of:  Chief Complaint  Patient presents with  . Knee Pain     . 3 days ago was sitting with leg propped up and child fell on her knee and pushed knee toward floor, now feeling catching sensation and pain in knee w/ walking, radiation up into quadriceps and into calf. No swelling. Has been icing and using ibuprofen.      Past medical history, surgical history, social history and family history reviewed.  Patient Active Problem List   Diagnosis Date Noted  . Abnormal Pap smear of cervix 02/26/2017  . Depression, recurrent (HCC) 01/09/2017  . Chronic seasonal allergic rhinitis 11/27/2016  . Intractable chronic migraine without aura and with status migrainosus 04/08/2016  . NSVD (normal spontaneous vaginal delivery) 07/07/2015  . GERD 04/27/2010  . WOUND OPEN, KNEE/LEG/ANKLE W/O COMPLICATION 04/27/2010    Current medication list and allergy/intolerance information reviewed.   Current Outpatient Prescriptions on File Prior to Visit  Medication Sig Dispense Refill  . baclofen (LIORESAL) 10 MG tablet Take 10 mg by mouth at bedtime.     . butalbital-acetaminophen-caffeine (FIORICET, ESGIC) 50-325-40 MG tablet Take 1 tablet by mouth every 6 (six) hours as needed for headache. 15 tablet 5  . clonazePAM (KLONOPIN) 0.5 MG tablet Take 0.5-1 tablets (0.25-0.5 mg total) by mouth 2 (two) times daily as needed for anxiety. Sparing use to prevent tolerance/dependence 15 tablet 1  . ibuprofen (ADVIL,MOTRIN) 800 MG tablet Take 1 tablet (800 mg total) by mouth every 8 (eight) hours as needed for mild pain or moderate pain. 60 tablet 2  . loratadine-pseudoephedrine (CLARITIN-D 24-HOUR) 10-240 MG 24 hr tablet Take 1 tablet by mouth daily. 30 tablet 11  . norethindrone (MICRONOR,CAMILA,ERRIN) 0.35 MG tablet Take 1 tablet (0.35 mg total) by mouth daily. 1 Package  11  . Prenatal Vit-Fe Fumarate-FA (PRENATAL MULTIVITAMIN) TABS tablet Take 1 tablet by mouth at bedtime.    . ranitidine (ZANTAC) 150 MG tablet Take 150 mg by mouth 2 (two) times daily.    . sertraline (ZOLOFT) 50 MG tablet Take 1 tablet (50 mg) by mouth daily  3  . TROKENDI XR 200 MG CP24 Take 1 capsule by mouth at bedtime. 30 capsule 11   No current facility-administered medications on file prior to visit.    Allergies  Allergen Reactions  . Terbutaline Palpitations  . Sumatriptan   . Adhesive [Tape] Itching and Rash  . Ciprofloxacin Rash  . Zyrtec Allergy [Cetirizine Hcl] Other (See Comments)    Overly sleepy      Review of Systems:  Constitutional: No recent illness  Musculoskeletal: +new myalgia/arthralgia  Skin: No  Rash  Hem/Onc: No  easy bruising/bleeding, No  abnormal lumps/bumps  Neurologic: No  weakness, No  Dizziness   Exam:  BP 119/67   Pulse (!) 106   Wt 124 lb (56.2 kg)   BMI 19.42 kg/m   Constitutional: VS see above. General Appearance: alert, well-developed, well-nourished, NAD  Eyes: Normal lids and conjunctive, non-icteric sclera  Ears, Nose, Mouth, Throat: MMM, Normal external inspection ears/nares/mouth/lips/gums.  Neck: No masses, trachea midline.   Respiratory: Normal respiratory effort. no wheeze, no rhonchi, no rales  Cardiovascular: S1/S2 normal, no murmur, no rub/gallop auscultated. RRR.   Musculoskeletal: Gait favoring L leg. Symmetric and independent movement of all extremities. No effusion/eccymoses. Normal patellar glide on L. (+)Apley compression on L exterior  rotation, negative distraction test, negative ant/post drawer test  Neurological: Normal balance/coordination. No tremor.  Skin: warm, dry, intact.   Psychiatric: Normal judgment/insight. Normal mood and affect. Oriented x3.    Dg Knee Complete 4 Views Left  Result Date: 05/27/2017 CLINICAL DATA:  41yo fell on her knee Friday. Pt felt a pop and has been having lt knee  pain, grinding. EXAM: LEFT KNEE - COMPLETE 4+ VIEW COMPARISON:  None. FINDINGS: No evidence of fracture, dislocation, or joint effusion. No evidence of arthropathy or other focal bone abnormality. Soft tissues are unremarkable. IMPRESSION: Negative. Electronically Signed   By: Norva PavlovElizabeth  Brown M.D.   On: 05/27/2017 16:42      ASSESSMENT/PLAN:   Acute pain of left knee - Suspect meniscal tear - conservative treamtent for now and consider sport med f/u /MRI if no better or if worse - Plan: DG Knee Complete 4 Views Left    Patient Instructions  Initial management of suspected meniscus injury-In the absence of instability, the initial management of a meniscal tear includes the following:  ?Rest the knee. Avoid positions and activities that place excessive pressure on the knee joint until pain and swelling resolve. Such activities include: squatting, kneeling, twisting and pivoting, repetitive bending (eg, stairs, getting out of a seated position, clutch and pedal pushing), jogging, dancing, and swimming using the frog or whip kick.  ?Apply ice to the knee for 15 minutes every four to six hours, while keeping the leg elevated.  ?Can use crutches if the pain is severe.  ?A patellar restraining brace may be helpful if quadriceps strength is poor and the knee frequently "gives out."  Begin straight leg raising exercises without weights as the pain begins to wane.   Begin with sets of 10 leg lifts and gradually work up to 20 to 25 lifts, each held for five seconds.   With improvement, light weights can be added to the ankle, beginning with a 2 pound weight and gradually increasing the weight to 5 to 10 pounds.   In lieu of exercise weights, a heavy shoe or a bag containing one or more books may be used.  Exercise on equipment that requires deep knee bends against resistance, such as the stair stepper and rowing machine, should be avoided until pain and swelling resolve.  Suitable exercises  may include walking, swimming using a limited freestyle kick, water aerobics, walking or light jogging on a soft platform treadmill, and using a cross-country ski glide machine.     Follow-up plan: Return in about 2 weeks (around 06/10/2017) for recheck knee pain with Dr. Karie Schwalbe if no better .  Visit summary with medication list and pertinent instructions was printed for patient to review, alert us if any changes needed. All questions at time of visit were answered - patient instructed to contact office with any additional concerns. ER/RTC precautions were reviewed with the patient and understanding verbalized.

## 2017-06-19 ENCOUNTER — Other Ambulatory Visit: Payer: Self-pay | Admitting: Osteopathic Medicine

## 2017-06-19 NOTE — Telephone Encounter (Signed)
Zoloft is a historical med and singulair is not on active med list. Have to rout to provider

## 2017-07-05 ENCOUNTER — Encounter: Payer: Self-pay | Admitting: Physician Assistant

## 2017-07-05 ENCOUNTER — Ambulatory Visit (INDEPENDENT_AMBULATORY_CARE_PROVIDER_SITE_OTHER): Payer: Medicare Other | Admitting: Physician Assistant

## 2017-07-05 VITALS — BP 127/85 | HR 87 | Temp 98.1°F | Wt 128.0 lb

## 2017-07-05 DIAGNOSIS — H65191 Other acute nonsuppurative otitis media, right ear: Secondary | ICD-10-CM | POA: Diagnosis not present

## 2017-07-05 DIAGNOSIS — J0100 Acute maxillary sinusitis, unspecified: Secondary | ICD-10-CM | POA: Diagnosis not present

## 2017-07-05 DIAGNOSIS — Z20818 Contact with and (suspected) exposure to other bacterial communicable diseases: Secondary | ICD-10-CM | POA: Diagnosis not present

## 2017-07-05 LAB — POCT RAPID STREP A (OFFICE): Rapid Strep A Screen: NEGATIVE

## 2017-07-05 MED ORDER — PREDNISONE 50 MG PO TABS: ORAL_TABLET | ORAL | 0 refills | Status: DC

## 2017-07-05 MED ORDER — CEFUROXIME AXETIL 250 MG PO TABS: 250.0000 mg | ORAL_TABLET | Freq: Two times a day (BID) | ORAL | 0 refills | Status: AC

## 2017-07-05 NOTE — Patient Instructions (Addendum)
- Antibiotic twice a day for 7 days - Prednisone once a day for 5 days - Netty pot / saline washes / saline nasal spray    Sinusitis, Adult Sinusitis is soreness and inflammation of your sinuses. Sinuses are hollow spaces in the bones around your face. Your sinuses are located:  Around your eyes.  In the middle of your forehead.  Behind your nose.  In your cheekbones.  Your sinuses and nasal passages are lined with a stringy fluid (mucus). Mucus normally drains out of your sinuses. When your nasal tissues become inflamed or swollen, the mucus can become trapped or blocked so air cannot flow through your sinuses. This allows bacteria, viruses, and funguses to grow, which leads to infection. Sinusitis can develop quickly and last for 7?10 days (acute) or for more than 12 weeks (chronic). Sinusitis often develops after a cold. What are the causes? This condition is caused by anything that creates swelling in the sinuses or stops mucus from draining, including:  Allergies.  Asthma.  Bacterial or viral infection.  Abnormally shaped bones between the nasal passages.  Nasal growths that contain mucus (nasal polyps).  Narrow sinus openings.  Pollutants, such as chemicals or irritants in the air.  A foreign object stuck in the nose.  A fungal infection. This is rare.  What increases the risk? The following factors may make you more likely to develop this condition:  Having allergies or asthma.  Having had a recent cold or respiratory tract infection.  Having structural deformities or blockages in your nose or sinuses.  Having a weak immune system.  Doing a lot of swimming or diving.  Overusing nasal sprays.  Smoking.  What are the signs or symptoms? The main symptoms of this condition are pain and a feeling of pressure around the affected sinuses. Other symptoms include:  Upper toothache.  Earache.  Headache.  Bad breath.  Decreased sense of smell and  taste.  A cough that may get worse at night.  Fatigue.  Fever.  Thick drainage from your nose. The drainage is often green and it may contain pus (purulent).  Stuffy nose or congestion.  Postnasal drip. This is when extra mucus collects in the throat or back of the nose.  Swelling and warmth over the affected sinuses.  Sore throat.  Sensitivity to light.  How is this diagnosed? This condition is diagnosed based on symptoms, a medical history, and a physical exam. To find out if your condition is acute or chronic, your health care provider may:  Look in your nose for signs of nasal polyps.  Tap over the affected sinus to check for signs of infection.  View the inside of your sinuses using an imaging device that has a light attached (endoscope).  If your health care provider suspects that you have chronic sinusitis, you may also:  Be tested for allergies.  Have a sample of mucus taken from your nose (nasal culture) and checked for bacteria.  Have a mucus sample examined to see if your sinusitis is related to an allergy.  If your sinusitis does not respond to treatment and it lasts longer than 8 weeks, you may have an MRI or CT scan to check your sinuses. These scans also help to determine how severe your infection is. In rare cases, a bone biopsy may be done to rule out more serious types of fungal sinus disease. How is this treated? Treatment for sinusitis depends on the cause and whether your condition is chronic  or acute. If a virus is causing your sinusitis, your symptoms will go away on their own within 10 days. You may be given medicines to relieve your symptoms, including:  Topical nasal decongestants. They shrink swollen nasal passages and let mucus drain from your sinuses.  Antihistamines. These drugs block inflammation that is triggered by allergies. This can help to ease swelling in your nose and sinuses.  Topical nasal corticosteroids. These are nasal sprays  that ease inflammation and swelling in your nose and sinuses.  Nasal saline washes. These rinses can help to get rid of thick mucus in your nose.  If your condition is caused by bacteria, you will be given an antibiotic medicine. If your condition is caused by a fungus, you will be given an antifungal medicine. Surgery may be needed to correct underlying conditions, such as narrow nasal passages. Surgery may also be needed to remove polyps. Follow these instructions at home: Medicines  Take, use, or apply over-the-counter and prescription medicines only as told by your health care provider. These may include nasal sprays.  If you were prescribed an antibiotic medicine, take it as told by your health care provider. Do not stop taking the antibiotic even if you start to feel better. Hydrate and Humidify  Drink enough water to keep your urine clear or pale yellow. Staying hydrated will help to thin your mucus.  Use a cool mist humidifier to keep the humidity level in your home above 50%.  Inhale steam for 10-15 minutes, 3-4 times a day or as told by your health care provider. You can do this in the bathroom while a hot shower is running.  Limit your exposure to cool or dry air. Rest  Rest as much as possible.  Sleep with your head raised (elevated).  Make sure to get enough sleep each night. General instructions  Apply a warm, moist washcloth to your face 3-4 times a day or as told by your health care provider. This will help with discomfort.  Wash your hands often with soap and water to reduce your exposure to viruses and other germs. If soap and water are not available, use hand sanitizer.  Do not smoke. Avoid being around people who are smoking (secondhand smoke).  Keep all follow-up visits as told by your health care provider. This is important. Contact a health care provider if:  You have a fever.  Your symptoms get worse.  Your symptoms do not improve within 10  days. Get help right away if:  You have a severe headache.  You have persistent vomiting.  You have pain or swelling around your face or eyes.  You have vision problems.  You develop confusion.  Your neck is stiff.  You have trouble breathing. This information is not intended to replace advice given to you by your health care provider. Make sure you discuss any questions you have with your health care provider. Document Released: 11/05/2005 Document Revised: 07/01/2016 Document Reviewed: 08/31/2015 Elsevier Interactive Patient Education  2017 [employer].

## 2017-07-05 NOTE — Progress Notes (Signed)
HPI:                                                                Holly [name] is a 41 y.o. female who presents to [name]: [facility] today for sinus congestion  Sinusitis  This is a new problem. The current episode started 1 to 4 weeks ago. The problem is unchanged. There has been no fever. The pain is mild. Associated symptoms include congestion, ear pain (right), headaches and sinus pressure. Pertinent negatives include no chills, coughing or neck pain. (+ PND) Past treatments include oral decongestants and acetaminophen (migraine abortive therapy). The treatment provided no relief.  Sick contacts include multiple children at home; 1 has been diagnosed with Strep.    Past Medical History:  Diagnosis Date  . Depression   . GERD (gastroesophageal reflux disease)   . IBS (irritable bowel syndrome)   . Migraine   . MRSA (methicillin resistant staph aureus) culture positive    Past Surgical History:  Procedure Laterality Date  . WRIST SURGERY     cyst removal   Social History  Substance Use Topics  . Smoking status: Never Smoker  . Smokeless tobacco: Never Used  . Alcohol use No     Comment: none   family history includes Autoimmune disease in her father; Cancer in her sister; Crohn's disease in her father; Depression in her mother and sister; Diabetes in her father, paternal grandfather, and sister; Factor V Leiden deficiency in her father; Heart attack in her father and mother; Heart failure in her father; Hyperlipidemia in her father and mother; Hypertension in her mother; Migraines in her mother; Scoliosis in her mother; Thyroid disease in her mother.  ROS: negative except as noted in the HPI  Medications: Current Outpatient Prescriptions  Medication Sig Dispense Refill  . baclofen (LIORESAL) 10 MG tablet Take 10 mg by mouth at bedtime.     . butalbital-acetaminophen-caffeine (FIORICET, ESGIC) 50-325-40 MG tablet Take 1 tablet  by mouth every 6 (six) hours as needed for headache. 15 tablet 5  . clonazePAM (KLONOPIN) 0.5 MG tablet Take 0.5-1 tablets (0.25-0.5 mg total) by mouth 2 (two) times daily as needed for anxiety. Sparing use to prevent tolerance/dependence 15 tablet 1  . ibuprofen (ADVIL,MOTRIN) 800 MG tablet Take 1 tablet (800 mg total) by mouth every 8 (eight) hours as needed for mild pain or moderate pain. 60 tablet 2  . loratadine-pseudoephedrine (CLARITIN-D 24-HOUR) 10-240 MG 24 hr tablet Take 1 tablet by mouth daily. 30 tablet 11  . montelukast (SINGULAIR) 10 MG tablet Take 1 tablet (10 mg) by mouth daily in the evening 90 tablet 3  . norethindrone (MICRONOR,CAMILA,ERRIN) 0.35 MG tablet Take 1 tablet (0.35 mg total) by mouth daily. 1 Package 11  . Prenatal Vit-Fe Fumarate-FA (PRENATAL MULTIVITAMIN) TABS tablet Take 1 tablet by mouth at bedtime.    . ranitidine (ZANTAC) 150 MG tablet Take 150 mg by mouth 2 (two) times daily.    . sertraline (ZOLOFT) 50 MG tablet Take 1 tablet (50 mg) by mouth daily 90 tablet 3  . TROKENDI XR 200 MG CP24 Take 1 capsule by mouth at bedtime. 30 capsule 11  . cefUROXime (CEFTIN) 250 MG tablet Take 1 tablet (250 mg total) by  mouth 2 (two) times daily with a meal. 14 tablet 0  . predniSONE (DELTASONE) 50 MG tablet One tab PO daily for 5 days. 5 tablet 0   No current facility-administered medications for this visit.    Allergies  Allergen Reactions  . Terbutaline Palpitations  . Sumatriptan   . Adhesive [Tape] Itching and Rash  . Ciprofloxacin Rash  . Zyrtec Allergy [Cetirizine Hcl] Other (See Comments)    Overly sleepy       Objective:  BP 127/85   Pulse 87   Temp 98.1 F (36.7 C) (Oral)   Wt 128 lb (58.1 kg)   SpO2 100%   BMI 20.05 kg/m  Gen: well-groomed, cooperative, not ill-appearing, no distress HEENT: normal conjunctiva, right TM slightly retracted, left TM clear, nasal mucosa edematous, oropharynx clear, moist mucus membranes, there is right maxillary sinus  tenderness, neck supple, trachea midline Pulm: Normal work of breathing, normal phonation, clear to auscultation bilaterally CV: Normal rate, regular rhythm, s1 and s2 distinct, no murmurs, clicks or rubs  Neuro: alert and oriented x 3, no tremor MSK: extremities atraumatic, normal gait and station Skin: warm, dry, intact; no rashes on exposed skin, no cyanosis  Results for orders placed or performed in visit on 07/05/17 (from the past 72 hour(s))  POCT rapid strep A     Status: Abnormal   Collection Time: 07/05/17  4:18 PM  Result Value Ref Range   Rapid Strep A Screen Negative Negative   No results found.    Assessment and Plan: 41 y.o. female with   1. Acute non-recurrent maxillary sinusitis - symptoms present for nearly 4 weeks and worsening. Antibiotics and steroids - cefUROXime (CEFTIN) 250 MG tablet; Take 1 tablet (250 mg total) by mouth 2 (two) times daily with a meal.  Dispense: 14 tablet; Refill: 0 - predniSONE (DELTASONE) 50 MG tablet; One tab PO daily for 5 days.  Dispense: 5 tablet; Refill: 0  2. Acute effusion of right ear - cefUROXime (CEFTIN) 250 MG tablet; Take 1 tablet (250 mg total) by mouth 2 (two) times daily with a meal.  Dispense: 14 tablet; Refill: 0 - predniSONE (DELTASONE) 50 MG tablet; One tab PO daily for 5 days.  Dispense: 5 tablet; Refill: 0  3. Strep throat exposure - POCT rapid strep A negative  Patient education and anticipatory guidance given Patient agrees with treatment plan Follow-up as needed if symptoms worsen or fail to improve  Levonne Hubert PA-C

## 2017-07-16 ENCOUNTER — Ambulatory Visit (INDEPENDENT_AMBULATORY_CARE_PROVIDER_SITE_OTHER): Payer: Medicare Other | Admitting: Osteopathic Medicine

## 2017-07-16 ENCOUNTER — Encounter: Payer: Self-pay | Admitting: Osteopathic Medicine

## 2017-07-16 VITALS — BP 119/79 | HR 93 | Wt 127.0 lb

## 2017-07-16 DIAGNOSIS — N898 Other specified noninflammatory disorders of vagina: Secondary | ICD-10-CM

## 2017-07-16 DIAGNOSIS — J0100 Acute maxillary sinusitis, unspecified: Secondary | ICD-10-CM | POA: Diagnosis not present

## 2017-07-16 DIAGNOSIS — G43719 Chronic migraine without aura, intractable, without status migrainosus: Secondary | ICD-10-CM | POA: Diagnosis not present

## 2017-07-16 MED ORDER — DEXAMETHASONE SODIUM PHOSPHATE 4 MG/ML IJ SOLN
4.0000 mg | Freq: Once | INTRAMUSCULAR | Status: AC
  Administered 2017-07-16: 4 mg via INTRAMUSCULAR

## 2017-07-16 MED ORDER — KETOROLAC TROMETHAMINE 60 MG/2ML IM SOLN
60.0000 mg | Freq: Once | INTRAMUSCULAR | Status: AC
  Administered 2017-07-16: 60 mg via INTRAMUSCULAR

## 2017-07-16 MED ORDER — DOXYCYCLINE HYCLATE 100 MG PO TABS: 100.0000 mg | ORAL_TABLET | Freq: Two times a day (BID) | ORAL | 0 refills | Status: DC

## 2017-07-16 NOTE — Patient Instructions (Signed)
Plan:  Warm compresses 10-15 minutes 3-4 times per day for the lump  If migraine persists, come see [place] or urgent care / ER - we gave you steroids and painkillers today, steroids should prevent headache recurrence  New antibiotics which should help sinuses and abscess/cyst. If symptoms persist, may need to get CT of the sinuses

## 2017-07-16 NOTE — Progress Notes (Signed)
HPI: Holly [name] is a 41 y.o. female  who presents to [facility] today, 07/16/17,  for chief complaint of: No chief complaint on file.   Lump in groin - gets these on occasion, this one is looking a lot better and she doesn't want to do much else about it  Migraine: chronic migraines, severe. Following with neurology but has one today. Has already taken Fioricet which has brought pain from 11 to 8-9, took phenergan 1 hours ago.   Sinus: recently seen by colleague for sinusitis, given ceph and steroid, reports L ear and sinus pain/pressure, was having some blood from outside skin of L ear but pain is better    Past medical history, surgical history, social history and family history reviewed.  Patient Active Problem List   Diagnosis Date Noted  . Acute pain of left knee 05/27/2017  . 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 Abnormal Pap smear of cervix 02/26/2017  . Depression, recurrent (HCC) 01/09/2017  . Chronic seasonal allergic rhinitis 11/27/2016  . Intractable chronic migraine without aura and with status migrainosus 04/08/2016    Current medication list and allergy/intolerance information reviewed.   Current Outpatient Prescriptions on File Prior to Visit  Medication Sig Dispense Refill  . baclofen (LIORESAL) 10 MG tablet Take 10 mg by mouth at bedtime.     . butalbital-acetaminophen-caffeine (FIORICET, ESGIC) 50-325-40 MG tablet Take 1 tablet by mouth every 6 (six) hours as needed for headache. 15 tablet 5  . clonazePAM (KLONOPIN) 0.5 MG tablet Take 0.5-1 tablets (0.25-0.5 mg total) by mouth 2 (two) times daily as needed for anxiety. Sparing use to prevent tolerance/dependence 15 tablet 1  . ibuprofen (ADVIL,MOTRIN) 800 MG tablet Take 1 tablet (800 mg total) by mouth every 8 (eight) hours as needed for mild pain or moderate pain. 60 tablet 2  . loratadine-pseudoephedrine (CLARITIN-D 24-HOUR) 10-240 MG 24 hr tablet Take 1 tablet by mouth  daily. 30 tablet 11  . montelukast (SINGULAIR) 10 MG tablet Take 1 tablet (10 mg) by mouth daily in the evening 90 tablet 3  . norethindrone (MICRONOR,CAMILA,ERRIN) 0.35 MG tablet Take 1 tablet (0.35 mg total) by mouth daily. 1 Package 11  . predniSONE (DELTASONE) 50 MG tablet One tab PO daily for 5 days. 5 tablet 0  . Prenatal Vit-Fe Fumarate-FA (PRENATAL MULTIVITAMIN) TABS tablet Take 1 tablet by mouth at bedtime.    . ranitidine (ZANTAC) 150 MG tablet Take 150 mg by mouth 2 (two) times daily.    . sertraline (ZOLOFT) 50 MG tablet Take 1 tablet (50 mg) by mouth daily 90 tablet 3  . TROKENDI XR 200 MG CP24 Take 1 capsule by mouth at bedtime. 30 capsule 11   No current facility-administered medications on file prior to visit.    Allergies  Allergen Reactions  . Terbutaline Palpitations  . Sumatriptan   . Adhesive [Tape] Itching and Rash  . Ciprofloxacin Rash  . Zyrtec Allergy [Cetirizine Hcl] Other (See Comments)    Overly sleepy      Review of Systems:  Constitutional: +recent sinus illness  HEENT: +headache, no vision change, +sinus symptoms and ear complaints as per HPI   Cardiac: No  chest pain, No  pressure, No palpitations  Respiratory:  No  shortness of breath. No  Cough  Musculoskeletal: No new myalgia/arthralgia  Skin: No  Rash, +abscess/cyst as per HPI  Neurologic: No  weakness, +Dizziness w/ migraine   Psychiatric: No  concerns with depression, No  concerns with  anxiety  Exam:  BP 119/79   Pulse 93   Wt 127 lb (57.6 kg)   BMI 19.89 kg/m   Constitutional: VS see above. General Appearance: alert, well-developed, well-nourished, NAD  Eyes: Normal lids and conjunctive, non-icteric sclera  Ears, Nose, Mouth, Throat: MMM, Normal external inspection ears/nares/mouth/lips/gums. TM normal b/l, ear canals normal b/l  Neck: No masses, trachea midline.   Respiratory: Normal respiratory effort. no wheeze, no rhonchi, no rales  Cardiovascular: S1/S2 normal, no  murmur, no rub/gallop auscultated. RRR.   Musculoskeletal: Gait normal. Symmetric and independent movement of all extremities  Neurological: Normal balance/coordination. No tremor.  Psychiatric: Normal judgment/insight. Normal mood and affect. Oriented x3.       ASSESSMENT/PLAN:   Intractable chronic migraine without aura and without status migrainosus - Gave Toradol and Decadron, no reglan since just had phenergan. F/u w/ neurology, or if pain persists ER for pain mgt - Plan: ketorolac (TORADOL) injection 60 mg, dexamethasone (DECADRON) injection 4 mg  Acute non-recurrent maxillary sinusitis - Escalate abx for resistant illness, consider CT sinus   Cyst, vagina - declines intervwention today, warm compresses advised, Doxy should also cover other concerning skin pathogens, improvement is reassuring     Patient Instructions  Plan:  Warm compresses 10-15 minutes 3-4 times per day for the lump  If migraine persists, come see [place] or urgent care / ER - we gave you steroids and painkillers today, steroids should prevent headache recurrence  New antibiotics which should help sinuses and abscess/cyst. If symptoms persist, may need to get CT of the sinuses     Follow-up plan: Return if symptoms worsen or fail to improve.  Visit summary with medication list and pertinent instructions was printed for patient to review, alert [place] if any changes needed. All questions at time of visit were answered - patient instructed to contact office with any additional concerns. ER/RTC precautions were reviewed with the patient and understanding verbalized.   Note: Total time spent 25 minutes, greater than 50% of the visit was spent face-to-face counseling and coordinating care for the following: The primary encounter diagnosis was Intractable chronic migraine without aura and without status migrainosus. Diagnoses of Acute non-recurrent maxillary sinusitis and Cyst, vagina were also pertinent to this  visit.Marland Kitchen

## 2017-07-31 ENCOUNTER — Other Ambulatory Visit: Payer: Self-pay | Admitting: Osteopathic Medicine

## 2017-08-07 ENCOUNTER — Encounter: Payer: Self-pay | Admitting: Osteopathic Medicine

## 2017-08-07 ENCOUNTER — Ambulatory Visit (INDEPENDENT_AMBULATORY_CARE_PROVIDER_SITE_OTHER): Payer: Medicare Other | Admitting: Osteopathic Medicine

## 2017-08-07 VITALS — BP 112/77 | HR 87 | Ht 66.0 in | Wt 126.0 lb

## 2017-08-07 DIAGNOSIS — N644 Mastodynia: Secondary | ICD-10-CM | POA: Diagnosis not present

## 2017-08-07 DIAGNOSIS — M791 Myalgia, unspecified site: Secondary | ICD-10-CM

## 2017-08-07 DIAGNOSIS — N946 Dysmenorrhea, unspecified: Secondary | ICD-10-CM | POA: Diagnosis not present

## 2017-08-07 DIAGNOSIS — M940 Chondrocostal junction syndrome [Tietze]: Secondary | ICD-10-CM

## 2017-08-07 DIAGNOSIS — R002 Palpitations: Secondary | ICD-10-CM

## 2017-08-07 MED ORDER — KETOROLAC TROMETHAMINE 60 MG/2ML IM SOLN
60.0000 mg | Freq: Once | INTRAMUSCULAR | Status: AC
  Administered 2017-08-07: 60 mg via INTRAMUSCULAR

## 2017-08-07 NOTE — Addendum Note (Signed)
Addended by: Pixie Casino on: 08/07/2017 10:22 AM   Modules accepted: Orders

## 2017-08-07 NOTE — Progress Notes (Signed)
HPI: Holly [name] is a 41 y.o. female  who presents to [name] today, 08/07/17,  for chief complaint of:  Chief Complaint  Patient presents with  . Breast Pain     Breast pain ongoing for the past 2 weeks or so, she states not really in the breast tissue itself but feels more like rib cage underneath the left side.   Associated with chest palpitations ongoing for several months, haven't really gotten worse since this pain started.   She has also noticed more painful periods.   Migraines are still poorly controlled and she states that there is a different type pain more at bases head recently. She is following with neurology.   Reports widespread muscle aches/joint pain which seems to be getting worse over the past month or so.No fever/weightss, no night sweats.   Past medical history, surgical history, social history and family history reviewed.  Patient Active Problem List   Diagnosis Date Noted  . Acute pain of left knee 05/27/2017  . 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 Abnormal Pap smear of cervix 02/26/2017  . Depression, recurrent (HCC) 01/09/2017  . Chronic seasonal allergic rhinitis 11/27/2016  . Intractable chronic migraine without aura and with status migrainosus 04/08/2016    Current medication list and allergy/intolerance information reviewed.   Current Outpatient Prescriptions on File Prior to Visit  Medication Sig Dispense Refill  . baclofen (LIORESAL) 10 MG tablet Take 10 mg by mouth at bedtime.     . butalbital-acetaminophen-caffeine (FIORICET, ESGIC) 50-325-40 MG tablet Take 1 tablet by mouth every 6 (six) hours as needed for headache. 15 tablet 5  . clonazePAM (KLONOPIN) 0.5 MG tablet Take 0.5-1 tablets (0.25-0.5 mg total) by mouth 2 (two) times daily as needed for anxiety. Sparing use to prevent tolerance/dependence 15 tablet 1  . IBU 800 MG tablet Take 1 tablet (800 mg total) by mouth every 8 (eight) hours as needed  for mild pain or moderate pain. 60 tablet 0  . loratadine-pseudoephedrine (CLARITIN-D 24-HOUR) 10-240 MG 24 hr tablet Take 1 tablet by mouth daily. 30 tablet 11  . montelukast (SINGULAIR) 10 MG tablet Take 1 tablet (10 mg) by mouth daily in the evening 90 tablet 3  . norethindrone (MICRONOR,CAMILA,ERRIN) 0.35 MG tablet Take 1 tablet (0.35 mg total) by mouth daily. 1 Package 11  . Prenatal Vit-Fe Fumarate-FA (PRENATAL MULTIVITAMIN) TABS tablet Take 1 tablet by mouth at bedtime.    . ranitidine (ZANTAC) 150 MG tablet Take 150 mg by mouth 2 (two) times daily.    . sertraline (ZOLOFT) 50 MG tablet Take 1 tablet (50 mg) by mouth daily 90 tablet 3  . TROKENDI XR 200 MG CP24 Take 1 capsule by mouth at bedtime. 30 capsule 11   No current facility-administered medications on file prior to visit.    Allergies  Allergen Reactions  . Terbutaline Palpitations  . Sumatriptan   . Adhesive [Tape] Itching and Rash  . Ciprofloxacin Rash  . Zyrtec Allergy [Cetirizine Hcl] Other (See Comments)    Overly sleepy      Review of Systems:  Constitutional: No recent illness, no fever/chills, +fatigue  HEENT: +headache, no vision change  Cardiac: +chest pain, No  pressure, +palpitations  Respiratory:  No  shortness of breath. No  Cough  Gastrointestinal: No  abdominal pain, no change on bowel habits  Musculoskeletal: +myalgia/arthralgia  Skin: No  Rash  Neurologic: No  weakness, No  Dizziness   Exam:  BP 112/77  Pulse 87   Ht  (1.676 m)   Wt 126 lb (57.2 kg)   BMI 20.34 kg/m   Constitutional: VS see above. General Appearance: alert, well-developed, well-nourished, NAD  Eyes: Normal lids and conjunctive, non-icteric sclera  Ears, Nose, Mouth, Throat: MMM, Normal external inspection ears/nares/mouth/lips/gums.  Neck: No masses, trachea midline.   Respiratory: Normal respiratory effort. no wheeze, no rhonchi, no rales  Cardiovascular: S1/S2 normal, no murmur, no rub/gallop  auscultated. RRR.   Musculoskeletal: Gait normal. Symmetric and independent movement of all extremities. +tendernedd to palpation of ribs on L upper anterior side and below axilla   Neurological: Normal balance/coordination. No tremor.  Skin: warm, dry, intact.   Psychiatric: Normal judgment/insight. Normal mood and affect. Oriented x3.   BREAST: No rashes/skin changes, normal fibrous breast tissue, no masses or tenderness, normal nipple without discharge, normal axilla   EKG interpretation: Rate: 80  Rhythm: sinus No ST/T changes concerning for acute ischemia/infarct     ASSESSMENT/PLAN:   Myalgia - Plan: CK, Rheumatoid factor, Sedimentation rate, High sensitivity CRP, ANA  Mastalgia - more chest wall I think   Costochondritis - NSAIDs advised   Painful menstrual periods - Consdier change OCP though higher estrogen component would certainly risk breast pain and headaches  Palpitations - EKG no arrythmia, consider holter/echo/referral  - Plan: CBC, COMPLETE METABOLIC PANEL WITH GFR, Magnesium, TSH    Patient Instructions  Plan:  I think chest/breast pain more likely due to costochondritis which is inflammation of the rib cage. See below for information on other things which can help breast pain, ubt weaning from breastfeeding may be the solution as well.   Labs today to check for other reasons for heart flutters and diffuse pain.   If no obvious cause of palpitations, we should consider 48-hour heart monitor & ultrasound of the heart, plus cardiology referral.   If labs concerning for high levels of inflammatory markers indicating rheumatologic disease as cause of diffuse pain, we should consider referral to a rheumatologist for definitive diagnosis.   I'm not convinced that the Trokendi is cause of these symptoms, but if it's a concern and it's not helping headaches, please discuss your symptoms with Dr. Lucia Gaskins.   Breast pain:  Eliminate caffeine NSAID medications:  Ibuprofen 800 mg every 6 hours  Herbal therapies with variable efficacy  Vitamin E Supplementation up to 800 IU qd  Evening Primrose Oil 1500-3000 mg orally daily  Chaste Tree Berry 30 to 40 mg orally daily (PMS)    Follow-up plan: Return in about 1 week (around 08/14/2017) for recheck symptoms and discuss lab results .  Visit summary with medication list and pertinent instructions was printed for patient to review, alert [place] if any changes needed. All questions at time of visit were answered - patient instructed to contact office with any additional concerns. ER/RTC precautions were reviewed with the patient and understanding verbalized.   Note: Total time spent 40 minutes, greater than 50% of the visit was spent face-to-face counseling and coordinating care for the following: The primary encounter diagnosis was Myalgia. Diagnoses of Mastalgia, Costochondritis, Painful menstrual periods, and Palpitations were also pertinent to this visit.Marland Kitchen

## 2017-08-07 NOTE — Patient Instructions (Addendum)
Plan:  I think chest/breast pain more likely due to costochondritis which is inflammation of the rib cage. See below for information on other things which can help breast pain, ubt weaning from breastfeeding may be the solution as well.   Labs today to check for other reasons for heart flutters and diffuse pain.   If no obvious cause of palpitations, we should consider 48-hour heart monitor & ultrasound of the heart, plus cardiology referral.   If labs concerning for high levels of inflammatory markers indicating rheumatologic disease as cause of diffuse pain, we should consider referral to a rheumatologist for definitive diagnosis.   I'm not convinced that the Trokendi is cause of these symptoms, but if it's a concern and it's not helping headaches, please discuss your symptoms with Dr. Lucia Gaskins.   Breast pain:  Eliminate caffeine NSAID medications: Ibuprofen 800 mg every 6 hours  Herbal therapies with variable efficacy  Vitamin E Supplementation up to 800 IU qd  Evening Primrose Oil 1500-3000 mg orally daily  Chaste Tree Berry 30 to 40 mg orally daily (PMS)

## 2017-08-08 LAB — COMPLETE METABOLIC PANEL WITH GFR
AG Ratio: 1.8 (calc) (ref 1.0–2.5)
ALT: 6 U/L (ref 6–29)
AST: 10 U/L (ref 10–30)
Albumin: 4.5 g/dL (ref 3.6–5.1)
Alkaline phosphatase (APISO): 52 U/L (ref 33–115)
BUN: 14 mg/dL (ref 7–25)
CO2: 25 mmol/L (ref 20–32)
Calcium: 8.7 mg/dL (ref 8.6–10.2)
Chloride: 109 mmol/L (ref 98–110)
Creat: 1.04 mg/dL (ref 0.50–1.10)
GFR, Est African American: 79 mL/min/{1.73_m2} (ref >=60)
GFR, Est Non African American: 68 mL/min/{1.73_m2} (ref >=60)
Globulin: 2.5 g/dL (calc) (ref 1.9–3.7)
Glucose, Bld: 72 mg/dL (ref 65–99)
Potassium: 3.8 mmol/L (ref 3.5–5.3)
Sodium: 140 mmol/L (ref 135–146)
Total Bilirubin: 0.5 mg/dL (ref 0.2–1.2)
Total Protein: 7 g/dL (ref 6.1–8.1)

## 2017-08-08 LAB — ANA: Anti Nuclear Antibody(ANA): NEGATIVE

## 2017-08-08 LAB — CBC
HCT: 38.1 % (ref 35.0–45.0)
Hemoglobin: 13 g/dL (ref 11.7–15.5)
MCH: 31.1 pg (ref 27.0–33.0)
MCHC: 34.1 g/dL (ref 32.0–36.0)
MCV: 91.1 fL (ref 80.0–100.0)
MPV: 9 fL (ref 7.5–12.5)
Platelets: 243 10*3/uL (ref 140–400)
RBC: 4.18 10*6/uL (ref 3.80–5.10)
RDW: 12.4 % (ref 11.0–15.0)
WBC: 5.2 10*3/uL (ref 3.8–10.8)

## 2017-08-08 LAB — TSH: TSH: 1.07 mIU/L

## 2017-08-08 LAB — HIGH SENSITIVITY CRP: hs-CRP: 0.2 mg/L

## 2017-08-08 LAB — RHEUMATOID FACTOR: Rhuematoid fact SerPl-aCnc: <14 IU/mL (ref <14)

## 2017-08-08 LAB — MAGNESIUM: Magnesium: 2 mg/dL (ref 1.5–2.5)

## 2017-08-08 LAB — CK: Total CK: 35 U/L (ref 29–143)

## 2017-08-08 LAB — SEDIMENTATION RATE: Sed Rate: 2 mm/h (ref 0–20)

## 2017-08-14 ENCOUNTER — Ambulatory Visit (INDEPENDENT_AMBULATORY_CARE_PROVIDER_SITE_OTHER): Payer: Medicare Other | Admitting: Osteopathic Medicine

## 2017-08-14 ENCOUNTER — Encounter: Payer: Self-pay | Admitting: Osteopathic Medicine

## 2017-08-14 VITALS — BP 122/79 | HR 89 | Ht 66.0 in | Wt 125.0 lb

## 2017-08-14 DIAGNOSIS — M791 Myalgia, unspecified site: Secondary | ICD-10-CM

## 2017-08-14 DIAGNOSIS — M797 Fibromyalgia: Secondary | ICD-10-CM | POA: Diagnosis not present

## 2017-08-14 MED ORDER — CELECOXIB 100 MG PO CAPS: 100.0000 mg | ORAL_CAPSULE | Freq: Two times a day (BID) | ORAL | 1 refills | Status: DC

## 2017-08-14 NOTE — Addendum Note (Signed)
Addended by: Collie Siad on: 08/14/2017 12:24 PM   Modules accepted: Orders

## 2017-08-14 NOTE — Progress Notes (Signed)
HPI: Holly [name] is a 41 y.o. female  who presents to [facility] today, 08/14/17,  for chief complaint of:  Chief Complaint  Patient presents with  . Follow-up    LABS    Patient has been suffering from severe intractable migraines for several years. Following with neurology. Doing a little bit better on Trokendi with sparing use of Fioricet/Aleve. However over the past few months she has noticed significant increased body aches/pains, muscle aches. Occasionally waking her from sleep. No shortness of breath, no night sweats, no fever/chills.  We did workup with basic labs and rheumatology/inflammatory markers all of which were negative for any concerning organic pathology.   Past medical history, surgical history, social history and family history reviewed.  Patient Active Problem List   Diagnosis Date Noted  . Acute pain of left knee 05/27/2017  . 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 Abnormal Pap smear of cervix 02/26/2017  . Depression, recurrent (HCC) 01/09/2017  . Chronic seasonal allergic rhinitis 11/27/2016  . Intractable chronic migraine without aura and with status migrainosus 04/08/2016    Current medication list and allergy/intolerance information reviewed.   Current Outpatient Prescriptions on File Prior to Visit  Medication Sig Dispense Refill  . baclofen (LIORESAL) 10 MG tablet Take 10 mg by mouth at bedtime.     . butalbital-acetaminophen-caffeine (FIORICET, ESGIC) 50-325-40 MG tablet Take 1 tablet by mouth every 6 (six) hours as needed for headache. 15 tablet 5  . clonazePAM (KLONOPIN) 0.5 MG tablet Take 0.5-1 tablets (0.25-0.5 mg total) by mouth 2 (two) times daily as needed for anxiety. Sparing use to prevent tolerance/dependence 15 tablet 1  . IBU 800 MG tablet Take 1 tablet (800 mg total) by mouth every 8 (eight) hours as needed for mild pain or moderate pain. 60 tablet 0  . loratadine-pseudoephedrine (CLARITIN-D 24-HOUR)  10-240 MG 24 hr tablet Take 1 tablet by mouth daily. 30 tablet 11  . montelukast (SINGULAIR) 10 MG tablet Take 1 tablet (10 mg) by mouth daily in the evening 90 tablet 3  . norethindrone (MICRONOR,CAMILA,ERRIN) 0.35 MG tablet Take 1 tablet (0.35 mg total) by mouth daily. 1 Package 11  . Prenatal Vit-Fe Fumarate-FA (PRENATAL MULTIVITAMIN) TABS tablet Take 1 tablet by mouth at bedtime.    . ranitidine (ZANTAC) 150 MG tablet Take 150 mg by mouth 2 (two) times daily.    . sertraline (ZOLOFT) 50 MG tablet Take 1 tablet (50 mg) by mouth daily 90 tablet 3  . TROKENDI XR 200 MG CP24 Take 1 capsule by mouth at bedtime. 30 capsule 11   No current facility-administered medications on file prior to visit.    Allergies  Allergen Reactions  . Terbutaline Palpitations  . Sumatriptan   . Adhesive [Tape] Itching and Rash  . Ciprofloxacin Rash  . Zyrtec Allergy [Cetirizine Hcl] Other (See Comments)    Overly sleepy      Review of Systems:  Constitutional: No recent illness, no fever/chills, +fatigue  HEENT: +headache, no vision change  Cardiac: No chest pain, No  pressure, +palpitations on occasion  Respiratory:  No  shortness of breath. No  Cough  Gastrointestinal: No  abdominal pain, no change on bowel habits  Musculoskeletal: +myalgia/arthralgia  Skin: No  Rash  Neurologic: + Generalized but no focal weakness, No  Dizziness   Exam:  BP 122/79   Pulse 89   Ht  (1.676 m)   Wt 125 lb (56.7 kg)   BMI 20.18 kg/m  Constitutional: VS see above. General Appearance: alert, well-developed, well-nourished, NAD  Eyes: Normal lids and conjunctive, non-icteric sclera  Ears, Nose, Mouth, Throat: MMM, Normal external inspection ears/nares/mouth/lips/gums.  Neck: No masses, trachea midline.   Respiratory: Normal respiratory effort. no wheeze, no rhonchi, no rales  Cardiovascular: S1/S2 normal, no murmur, no rub/gallop auscultated. RRR.   Musculoskeletal: Gait normal. Symmetric and  independent movement of all extremities. +tendernedd to palpation of ribs on L upper anterior side and below axilla   Neurological: Normal balance/coordination. No tremor.  Skin: warm, dry, intact.   Psychiatric: Normal judgment/insight. Normal mood and affect. Oriented x3.    Previous chronic pain treatments include... TCA: Amitriptyline Yes  SSRI/SNRI: Duloxetine Yes  Muscle Relaxer: Cyclobenzaprine No  Anticonvulsants: Gabapentin Yes , Pregabalin No  Trazodone No  NSAID Yes  physical activity Yes     ASSESSMENT/PLAN:   Given nature of widespread chronic pain without other obvious organic cause or injuries/musculoskeletal process and its impact on her quality of life and ability to function, I'm inclined to treat as fibromyalgia.   Patient has tried multiple previous medications, previous NSAIDs cause a lot of stomach upset given history of irritable bowel as well per the patient.   We'll see if we can get Celebrex covered, she is also on Zantac. Advised we may need to go through prior authorization process and to be patient.   We'll work with neurology as well to help with migraine management, as I think effective treatment of this is going to be key to improving quality of life though she has started been through several different trials of therapy which haven't given much long-term success. ?clinical trial? ?immune modulator?   We had discussion about goals of pain control not necessarily pain elimination, focus on quality of life and ability to function   Myalgia - Plan: celecoxib (CELEBREX) 100 MG capsule  Fibromyalgia syndrome - Plan: celecoxib (CELEBREX) 100 MG capsule      Follow-up plan: Return in about 4 weeks (around 09/11/2017) for recheck pain symptoms .  Visit summary with medication list and pertinent instructions was printed for patient to review, alert [place] if any changes needed. All questions at time of visit were answered - patient instructed to contact  office with any additional concerns. ER/RTC precautions were reviewed with the patient and understanding verbalized.   Note: Total time spent 25 minutes, greater than 50% of the visit was spent face-to-face counseling and coordinating care for the following: The primary encounter diagnosis was Myalgia. A diagnosis of Fibromyalgia syndrome was also pertinent to this visit.Marland Kitchen

## 2017-08-19 ENCOUNTER — Encounter: Payer: Self-pay | Admitting: Osteopathic Medicine

## 2017-08-20 ENCOUNTER — Other Ambulatory Visit: Payer: Self-pay | Admitting: Osteopathic Medicine

## 2017-08-20 DIAGNOSIS — M797 Fibromyalgia: Secondary | ICD-10-CM

## 2017-08-20 DIAGNOSIS — M791 Myalgia, unspecified site: Secondary | ICD-10-CM

## 2017-08-20 MED ORDER — CELECOXIB 100 MG PO CAPS: 200.0000 mg | ORAL_CAPSULE | Freq: Two times a day (BID) | ORAL | 1 refills | Status: DC

## 2017-08-20 NOTE — Progress Notes (Signed)
Adjust dose celebrex

## 2017-09-05 ENCOUNTER — Encounter: Payer: Self-pay | Admitting: Physician Assistant

## 2017-09-05 ENCOUNTER — Ambulatory Visit (INDEPENDENT_AMBULATORY_CARE_PROVIDER_SITE_OTHER): Payer: Medicare Other | Admitting: Physician Assistant

## 2017-09-05 VITALS — BP 108/75 | HR 101 | Wt 123.0 lb

## 2017-09-05 DIAGNOSIS — G43711 Chronic migraine without aura, intractable, with status migrainosus: Secondary | ICD-10-CM

## 2017-09-05 MED ORDER — KETOROLAC TROMETHAMINE 30 MG/ML IJ SOLN
30.0000 mg | Freq: Once | INTRAMUSCULAR | Status: AC
  Administered 2017-09-05: 30 mg via INTRAMUSCULAR

## 2017-09-05 NOTE — Progress Notes (Signed)
HPI:                                                                Holly [name] is a 41 y.o. female who presents to [name] today for headache  Patient with PMH of chronic migraines w/hemiplegia, depression/GAD, GERD, IBS presents today for headache, which she describes as her usual migraine. Onset 4 days ago. Headache is above the left eye, describes pain as "axe-like" and severe. Associated phonophobia and photophobia. Reports numbness and tingling in her right hand. Reports word slurring. Has tried Fioricet, zofran, and Compazine. Reports headaches intiially resolved, but returned within hours. She is followed by Dr. Lucia Gaskins, Neurology. She takes Trokendi nightly for prophylaxis.  Past Medical History:  Diagnosis Date  . Depression   . GERD (gastroesophageal reflux disease)   . IBS (irritable bowel syndrome)   . Migraine   . MRSA (methicillin resistant staph aureus) culture positive    Past Surgical History:  Procedure Laterality Date  . WRIST SURGERY     cyst removal   Social History  Substance Use Topics  . Smoking status: Never Smoker  . Smokeless tobacco: Never Used  . Alcohol use No     Comment: none   family history includes Autoimmune disease in her father; Cancer in her sister; Crohn's disease in her father; Depression in her mother and sister; Diabetes in her father, paternal grandfather, and sister; Factor V Leiden deficiency in her father; Heart attack in her father and mother; Heart failure in her father; Hyperlipidemia in her father and mother; Hypertension in her mother; Migraines in her mother; Scoliosis in her mother; Thyroid disease in her mother.  ROS: negative except as noted in the HPI  Medications: Current Outpatient Prescriptions  Medication Sig Dispense Refill  . baclofen (LIORESAL) 10 MG tablet Take 10 mg by mouth at bedtime.     . butalbital-acetaminophen-caffeine (FIORICET, ESGIC) 50-325-40 MG  tablet Take 1 tablet by mouth every 6 (six) hours as needed for headache. 15 tablet 5  . clonazePAM (KLONOPIN) 0.5 MG tablet Take 0.5-1 tablets (0.25-0.5 mg total) by mouth 2 (two) times daily as needed for anxiety. Sparing use to prevent tolerance/dependence 15 tablet 1  . IBU 800 MG tablet Take 1 tablet (800 mg total) by mouth every 8 (eight) hours as needed for mild pain or moderate pain. 60 tablet 0  . loratadine-pseudoephedrine (CLARITIN-D 24-HOUR) 10-240 MG 24 hr tablet Take 1 tablet by mouth daily. 30 tablet 11  . montelukast (SINGULAIR) 10 MG tablet Take 1 tablet (10 mg) by mouth daily in the evening 90 tablet 3  . norethindrone (MICRONOR,CAMILA,ERRIN) 0.35 MG tablet Take 1 tablet (0.35 mg total) by mouth daily. 1 Package 11  . Prenatal Vit-Fe Fumarate-FA (PRENATAL MULTIVITAMIN) TABS tablet Take 1 tablet by mouth at bedtime.    . prochlorperazine (COMPAZINE) 10 MG tablet Take 10 mg by mouth every 6 (six) hours as needed for migraine.    . sertraline (ZOLOFT) 50 MG tablet Take 1 tablet (50 mg) by mouth daily 90 tablet 3  . TROKENDI XR 200 MG CP24 Take 1 capsule by mouth at bedtime. 30 capsule 11  . ranitidine (ZANTAC) 150 MG tablet Take 150 mg by mouth 2 (two) times  daily.     No current facility-administered medications for this visit.    Allergies  Allergen Reactions  . Terbutaline Palpitations  . Sumatriptan   . Adhesive [Tape] Itching and Rash  . Ciprofloxacin Rash  . Zyrtec Allergy [Cetirizine Hcl] Other (See Comments)    Overly sleepy       Objective:  BP 108/75   Pulse (!) 101   Wt 123 lb (55.8 kg)   BMI 19.85 kg/m  Gen: well-groomed, not ill-appearing, no acute distress HEENT: head normocephalic, atraumatic; conjunctiva and cornea clear, oropharynx clear, moist mucus membranes Pulm: Normal work of breathing, normal phonation Neuro:  cranial nerves II-XII intact, no nystagmus, normal finger-to-nose, normal heel-to-shin, negative pronator drift, normal coordination,  DTR's intact, normal tone, no tremor MSK: strength 5/5 and symmetric in bilateral upper and lower extremities, normal gait and station Mental Status: alert and oriented x 3, speech articulate, and thought processes clear and goal-directed    No results found for this or any previous visit (from the past 72 hour(s)). No results found.    Assessment and Plan: 41 y.o. female with   1. Intractable chronic migraine without aura and with status migrainosus - reassuring neuro exam, no focal deficits - IM Toradol given in office today. Patient declined decadron and reglan   Patient education and anticipatory guidance given Patient agrees with treatment plan Follow-up as needed if symptoms worsen or fail to improve  Levonne Hubertharley E. Bryauna Byrum PA-C

## 2017-09-11 ENCOUNTER — Encounter: Payer: Self-pay | Admitting: Osteopathic Medicine

## 2017-09-11 ENCOUNTER — Other Ambulatory Visit: Payer: Self-pay | Admitting: Osteopathic Medicine

## 2017-09-11 ENCOUNTER — Ambulatory Visit (INDEPENDENT_AMBULATORY_CARE_PROVIDER_SITE_OTHER): Payer: Medicare Other | Admitting: Osteopathic Medicine

## 2017-09-11 VITALS — BP 111/73 | HR 98 | Wt 123.0 lb

## 2017-09-11 DIAGNOSIS — G43419 Hemiplegic migraine, intractable, without status migrainosus: Secondary | ICD-10-CM | POA: Diagnosis not present

## 2017-09-11 DIAGNOSIS — M797 Fibromyalgia: Secondary | ICD-10-CM

## 2017-09-11 MED ORDER — OMEPRAZOLE 20 MG PO CPDR: 20.0000 mg | DELAYED_RELEASE_CAPSULE | Freq: Every day | ORAL | 3 refills | Status: DC

## 2017-09-11 NOTE — Progress Notes (Signed)
HPI: Holly [name] is a 41 y.o. female  who presents to [name] today, 09/11/17,  for chief complaint of:  Chief Complaint  Patient presents with  . Follow-up    Celebrex is not working, patient stated that she broke out in a rash    Patient has been suffering from severe intractable migraines for several years. Following with neurology. Doing a little bit better on Trokendi with sparing use of Fioricet/Aleve. However over the past few months she has noticed significant increased body aches/pains, muscle aches. Occasionally waking her from sleep. No shortness of breath, no night sweats, no fever/chills. We tried Celebrex, this caused rash. We did workup with basic labs and rheumatology/inflammatory markers all of which were negative for any concerning organic pathology.  She continues to have migraines and body aches. No major change.   She has followup with neurology in place, hoping to discuss injectable versus botox.   Current meds: Baclofen 10 mg qhs - not much help Ibuprofen 800 mg prn - trying to avoid d/t concern for rebound Fioricet - same, avoiding if possible Compazine prn migraine Sertaline 50 daily - helping more than the Cymbalta Trokendi XR 200 mg daily   Previous meds: Imitrex (concern for triptans in hemiplegic migraines) Fioricet/Codeine Klonopin (for anxiety, hasn't been taking this) Celebrex (caused rash) Ketorolac Seroquel Cymbalta Topiramate Ambien Phenergan Amitriptyline  Nortriptyline  Gabapentin    Past medical history, surgical history, social history and family history reviewed.  Patient Active Problem List   Diagnosis Date Noted  . Acute pain of left knee 05/27/2017  . 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 Abnormal Pap smear of cervix 02/26/2017  . Depression, recurrent (HCC) 01/09/2017  . Chronic seasonal allergic rhinitis 11/27/2016  . Intractable chronic migraine without aura and with status  migrainosus 04/08/2016    Current medication list and allergy/intolerance information reviewed.   Current Outpatient Prescriptions on File Prior to Visit  Medication Sig Dispense Refill  . baclofen (LIORESAL) 10 MG tablet Take 10 mg by mouth at bedtime.     . butalbital-acetaminophen-caffeine (FIORICET, ESGIC) 50-325-40 MG tablet Take 1 tablet by mouth every 6 (six) hours as needed for headache. 15 tablet 5  . IBU 800 MG tablet Take 1 tablet (800 mg total) by mouth every 8 (eight) hours as needed for mild pain or moderate pain. 60 tablet 0  . loratadine-pseudoephedrine (CLARITIN-D 24-HOUR) 10-240 MG 24 hr tablet Take 1 tablet by mouth daily. 30 tablet 11  . montelukast (SINGULAIR) 10 MG tablet Take 1 tablet (10 mg) by mouth daily in the evening 90 tablet 3  . norethindrone (MICRONOR,CAMILA,ERRIN) 0.35 MG tablet Take 1 tablet (0.35 mg total) by mouth daily. 1 Package 11  . Prenatal Vit-Fe Fumarate-FA (PRENATAL MULTIVITAMIN) TABS tablet Take 1 tablet by mouth at bedtime.    . prochlorperazine (COMPAZINE) 10 MG tablet Take 10 mg by mouth every 6 (six) hours as needed for migraine.    . sertraline (ZOLOFT) 50 MG tablet Take 1 tablet (50 mg) by mouth daily 90 tablet 3  . TROKENDI XR 200 MG CP24 Take 1 capsule by mouth at bedtime. 30 capsule 11   No current facility-administered medications on file prior to visit.    Allergies  Allergen Reactions  . Terbutaline Palpitations  . Sumatriptan   . Adhesive [Tape] Itching and Rash  . Ciprofloxacin Rash  . Zyrtec Allergy [Cetirizine Hcl] Other (See Comments)    Overly sleepy      Review of  Systems:  Constitutional: No recent illness, no fever/chills, +fatigue  HEENT: +headache, no vision change  Cardiac: No chest pain, No  pressure, +palpitations on occasion  Respiratory:  No  shortness of breath. No  Cough  Gastrointestinal: No  abdominal pain, no change on bowel habits  Musculoskeletal: +myalgia/arthralgia  Skin: No   Rash  Neurologic: + Generalized but no focal weakness, No  Dizziness   Exam:  BP 111/73   Pulse 98   Wt 123 lb (55.8 kg)   BMI 19.85 kg/m   Constitutional: VS see above. General Appearance: alert, well-developed, well-nourished, NAD  Eyes: Normal lids and conjunctive, non-icteric sclera  Ears, Nose, Mouth, Throat: MMM, Normal external inspection ears/nares/mouth/lips/gums.  Neck: No masses, trachea midline.   Respiratory: Normal respiratory effort. no wheeze, no rhonchi, no rales  Cardiovascular: S1/S2 normal, no murmur +tendernedd to palpation of ribs on L upper anterior side and below axilla   Neurological: Normal balance/coordination. No tremor.  Skin: warm, dry, intact.   Psychiatric: Normal judgment/insight. Normal mood and affect. Oriented x3.      ASSESSMENT/PLAN: The primary encounter diagnosis was Intractable hemiplegic migraine without status migrainosus. A diagnosis of Fibromyalgia syndrome was also pertinent to this visit.   Given nature of widespread chronic pain without other obvious organic cause or injuries/musculoskeletal process and its impact on her quality of life and ability to function, I'm inclined to treat as fibromyalgia.  However, we are in a bit of a tough spot as she has tried and failed multiple medication modalities for fibromyalgia type pain and for chronic headache  We'll work with neurology as well to help with migraine management, as I think effective treatment of this is going to be key to improving quality of life though she has started been through several different trials of therapy which haven't given much long-term success. ?clinical trial? ?immune modulator?   We had discussion about goals of pain control not necessarily pain elimination, focus on quality of life and ability to function   Follow-up plan: Return for recheck with Dr Lyn HollingsheadAlexander as needed .  Visit summary with medication list and pertinent instructions was printed for  patient to review, alert us if any changes needed. All questions at time of visit were answered - patient instructed to contact office with any additional concerns. ER/RTC precautions were reviewed with the patient and understanding verbalized.   Note: Total time spent 25 minutes, greater than 50% of the visit was spent face-to-face counseling and coordinating care for the following: The primary encounter diagnosis was Intractable hemiplegic migraine without status migrainosus. A diagnosis of Fibromyalgia syndrome was also pertinent to this visit..Marland Kitchen

## 2017-09-16 ENCOUNTER — Telehealth: Payer: Self-pay | Admitting: *Deleted

## 2017-09-16 ENCOUNTER — Encounter: Payer: Self-pay | Admitting: Neurology

## 2017-09-16 ENCOUNTER — Other Ambulatory Visit: Payer: Self-pay | Admitting: *Deleted

## 2017-09-16 ENCOUNTER — Ambulatory Visit (INDEPENDENT_AMBULATORY_CARE_PROVIDER_SITE_OTHER): Payer: Medicare Other | Admitting: Neurology

## 2017-09-16 VITALS — BP 102/64 | HR 100 | Wt 124.4 lb

## 2017-09-16 DIAGNOSIS — G43401 Hemiplegic migraine, not intractable, with status migrainosus: Secondary | ICD-10-CM

## 2017-09-16 MED ORDER — FREMANEZUMAB-VFRM 225 MG/1.5ML ~~LOC~~ SOSY
225.0000 mg | PREFILLED_SYRINGE | SUBCUTANEOUS | 12 refills | Status: DC
Start: 2017-09-16 — End: 2017-12-05

## 2017-09-16 NOTE — Progress Notes (Signed)
[facility]    Provider:  Dr Lucia Gaskins Referring Provider: Sunnie Nielsen, DO Primary Care Physician:  Sunnie Nielsen, DO  CC:  Hemiplegic migraines  Interval history 09/16/2017: She has been diagnosed with fibromyalgia. She is allergic to Celebrex. She is seeing her pcp and they may start Lyrica, which is also a migraine medication. She saw another doctor, who is a good neurologist and I recommend following with them. She uses Fioricet which I don't prefer but it is the only think that helps her, she has been advised about rebound headache. She has also seen Dr. Catalina Lunger at the [facility].   Interval History 08/07/2016:  ABIR EROH is a 41 y.o. 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 She has severe migraines what impair her both physicilly and cognitively, she can't even remember her own name, she can;t walk straight, her balance is impaired, she has slurred speech, she stutters, can;t take care of her 6 children or at times herself when recurrence of migraines. 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 The last several have been extreme in her severity. She has gone to the emergency room multiple times for severe refractory migraines. Patient's migraines have improved with Topiramate but they are still occurring 3-4 days a week and can be severe when she has a migraine 10 days in a row. She takes Topiramate 200mg  daily. She is breastfeeding. When she has migraines she is debilitated often and has to stay in bed in a dark room all day and her husband has had to stay home. She has 15-17 migraine days in a month. She is weaning her last child this last month and her hormones are fluctuating. Will have the Botox representative call.   Meds tried: Topiramate,Amitrip, nortrip. Currently on topamax 200mg  daily. Also tried  venlafaxine, imitrex, maxalt, treximet. Blood pressure medications are contraindicated due to her very low blood pressure.   [name] 05/29/2016: Today 04/29/2016: Ms. Greenly is a 41 year old female with a history of migraine headaches. She returns today for follow-up. She states that her headaches have improved with Topamax. She states she typically has 1-2 headaches a week. Before she was having 3-5 headaches a week. She is currently taken Topamax 150 mg at bedtime. She reports that with her migraines she is experiencing weakness on the right side typically in the arm but it can move to the leg. She also noticed slurring speech and stuttering. In the past the symptoms have been worrisome and she went to the emergency room thinking she was having a stroke. She was told that it was migraine headache. She reports that the symptoms never occur without a migraine. She uses caffeine, Tylenol, Advil and Fioricet in that order to treat her acute headaches. In the past she has tried triptans and Cambia with minimal benefit. She states baclofen was helpful for her headaches however it stopped her milk production. She plans to continue breast-feeding to the child is 36 months old. She returns today for an evaluation.    HPI 04/06/2016:Kemisha A Garrettis a 41 y.o.femalehere as a referral from Dr. Duanne Guess. She has had migraines for 21 years. She has tried a lot of things. Migraines are affecting her speech and she is slurring stuttering with the migraines. She trained in nursing and psychology. She has daily headaches, migraines. They are on the left side 98% of the  time, behind the eye, throbbing and pounding like a "pick axe", laying down in a dark room helps, endorses extreme sounds sensitivity, light sensitivity, smell triggers, no aura. 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 No medication overuse. She had extensive testing at the [facility] with MRIs and LPs. She has nausea and vomiting  with the migraines. No inciting event, no head trauma, has a FHx of migraines. Lack of sleep worsens, drinks caffeine once a day. She drink enough fluid. She was bedridden for a whole year with migraines. She has daily migraines that can last all day.No other focal neurologic deficits. She does have musculoskeletal neck pain.  She has tried everything: Only Fioricet and Vicodin helps.That is the only thing she takes. She tried Amitrip, nortrip. Currently on topamax 75mg  daily. Also tried venlafaxine, imitrex, maxalt, treximet.   Never tried propranolol  Reviewed notes, labs and imaging from outside physicians, which showed:  CT if the head 2004: CLINICAL DATA: HEADACHE. RIGHT AND LEFT-SIDED NUMBNESS OF ARMS AND LEGS. BLURRED VISION FOR TWO WEEKS. TWO MONTHS POST PARTUM. PREVIOUS MRI OF THE BRAIN IN 1998.CT SCAN OF THE HEAD WITHOUT CONTRAST WITH BONE WINDOWS A SERIES OF SCANS OF THE ENTIRE HEAD ARE MADE WITHOUT CONTRAST AND SHOW NO EVIDENCE OF INTRACRANIAL MASS OR HEMORRHAGE. THERE IS NO SHIFT OF MIDLINE STRUCTURES. THE VENTRICULAR SYSTEM APPEARS NORMAL. BONE WINDOWS SHOW THE PARANASAL SINUSES, BASE OF THE SKULL, BONY CALVARIUM, AND INTERNAL AUDITORY CANALS TO BE NORMAL. IMPRESSION NORMAL CT SCAN OF THE HEAD WITHOUT CONTRAST.  RPR, HIV negative.   Reviewed notes from OBGyn: Pt has had migraines for 20 years. Patient reports continuous migraine for 3 months. She is on Topamax 75mg  qhs. She has tunnel vision with migraines. Cries unconsolably with blood in her tears. She has diarrhea with the Topamax but wants to continue it. She is nursing her baby and is almost done, decreased sleep and fatigue is worsening migraines. 5 trips to the ER in the last 6 weeks. Imitrex gave her a panic attack and numbness in the face. Exam was unremarkable. On Zofran and Topiramate.  Review of Systems: Patient complains of symptoms per HPI as well as the following symptoms:joint pain, loss of vision,  numbness, weakness, No CP, No SOB. Pertinent negatives per HPI. All others negative.    Social History   Social History  . Marital status: Married    Spouse name: N/A  . Number of children: 6  . Years of education: N/A   Occupational History  . Home Maker    Social History Main Topics  . Smoking status: Never Smoker  . Smokeless tobacco: Never Used  . Alcohol use No     Comment: none  . Drug use: No  . Sexual activity: Yes    Birth control/ protection: None   Other Topics Concern  . Not on file   Social History Narrative   1 caffeine drink a day at max   Right handed   Lives at home with children. Husband is a Naval architect and rarely home.    Family History  Problem Relation Age of Onset  . Thyroid disease Mother   . Scoliosis Mother   . Depression Mother   . Migraines Mother   . Hyperlipidemia Mother   . Hypertension Mother   . Heart attack Mother   . Crohn's disease Father   . Autoimmune disease Father   . Factor V Leiden deficiency Father   . Heart failure Father   .  Diabetes Father   . Hyperlipidemia Father   . Heart attack Father   . Cancer Sister   . Depression Sister   . Diabetes Sister   . Diabetes Paternal Grandfather     Past Medical History:  Diagnosis Date  . Depression   . Fibromyalgia 2018  . GERD (gastroesophageal reflux disease)   . IBS (irritable bowel syndrome)   . Migraine   . MRSA (methicillin resistant staph aureus) culture positive     Past Surgical History:  Procedure Laterality Date  . WRIST SURGERY     cyst removal    Current Outpatient Prescriptions  Medication Sig Dispense Refill  . butalbital-acetaminophen-caffeine (FIORICET, ESGIC) 50-325-40 MG tablet Take 1 tablet by mouth every 6 (six) hours as needed for headache. 15 tablet 5  . IBU 800 MG tablet Take 1 tablet (800 mg total) by mouth every 8 (eight) hours as needed for mild pain or moderate pain. 60 tablet 0  . loratadine-pseudoephedrine (CLARITIN-D 24-HOUR)  10-240 MG 24 hr tablet Take 1 tablet by mouth daily. 30 tablet 11  . montelukast (SINGULAIR) 10 MG tablet Take 1 tablet (10 mg) by mouth daily in the evening 90 tablet 3  . norethindrone (MICRONOR,CAMILA,ERRIN) 0.35 MG tablet Take 1 tablet (0.35 mg total) by mouth daily. 1 Package 11  . omeprazole (PRILOSEC) 20 MG capsule Take 1 capsule (20 mg total) by mouth daily. 90 capsule 3  . prochlorperazine (COMPAZINE) 10 MG tablet Take 10 mg by mouth every 6 (six) hours as needed for migraine.    . sertraline (ZOLOFT) 50 MG tablet Take 1 tablet (50 mg) by mouth daily 90 tablet 3  . TROKENDI XR 200 MG CP24 Take 1 capsule by mouth at bedtime. 30 capsule 11   No current facility-administered medications for this visit.     Allergies as of 09/16/2017 - Review Complete 09/16/2017  Allergen Reaction Noted  . Terbutaline Palpitations 05/26/2015  . Sumatriptan  04/06/2016  . Adhesive [tape] Itching and Rash 12/30/2012  . Celebrex [celecoxib] Rash 09/11/2017  . Ciprofloxacin Rash 05/26/2015  . Zyrtec allergy [cetirizine hcl] Other (See Comments) 05/26/2015    Vitals: BP 102/64   Pulse 100   Wt 124 lb 6.4 oz (56.4 kg)   BMI 20.08 kg/m  Last Weight:  Wt Readings from Last 1 Encounters:  09/16/17 124 lb 6.4 oz (56.4 kg)   Last Height:   Ht Readings from Last 1 Encounters:  08/14/17 5\' 6"  (1.676 m)   Physical exam: Exam: Gen: NAD, conversant, well nourised, obese, well groomed                     CV: RRR, no MRG. No Carotid Bruits. No peripheral edema, warm, nontender Eyes: Conjunctivae clear without exudates or hemorrhage  Neuro: Detailed Neurologic Exam  Speech:    Speech is normal; fluent and spontaneous with normal comprehension.  Cognition:    The patient is oriented to person, place, and time;     recent and remote memory intact;     language fluent;     normal attention, concentration,     fund of knowledge Cranial Nerves:    The pupils are equal, round, and reactive to light.  The fundi are normal and spontaneous venous pulsations are present. Visual fields are full to finger confrontation. Extraocular movements are intact. Trigeminal sensation is intact and the muscles of mastication are normal. The face is symmetric. The palate elevates in the midline. Hearing intact. Voice is normal.  Shoulder shrug is normal. The tongue has normal motion without fasciculations.   Coordination:    Normal finger to nose and heel to shin. Normal rapid alternating movements.   Gait:    Heel-toe and tandem gait are normal.   Motor Observation:    No asymmetry, no atrophy, and no involuntary movements noted. Tone:    Normal muscle tone.    Posture:    Posture is normal. normal erect    Strength:    Strength is V/V in the upper and lower limbs.      Sensation: intact to LT     Reflex Exam:  DTR's:    Deep tendon reflexes in the upper and lower extremities are normal bilaterally.   Toes:    The toes are downgoing bilaterally.   Clonus:    Clonus is absent.      Assessment/Plan:41 year old with intractable chronicsevere migraines with hemiplegia and severe cognitive impairment with the migraine episodes.   - She uses Fioricet which I don't prefer but it is the only think that helps her, she has been advised about rebound headache. Have discussed and tried other options, cambia, baclofen, tizanidine. - Cambia - specialty pharmacy. - Topamax: continue 200mg  qhs Trokendi helps with less side effects. Discussed it may interfere with birth control and to use bac up, also teratogenicity - Side effects to triptans, she had a sever reaction doesn't want to try it again - zofran for nausea and migraines - Baclofen prn for migraines - She is not breastfeeding anymore,  - Discussed botox for migraine, at this point she has failed medications, is on high dose Topamax, feel botox would be a possible next step - She uses Fioricet which I don't prefer but it is the only think that  helps her, she has been advised about rebound headache.  - Discussed new CGRP medications, botox, other oral preventative migraine medications - Discussed PFO association with migraine with aura, there is an associsation but no clear guidelines to treat patients with asymptomatic PFO with migraines with aura.  - For her fibromyalgia, needs follow up with pcp. Lyrica would be ok but would need know that there can be increased cognitive side effects on 2 AEDs, cymbalta is also an option but would have to transition off of prozac, I recommend follow up with pcp for fibromyalgia. - Discussed rebound headache, do not take acute medications more than 10 days a week.  - Suggest Ajovy, provided information on this medication, side effects   Discussed: There is increased risk for stroke in women with migraine with aura and a  Contraindication for the combined contraceptive pill for use by women who have migraine with aura, which is in line with World Health Organisation recommendations. The risk for women with migraine without aura is lower and other risk factors like smoking are far more likely to increase stroke risk than migraine. There is a recommendation for no smoking and for the use of low estrogen or progestogen only pills particularly for women with migraine with aura. It is important however that women with migraine who are taking the pill do not decide to suddenly stop taking it without discussing this with their doctor. Please discuss with your OB/GYN and best option would be not taking estrogen due to risk of stroke.  Provided Ajovy patient education materials from their website  Naomie Dean, MD  [facility] [address] [place], [place] [address]  Phone [phone] Fax [phone]  A total  of 25 minutes was spent face-to-face with this patient. Over half this time was spent on counseling patient on the hemiplegic migraine diagnosis and different  diagnostic and therapeutic options available.

## 2017-09-16 NOTE — Patient Instructions (Addendum)
Fremanezumab-vfrm: Patient drug information [employer] here. Copyright [phone] [employer] All rights reserved. (For additional information see "Fremanezumab-vfrm: Drug information") Brand Names: [place]  Ajovy  What is this drug used for?   It is used to prevent migraine headaches.  What do I need to tell my doctor BEFORE I take this drug?   If you have an allergy to this drug or any part of this drug.   If you are allergic to any drugs like this one, any other drugs, foods, or other substances. Tell your doctor about the allergy and what signs you had, like rash; hives; itching; shortness of breath; wheezing; cough; swelling of face, lips, tongue, or throat; or any other signs.   This drug may interact with other drugs or health problems.   Tell your doctor and pharmacist about all of your drugs (prescription or OTC, natural products, vitamins) and health problems. You must check to make sure that it is safe for you to take this drug with all of your drugs and health problems. Do not start, stop, or change the dose of any drug without checking with your doctor.  What are some things I need to know or do while I take this drug?   Tell all of your health care providers that you take this drug. This includes your doctors, nurses, pharmacists, and dentists.   Allergic reactions have happened within hours and up to 1 month after this drug was given. Tell your doctor right away about any bad effects after getting this drug.   Tell your doctor if you are pregnant or plan on getting pregnant. You will need to talk about the benefits and risks of using this drug while you are pregnant.   Tell your doctor if you are breast-feeding. You will need to talk about any risks to your baby.  What are some side effects that I need to call my doctor about right away?   WARNING/CAUTION: Even though it may be rare, some people may have very bad and sometimes deadly side effects when taking a drug.  Tell your doctor or get medical help right away if you have any of the following signs or symptoms that may be related to a very bad side effect:   Signs of an allergic reaction, like rash; hives; itching; red, swollen, blistered, or peeling skin with or without fever; wheezing; tightness in the chest or throat; trouble breathing, swallowing, or talking; unusual hoarseness; or swelling of the mouth, face, lips, tongue, or throat.   Very bad irritation where the shot was given.  What are some other side effects of this drug?   All drugs may cause side effects. However, many people have no side effects or only have minor side effects. Call your doctor or get medical help if any of these side effects or any other side effects bother you or do not go away:   Irritation where the shot is given.   These are not all of the side effects that may occur. If you have questions about side effects, call your doctor. Call your doctor for medical advice about side effects.   You may report side effects to your national health agency.  How is this drug best taken?   Use this drug as ordered by your doctor. Read all information given to you. Follow all instructions closely.   It is given as a shot into the fatty part of the skin on the top of the thigh, belly area,  or upper arm.   If you will be giving yourself the shot, your doctor or nurse will teach you how to give the shot.   Follow how to use as you have been told by the doctor or read the package insert.   Wash your hands before use.   If stored in a refrigerator, let this drug come to room temperature before using it. Leave it at room temperature for at least 30 minutes. Do not heat this drug.   Do not shake.   Do not give into skin that is irritated, bruised, red, infected, or scarred.   Do not give into the same place as another shot.   If your dose is more than 1 injection, you may give it in the same body part. Make sure you do not give injections in  the same spot you used for the other injections.   Do not use if the solution is cloudy, leaking, or has particles.   This drug is colorless to a faint yellow. Do not use if the solution changes color.   Each prefilled syringe is for one use only.   Throw away needles in a needle/sharp disposal box. Do not reuse needles or other items. When the box is full, follow all local rules for getting rid of it. Talk with a doctor or pharmacist if you have any questions.  What do I do if I miss a dose?   Take a missed dose as soon as you think about it.   After taking a missed dose, start a new schedule based on when the dose is taken.  How do I store and/or throw out this drug?   Store in a refrigerator. Do not freeze.   Store in the carton to protect from light.   If needed, this drug can be left out at room temperature for up to 24 hours. Throw away drug if left at room temperature for more than 24 hours.   Protect from heat and sunlight.   Keep all drugs in a safe place. Keep all drugs out of the reach of children and pets.   Throw away unused or expired drugs. Do not flush down a toilet or pour down a drain unless you are told to do so. Check with your pharmacist if you have questions about the best way to throw out drugs. There may be drug take-back programs in your area.  General drug facts   If your symptoms or health problems do not get better or if they become worse, call your doctor.   Do not share your drugs with others and do not take anyone else's drugs.   Keep a list of all your drugs (prescription, natural products, vitamins, OTC) with you. Give this list to your doctor.   Talk with the doctor before starting any new drug, including prescription or OTC, natural products, or vitamins.   Some drugs may have another patient information leaflet. If you have any questions about this drug, please talk with your doctor, nurse, pharmacist, or other health care provider.   If you think there has  been an overdose, call your poison control center or get medical care right away. Be ready to tell or show what was taken, how much, and when it happened.  Use of UpToDate is subject to the Subscription and License Agreement. Topic [id number] Version 1.0  IMPORTANT SAFETY INFORMATION Do not use AJOVY if you are allergic to AJOVY or any of the ingredients in AJOVY.  AJOVY may cause allergic reactions, including itching, rash, and hives that can happen within hours and up to 1 month after receiving AJOVY. Call your healthcare provider or get emergency medical help right away if you have any symptoms of an allergic reaction: swelling of your face, mouth, tongue, throat, or if you have trouble breathing. Talk to your doctor about stopping AJOVY if you have an allergic reaction.  Tell your healthcare provider about all the medicines you take, and if you are pregnant, planning to become pregnant, or are breastfeeding.  Common side effects of AJOVY include injection site reactions.  Tell your healthcare provider if you have any side effect that bothers you or that does not go away. These are not all the possible side effects of AJOVY. For more information, ask your healthcare provider or pharmacist.  Call your doctor for medical advice about side effects.  You are encouraged to report side effects to the FDA at 1-800-FDA-1088.     APPROVED USE AJOVYTM is a prescription medicine used to prevent migraine in adults.

## 2017-09-16 NOTE — Telephone Encounter (Signed)
Called patient, she is aware NOT to breastfeed while taking Ajovy. She verbalized understanding.

## 2017-09-18 ENCOUNTER — Emergency Department (INDEPENDENT_AMBULATORY_CARE_PROVIDER_SITE_OTHER)
Admission: EM | Admit: 2017-09-18 | Discharge: 2017-09-18 | Disposition: A | Discharge status: Home / Self Care | Payer: Medicare Other | Source: Home / Self Care | Attending: Emergency Medicine | Admitting: Emergency Medicine

## 2017-09-18 ENCOUNTER — Emergency Department (INDEPENDENT_AMBULATORY_CARE_PROVIDER_SITE_OTHER): Payer: Medicare Other

## 2017-09-18 ENCOUNTER — Encounter: Payer: Self-pay | Admitting: *Deleted

## 2017-09-18 DIAGNOSIS — S0033XA Contusion of nose, initial encounter: Secondary | ICD-10-CM | POA: Diagnosis not present

## 2017-09-18 DIAGNOSIS — S098XXA Other specified injuries of head, initial encounter: Secondary | ICD-10-CM

## 2017-09-18 DIAGNOSIS — W500XXA Accidental hit or strike by another person, initial encounter: Secondary | ICD-10-CM | POA: Diagnosis not present

## 2017-09-18 DIAGNOSIS — S0992XA Unspecified injury of nose, initial encounter: Secondary | ICD-10-CM | POA: Diagnosis not present

## 2017-09-18 HISTORY — DX: Hemiplegia, unspecified affecting unspecified side: G81.90

## 2017-09-18 MED ORDER — HYDROCODONE-ACETAMINOPHEN 5-325 MG PO TABS: ORAL_TABLET | ORAL | 0 refills | Status: DC
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
## 2017-09-18 NOTE — Discharge Instructions (Signed)
Apply ice to nose as frequently as possible. You cannot breast-feed while taking your pain medication. Only take one half tablet of the pain medication if needed for severe pain.B sure you follow-up with your primary care physician.
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
## 2017-09-19 ENCOUNTER — Telehealth: Payer: Self-pay

## 2017-09-19 NOTE — Telephone Encounter (Signed)
I received an prior auth request for ajovy. I have completed and submitted the PA and should have a determination within 48-72 hours.  CoverMyMeds Key: [id number]

## 2017-09-20 NOTE — Telephone Encounter (Signed)
Thank you, what is the protocol after this? I think we have 90 days for appeal correct?

## 2017-09-20 NOTE — Telephone Encounter (Signed)
Received denial from insurance. Reasons listed below.   [id number]. AJOVY INJ 225/1.5 is denied for not meeting the prior authorization requirement(s).   AJOVY is one of the drugs that are excluded from Medicare coverage by law, and we do not offer the drug as a supplemental benefit.  Ajovy 225mg /1.285ml injection is denied. The requested medication/device is not a Part D eligible medication as defined by the Medicare Part D benefit and is not covered under your Part D prescription drug plan. Reviewed by: Thurston PoundsQLN, Pharm.D.

## 2017-09-23 NOTE — Telephone Encounter (Signed)
Pt called inquiring if appeal has been started. I advised her a msg had been sent to RN, she was appreciative  [phone]

## 2017-09-23 NOTE — Telephone Encounter (Signed)
Needs appeal letter

## 2017-09-24 DIAGNOSIS — G43709 Chronic migraine without aura, not intractable, without status migrainosus: Secondary | ICD-10-CM | POA: Diagnosis not present

## 2017-09-24 DIAGNOSIS — G43409 Hemiplegic migraine, not intractable, without status migrainosus: Secondary | ICD-10-CM | POA: Diagnosis not present

## 2017-09-26 ENCOUNTER — Encounter: Payer: Self-pay | Admitting: Osteopathic Medicine

## 2017-09-26 DIAGNOSIS — G43711 Chronic migraine without aura, intractable, with status migrainosus: Secondary | ICD-10-CM

## 2017-09-26 DIAGNOSIS — M797 Fibromyalgia: Secondary | ICD-10-CM

## 2017-09-26 DIAGNOSIS — G43419 Hemiplegic migraine, intractable, without status migrainosus: Secondary | ICD-10-CM

## 2017-09-26 NOTE — Telephone Encounter (Signed)
Attempted call-in appeal to the number provided by [employer] saying they accept call-in appeals only. Was unable to get transferred to the correct person to assist with this process.

## 2017-09-27 ENCOUNTER — Telehealth: Payer: Self-pay | Admitting: Osteopathic Medicine

## 2017-09-30 DIAGNOSIS — M797 Fibromyalgia: Secondary | ICD-10-CM | POA: Insufficient documentation

## 2017-10-02 ENCOUNTER — Encounter: Payer: Self-pay | Admitting: *Deleted

## 2017-10-02 NOTE — Telephone Encounter (Signed)
Letter of appeal written and office notes printed for support. Letter awaiting Dr. Lucia GaskinsAhern signature.

## 2017-10-03 NOTE — Telephone Encounter (Signed)
error 

## 2017-10-04 ENCOUNTER — Other Ambulatory Visit: Payer: Self-pay | Admitting: Osteopathic Medicine

## 2017-10-04 DIAGNOSIS — G43409 Hemiplegic migraine, not intractable, without status migrainosus: Secondary | ICD-10-CM

## 2017-10-07 NOTE — Telephone Encounter (Signed)
Signed Ajovy appeal letter and office notes faxed to [phone]. Received a receipt of confirmation.

## 2017-10-11 ENCOUNTER — Other Ambulatory Visit: Payer: Self-pay | Admitting: Osteopathic Medicine

## 2017-10-11 DIAGNOSIS — J302 Other seasonal allergic rhinitis: Secondary | ICD-10-CM

## 2017-10-29 ENCOUNTER — Ambulatory Visit: Payer: Medicare Other | Admitting: Osteopathic Medicine

## 2017-11-05 ENCOUNTER — Ambulatory Visit (INDEPENDENT_AMBULATORY_CARE_PROVIDER_SITE_OTHER): Payer: Medicare Other | Admitting: Osteopathic Medicine

## 2017-11-05 ENCOUNTER — Encounter: Payer: Self-pay | Admitting: Osteopathic Medicine

## 2017-11-05 VITALS — BP 112/72 | HR 99 | Temp 98.1°F | Wt 126.0 lb

## 2017-11-05 DIAGNOSIS — R002 Palpitations: Secondary | ICD-10-CM | POA: Diagnosis not present

## 2017-11-05 DIAGNOSIS — N926 Irregular menstruation, unspecified: Secondary | ICD-10-CM | POA: Diagnosis not present

## 2017-11-05 DIAGNOSIS — M797 Fibromyalgia: Secondary | ICD-10-CM

## 2017-11-05 NOTE — Progress Notes (Signed)
HPI: Holly [name] is a 41 y.o. female who  has a past medical history of Depression, Fibromyalgia (2018), GERD (gastroesophageal reflux disease), Hemiplegia (HCC), IBS (irritable bowel syndrome), Migraine, and MRSA (methicillin resistant staph aureus) culture positive.  [facility], 11/05/17,  for chief complaint of:  Chief Complaint  Patient presents with  . Follow-up - pain and anxiety     Recently started new injectable medication for migraine prevention, has been working quite well.  She is still having about 15 migraine days per month but overall this is actually a good bit better for her.  Since starting the Ajovy, the only additional symptoms she has experienced is occasional palpitations.  This has just feels like her heart is beating a little bit too fast, no shortness of breath, no chest pain, no dizziness.  Irregular periods are becoming more bothersome for her.  Positive for family history of early menopause, she would like to have hormone levels checked just to be safe.  Anxiety doing okay on current dose of Zoloft.  We will be getting into see pain management to see for in terms of pain control.  She is still having issues waking up sore, typically helped with heating pads.    Past medical, surgical, social and family history reviewed:  Patient Active Problem List   Diagnosis Date Noted  . Fibromyalgia syndrome 09/30/2017  . Acute pain of left knee 05/27/2017  . 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 Abnormal Pap smear of cervix 02/26/2017  . Depression, recurrent (HCC) 01/09/2017  . Chronic seasonal allergic rhinitis 11/27/2016  . Intractable chronic migraine without aura and with status migrainosus 04/08/2016    Past Surgical History:  Procedure Laterality Date  . WRIST SURGERY     cyst removal    Social History   Tobacco Use  . Smoking status: Never Smoker  . Smokeless tobacco: Never Used  Substance  Use Topics  . Alcohol use: No    Alcohol/week: 0.0 oz    Comment: none    Family History  Problem Relation Age of Onset  . Thyroid disease Mother   . Scoliosis Mother   . Depression Mother   . Migraines Mother   . Hyperlipidemia Mother   . Hypertension Mother   . Heart attack Mother   . Crohn's disease Father   . Autoimmune disease Father   . Factor V Leiden deficiency Father   . Heart failure Father   . Diabetes Father   . Hyperlipidemia Father   . Heart attack Father   . Cancer Sister   . Depression Sister   . Diabetes Sister   . Diabetes Paternal Grandfather      Current medication list and allergy/intolerance information reviewed:    Current Outpatient Medications  Medication Sig Dispense Refill  . butalbital-acetaminophen-caffeine (FIORICET, ESGIC) 50-325-40 MG tablet Take 1 tablet by mouth every 6 (six) hours as needed for headache. 15 tablet 5  . Fremanezumab-vfrm (AJOVY) 225 MG/1.5ML SOSY Inject 225 mg into the skin every 30 (thirty) days. 1 Syringe 12  . HYDROcodone-acetaminophen (NORCO/VICODIN) 5-325 MG tablet One half tablet every 6-8 hours for severe pain. 6 tablet 0  . IBU 800 MG tablet Take 1 tablet (800 mg total) by mouth every 8 (eight) hours as needed for mild pain or moderate pain. 60 tablet 0  . montelukast (SINGULAIR) 10 MG tablet Take 1 tablet (10 mg) by mouth daily in the evening 90 tablet 3  . norethindrone (  MICRONOR,CAMILA,ERRIN) 0.35 MG tablet Take 1 tablet (0.35 mg total) by mouth daily. 28 tablet 5  . omeprazole (PRILOSEC) 20 MG capsule Take 1 capsule (20 mg total) by mouth daily. 90 capsule 3  . prochlorperazine (COMPAZINE) 10 MG tablet Take 10 mg by mouth every 6 (six) hours as needed for migraine.    . QC LORATADINE-D 10-240 MG 24 hr tablet Take 1 tablet by mouth daily. 30 tablet 11  . sertraline (ZOLOFT) 50 MG tablet Take 1 tablet (50 mg) by mouth daily 90 tablet 3  . TROKENDI XR 200 MG CP24 Take 1 capsule by mouth at bedtime. 30 capsule 11    No current facility-administered medications for this visit.     Allergies  Allergen Reactions  . Terbutaline Palpitations  . Sumatriptan   . Adhesive [Tape] Itching and Rash  . Celebrex [Celecoxib] Rash  . Ciprofloxacin Rash  . Zyrtec Allergy [Cetirizine Hcl] Other (See Comments)    Overly sleepy      Review of Systems:  Constitutional:  No  fever, no chills, No recent illness, No unintentional weight changes. +chronic significant fatigue.   HEENT: No  headache, no vision change  Cardiac: No  chest pain  Respiratory:  No  shortness of breath.  Gastrointestinal: No  abdominal pain  Musculoskeletal: No new myalgia/arthralgia  Psychiatric: No  concerns with depression, No  concerns with anxiety, No sleep problems, No mood problems  Exam:  BP 112/72   Pulse 99   Temp 98.1 F (36.7 C)   Wt 126 lb 0.5 oz (57.2 kg)   BMI 20.34 kg/m   Constitutional: VS see above. General Appearance: alert, well-developed, well-nourished, NAD  Neck: No masses, trachea midline.   Respiratory: Normal respiratory effort. no wheeze, no rhonchi, no rales  Cardiovascular: S1/S2 normal, no murmur, no rub/gallop auscultated. RRR.  Musculoskeletal: Gait normal.   Neurological: Normal balance/coordination. No tremor.   Skin: warm, dry, intact.   Psychiatric: Normal judgment/insight. Normal mood and affect. Oriented x3.     ASSESSMENT/PLAN:   Irregular periods - Plan: FSH/LH, Estrogens, total  Palpitations - Patient declines further workup at this time, watchful waiting.  ER/RTC precautions reviewed  Fibromyalgia syndrome - Awaiting pain management referral based on neurology recommendations     Visit summary with medication list and pertinent instructions was printed for patient to review. All questions at time of visit were answered - patient instructed to contact office with any additional concerns. ER/RTC precautions were reviewed with the patient.   Follow-up plan: Return  for annual physical 12/2017 .  Note: Total time spent 25 minutes, greater than 50% of the visit was spent face-to-face counseling and coordinating care for the following: The primary encounter diagnosis was Irregular periods. Diagnoses of Palpitations and Fibromyalgia syndrome were also pertinent to this visit.Marland Kitchen.  Please note: voice recognition software was used to produce this document, and typos may escape review. Please contact Dr. Lyn HollingsheadAlexander for any needed clarifications.

## 2017-11-09 LAB — ESTROGENS, TOTAL: Estrogen: 453.1 pg/mL

## 2017-11-09 LAB — FSH/LH
FSH: 5.3 m[IU]/mL
LH: 5.3 m[IU]/mL

## 2017-11-18 ENCOUNTER — Encounter: Payer: Self-pay | Admitting: Osteopathic Medicine

## 2017-11-20 ENCOUNTER — Telehealth: Payer: Self-pay

## 2017-11-20 NOTE — Telephone Encounter (Signed)
[facility] Pharmacy requesting refills. Pls advise.

## 2017-11-21 MED ORDER — PROCHLORPERAZINE MALEATE 10 MG PO TABS: 10.0000 mg | ORAL_TABLET | Freq: Four times a day (QID) | ORAL | 5 refills | Status: DC | PRN

## 2017-11-21 NOTE — Telephone Encounter (Signed)
My apologies - requested refill is for compazine medication (filled by another provider).

## 2017-11-21 NOTE — Telephone Encounter (Signed)
Please clarify; which meds are needing refilled?

## 2017-11-21 NOTE — Telephone Encounter (Signed)
Sent!

## 2017-12-03 ENCOUNTER — Encounter: Payer: Self-pay | Admitting: Neurology

## 2017-12-05 ENCOUNTER — Encounter: Payer: Self-pay | Admitting: Osteopathic Medicine

## 2017-12-05 MED ORDER — ERENUMAB-AOOE 70 MG/ML ~~LOC~~ SOAJ: 1.0000 | SUBCUTANEOUS | 11 refills | Status: DC

## 2017-12-10 ENCOUNTER — Other Ambulatory Visit: Payer: Self-pay

## 2017-12-10 ENCOUNTER — Emergency Department (INDEPENDENT_AMBULATORY_CARE_PROVIDER_SITE_OTHER)
Admission: EM | Admit: 2017-12-10 | Discharge: 2017-12-10 | Disposition: A | Discharge status: Home / Self Care | Payer: Medicare Other | Source: Home / Self Care | Attending: Family Medicine | Admitting: Family Medicine

## 2017-12-10 DIAGNOSIS — S01332A Puncture wound without foreign body of left ear, initial encounter: Secondary | ICD-10-CM

## 2017-12-10 DIAGNOSIS — S01331A Puncture wound without foreign body of right ear, initial encounter: Secondary | ICD-10-CM

## 2017-12-10 DIAGNOSIS — L089 Local infection of the skin and subcutaneous tissue, unspecified: Secondary | ICD-10-CM

## 2017-12-10 MED ORDER — CEPHALEXIN 500 MG PO CAPS: 500.0000 mg | ORAL_CAPSULE | Freq: Two times a day (BID) | ORAL | 0 refills | Status: DC

## 2017-12-10 NOTE — ED Provider Notes (Signed)
[facility]    CSN: [id number] Arrival date & time: 12/10/17  1153     History   Chief Complaint Chief Complaint  Patient presents with  . Ear Drainage    HPI Holly [name] is a 42 y.o. female.   HPI Holly [name] is a 42 y.o. female presenting to UC with c/o bilateral ear pain and redness from where she had 2 metal posts pierced through the helicis crus on each ear to help treat her migraines.  The piercings were done about 2 weeks ago.  The one on the Right healed within 3 days but the one on the left has never fully healed.  She had been cleaning with saline as recommended by the tattoo parlor but then started to use peroxide and neosporin when she developed itching, pain and swelling.  Yesterday, she noticed a yellowish pus, worse out of the Let ear.  Pain is aching and sore.  She reports some allergies to metals but tries to pick those she does not have a reaction to.  She takes cetirizine in the morning for allergies but has not tried anything else for the itching.  Past Medical History:  Diagnosis Date  . Depression   . Fibromyalgia 2018  . GERD (gastroesophageal reflux disease)   . Hemiplegia (HCC)   . IBS (irritable bowel syndrome)   . Migraine   . MRSA (methicillin resistant staph aureus) culture positive     Patient Active Problem List   Diagnosis Date Noted  . Fibromyalgia syndrome 09/30/2017  . Acute pain of left knee 05/27/2017  . 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 Abnormal Pap smear of cervix 02/26/2017  . Depression, recurrent (HCC) 01/09/2017  . Chronic seasonal allergic rhinitis 11/27/2016  . Intractable chronic migraine without aura and with status migrainosus 04/08/2016    Past Surgical History:  Procedure Laterality Date  . WRIST SURGERY     cyst removal    OB History    Gravida Para Term Preterm AB Living   6 6 2 4   6    SAB TAB Ectopic Multiple Live Births         0 6       Home Medications    Prior to Admission  medications   Medication Sig Start Date End Date Taking? Authorizing Provider  butalbital-acetaminophen-caffeine (FIORICET, ESGIC) [phone] MG tablet Take 1 tablet by mouth every 6 (six) hours as needed for headache. 02/25/17   Anson Fret, MD  cephALEXin (KEFLEX) 500 MG capsule Take 1 capsule (500 mg total) by mouth 2 (two) times daily. 12/10/17   Lurene Shadow, PA-C  Erenumab-aooe (AIMOVIG) 70 MG/ML SOAJ Inject 1 Syringe into the skin every 30 (thirty) days. 12/05/17   Anson Fret, MD  HYDROcodone-acetaminophen (NORCO/VICODIN) 5-325 MG tablet One half tablet every 6-8 hours for severe pain. 09/18/17   Collene Gobble, MD  IBU 800 MG tablet Take 1 tablet (800 mg total) by mouth every 8 (eight) hours as needed for mild pain or moderate pain. 10/14/17   Sunnie Nielsen, DO  montelukast (SINGULAIR) 10 MG tablet Take 1 tablet (10 mg) by mouth daily in the evening 06/19/17   Sunnie Nielsen, DO  norethindrone (MICRONOR,CAMILA,ERRIN) 0.35 MG tablet Take 1 tablet (0.35 mg total) by mouth daily. 10/04/17   Sunnie Nielsen, DO  omeprazole (PRILOSEC) 20 MG capsule Take 1 capsule (20 mg total) by mouth daily. 09/11/17   Sunnie Nielsen, DO  prochlorperazine (COMPAZINE) 10 MG tablet Take 1  tablet (10 mg total) by mouth every 6 (six) hours as needed. 11/21/17   Sunnie NielsenAlexander, Natalie, DO  QC LORATADINE-D 10-240 MG 24 hr tablet Take 1 tablet by mouth daily. 10/14/17   Sunnie NielsenAlexander, Natalie, DO  sertraline (ZOLOFT) 50 MG tablet Take 1 tablet (50 mg) by mouth daily 06/19/17   Sunnie NielsenAlexander, Natalie, DO  TROKENDI XR 200 MG CP24 Take 1 capsule by mouth at bedtime. 04/08/17   Anson FretAhern, Antonia B, MD    Family History Family History  Problem Relation Age of Onset  . Thyroid disease Mother   . Scoliosis Mother   . Depression Mother   . Migraines Mother   . Hyperlipidemia Mother   . Hypertension Mother   . Heart attack Mother   . Crohn's disease Father   . Autoimmune disease Father   . Factor V Leiden  deficiency Father   . Heart failure Father   . Diabetes Father   . Hyperlipidemia Father   . Heart attack Father   . Cancer Sister   . Depression Sister   . Diabetes Sister   . Diabetes Paternal Grandfather     Social History Social History   Tobacco Use  . Smoking status: Never Smoker  . Smokeless tobacco: Never Used  Substance Use Topics  . Alcohol use: No    Alcohol/week: 0.0 oz    Comment: none  . Drug use: No     Allergies   Terbutaline; Sumatriptan; Adhesive [tape]; Celebrex [celecoxib]; Ciprofloxacin; and Zyrtec allergy [cetirizine hcl]   Review of Systems Review of Systems  Constitutional: Negative for chills and fever.  Skin: Positive for color change. Negative for rash and wound.  Neurological: Negative for numbness and headaches.     Physical Exam Triage Vital Signs ED Triage Vitals  Enc Vitals Group     BP 12/10/17 1226 119/84     Pulse Rate 12/10/17 1226 85     Resp --      Temp 12/10/17 1226 98.3 F (36.8 C)     Temp Source 12/10/17 1226 Oral     SpO2 12/10/17 1226 100 %     Weight 12/10/17 1227 126 lb (57.2 kg)     Height 12/10/17 1227 5\' 6"  (1.676 m)     Head Circumference --      Peak Flow --      Pain Score 12/10/17 1226 5     Pain Loc --      Pain Edu? --      Excl. in GC? --    No data found.  Updated Vital Signs BP 119/84 (BP Location: Right Arm)   Pulse 85   Temp 98.3 F (36.8 C) (Oral)   Ht 5\' 6"  (1.676 m)   Wt 126 lb (57.2 kg)   LMP 12/05/2017   SpO2 100%   BMI 20.34 kg/m   Visual Acuity Right Eye Distance:   Left Eye Distance:   Bilateral Distance:    Right Eye Near:   Left Eye Near:    Bilateral Near:     Physical Exam  Constitutional: She appears well-developed and well-nourished. No distress.  HENT:  Head: Normocephalic.  Right Ear: There is swelling and tenderness.  Ears:  Silver posts in helicis crus on both ears.  Scant yellow crusting discharge, worse on Left side. Mild erythema and edema with  tenderness.   Skin: She is not diaphoretic.  Nursing note and vitals reviewed.    UC Treatments / Results  Labs (all labs ordered are  listed, but only abnormal results are displayed) Labs Reviewed - No data to display  EKG  EKG Interpretation None       Radiology No results found.  Procedures Procedures (including critical care time)  Medications Ordered in UC Medications - No data to display   Initial Impression / Assessment and Plan / UC Course  I have reviewed the triage vital signs and the nursing notes.  Pertinent labs & imaging results that were available during my care of the patient were reviewed by me and considered in my medical decision making (see chart for details).     Hx and exam c/w secondary early skin infection, likely due to scratching from itching caused by local allergic reaction. Encouraged pt to return to tattoo parlor to exchange metal posts, if unable to exchange them, encouraged to remove completely to help with healing and reduce skin irritation F/u with PCP in 5-7 days for recheck of symptoms after taking Keflex, sooner if worsening.   Final Clinical Impressions(s) / UC Diagnoses   Final diagnoses:  Infected pierced ear, left, initial encounter  Infected pierced ear, right, initial encounter    ED Discharge Orders        Ordered    cephALEXin (KEFLEX) 500 MG capsule  2 times daily     12/10/17 1254       Controlled Substance Prescriptions [place] Controlled Substance Registry consulted? Not Applicable   Rolla Plate 12/10/17 1542

## 2017-12-10 NOTE — ED Triage Notes (Signed)
Pt had ear piercing in both ears about 2 weeks ago.  The left has been more painful than the right, and drained yellowish puss yesterday.  Now the right is hurting worse.

## 2017-12-10 NOTE — Discharge Instructions (Signed)
°  It would be best for your skin/ears if you could remove your earrings or see if the tattoo parlor can replace with a different material as your skin is likely having an allergic reaction to the medal, resulting in the inflammation, redness and itching.  It does appear you have a mild secondary skin infection. Please keep piercings clean with warm water and mild soap. Pat dry.  Take the cephalexin (Keflex) as prescribed.

## 2017-12-11 ENCOUNTER — Telehealth: Payer: Self-pay | Admitting: *Deleted

## 2017-12-11 NOTE — Telephone Encounter (Signed)
Pt called reports that she dropped her ABT in the bathtub this morning. She was able to salvage 4 of the pills. I advised her that I will call in 10 additional Keflex to tablets to [facility].

## 2017-12-30 ENCOUNTER — Encounter: Payer: Self-pay | Admitting: Neurology

## 2018-01-03 ENCOUNTER — Other Ambulatory Visit: Payer: Self-pay | Admitting: Neurology

## 2018-01-04 ENCOUNTER — Emergency Department (INDEPENDENT_AMBULATORY_CARE_PROVIDER_SITE_OTHER)
Admission: EM | Admit: 2018-01-04 | Discharge: 2018-01-04 | Disposition: A | Discharge status: Home / Self Care | Payer: Self-pay | Source: Home / Self Care | Attending: Family Medicine | Admitting: Family Medicine

## 2018-01-04 ENCOUNTER — Encounter: Payer: Self-pay | Admitting: Emergency Medicine

## 2018-01-04 DIAGNOSIS — M545 Low back pain, unspecified: Secondary | ICD-10-CM

## 2018-01-04 DIAGNOSIS — S76012A Strain of muscle, fascia and tendon of left hip, initial encounter: Secondary | ICD-10-CM

## 2018-01-04 MED ORDER — CYCLOBENZAPRINE HCL 10 MG PO TABS: ORAL_TABLET | ORAL | 0 refills | Status: DC

## 2018-01-04 MED ORDER — IBUPROFEN 800 MG PO TABS: 800.0000 mg | ORAL_TABLET | Freq: Three times a day (TID) | ORAL | 0 refills | Status: DC | PRN
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
## 2018-01-04 NOTE — Discharge Instructions (Signed)
Apply ice pack for 20 to 30 minutes, 3 to 4 times daily  Continue until pain and swelling decrease.  Begin range of motion and stretching exercises as tolerated. 
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
## 2018-01-06 ENCOUNTER — Encounter: Payer: Medicare Other | Admitting: Osteopathic Medicine

## 2018-01-06 ENCOUNTER — Telehealth: Payer: Self-pay | Admitting: *Deleted

## 2018-01-06 NOTE — Telephone Encounter (Signed)
Spoke with patient. She requested that refill of Fioricet be faxed to [facility]. Also discussed that per her discussion with Dr. Lucia GaskinsAhern, pt will be starting Emgality. She will come to the office on Thursday between 9 and 10 for a nurse visit for her loading dose of Emgality.

## 2018-01-07 ENCOUNTER — Other Ambulatory Visit (HOSPITAL_COMMUNITY)
Admission: RE | Admit: 2018-01-07 | Discharge: 2018-01-07 | Disposition: A | Discharge status: Home / Self Care | Payer: Medicare Other | Source: Ambulatory Visit | Attending: Osteopathic Medicine | Admitting: Osteopathic Medicine

## 2018-01-07 ENCOUNTER — Encounter: Payer: Self-pay | Admitting: Osteopathic Medicine

## 2018-01-07 ENCOUNTER — Ambulatory Visit (INDEPENDENT_AMBULATORY_CARE_PROVIDER_SITE_OTHER): Payer: Medicare Other | Admitting: Osteopathic Medicine

## 2018-01-07 VITALS — BP 117/70 | HR 88 | Temp 98.2°F | Wt 127.1 lb

## 2018-01-07 DIAGNOSIS — Z124 Encounter for screening for malignant neoplasm of cervix: Secondary | ICD-10-CM | POA: Diagnosis not present

## 2018-01-07 DIAGNOSIS — N926 Irregular menstruation, unspecified: Secondary | ICD-10-CM

## 2018-01-07 DIAGNOSIS — R8789 Other abnormal findings in specimens from female genital organs: Secondary | ICD-10-CM | POA: Insufficient documentation

## 2018-01-07 DIAGNOSIS — Z01419 Encounter for gynecological examination (general) (routine) without abnormal findings: Secondary | ICD-10-CM | POA: Diagnosis not present

## 2018-01-07 DIAGNOSIS — Z0000 Encounter for general adult medical examination without abnormal findings: Secondary | ICD-10-CM

## 2018-01-07 DIAGNOSIS — R87618 Other abnormal cytological findings on specimens from cervix uteri: Secondary | ICD-10-CM

## 2018-01-07 NOTE — Progress Notes (Signed)
HPI: Holly [name] is a 42 y.o. female who  has a past medical history of Depression, Fibromyalgia (2018), GERD (gastroesophageal reflux disease), Hemiplegia (HCC), IBS (irritable bowel syndrome), Migraine, and MRSA (methicillin resistant staph aureus) culture positive.  she presents to [facility] today, 01/07/18,  for chief complaint of: Annual Physical   We are following up on Abn pap  No other concerns today Last annual just under a year ago.  Irregular/painful periods are getting to be more of an issue, previous OBGYN no longer in her insurance network.    Past medical, surgical, social and family history reviewed:  Patient Active Problem List   Diagnosis Date Noted  . Fibromyalgia syndrome 09/30/2017  . Acute pain of left knee 05/27/2017  . 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 Abnormal Pap smear of cervix 02/26/2017  . Depression, recurrent (HCC) 01/09/2017  . Chronic seasonal allergic rhinitis 11/27/2016  . Intractable chronic migraine without aura and with status migrainosus 04/08/2016    Past Surgical History:  Procedure Laterality Date  . WRIST SURGERY     cyst removal    Social History   Tobacco Use  . Smoking status: Never Smoker  . Smokeless tobacco: Never Used  Substance Use Topics  . Alcohol use: No    Alcohol/week: 0.0 oz    Comment: none    Family History  Problem Relation Age of Onset  . Thyroid disease Mother   . Scoliosis Mother   . Depression Mother   . Migraines Mother   . Hyperlipidemia Mother   . Hypertension Mother   . Heart attack Mother   . Crohn's disease Father   . Autoimmune disease Father   . Factor V Leiden deficiency Father   . Heart failure Father   . Diabetes Father   . Hyperlipidemia Father   . Heart attack Father   . Cancer Sister   . Depression Sister   . Diabetes Sister   . Diabetes Paternal Grandfather      Current medication list and allergy/intolerance information reviewed:     Current Outpatient Medications  Medication Sig Dispense Refill  . butalbital-acetaminophen-caffeine (FIORICET, ESGIC) 50-325-40 MG tablet TAKE 1 TABLET BY MOUTH EVERY 6 HOURS AS NEEDED FOR HEADACHE 15 tablet 1  . cyclobenzaprine (FLEXERIL) 10 MG tablet Take one tab by mouth at bedtime for muscle spasm 12 tablet 0  . Erenumab-aooe (AIMOVIG) 70 MG/ML SOAJ Inject 1 Syringe into the skin every 30 (thirty) days. 1 pen 11  . ibuprofen (IBU) 800 MG tablet Take 1 tablet (800 mg total) by mouth every 8 (eight) hours as needed for mild pain or moderate pain. 30 tablet 0  . montelukast (SINGULAIR) 10 MG tablet Take 1 tablet (10 mg) by mouth daily in the evening 90 tablet 3  . norethindrone (MICRONOR,CAMILA,ERRIN) 0.35 MG tablet Take 1 tablet (0.35 mg total) by mouth daily. 28 tablet 5  . omeprazole (PRILOSEC) 20 MG capsule Take 1 capsule (20 mg total) by mouth daily. 90 capsule 3  . prochlorperazine (COMPAZINE) 10 MG tablet Take 1 tablet (10 mg total) by mouth every 6 (six) hours as needed. 30 tablet 5  . QC LORATADINE-D 10-240 MG 24 hr tablet Take 1 tablet by mouth daily. 30 tablet 11  . sertraline (ZOLOFT) 50 MG tablet Take 1 tablet (50 mg) by mouth daily 90 tablet 3  . TROKENDI XR 200 MG CP24 Take 1 capsule by mouth at bedtime. 30 capsule 11  . cephALEXin (KEFLEX) 500 MG  capsule Take 1 capsule (500 mg total) by mouth 2 (two) times daily. (Patient not taking: Reported on 01/07/2018) 14 capsule 0  . HYDROcodone-acetaminophen (NORCO/VICODIN) 5-325 MG tablet One half tablet every 6-8 hours for severe pain. (Patient not taking: Reported on 01/07/2018) 6 tablet 0   No current facility-administered medications for this visit.     Allergies  Allergen Reactions  . Terbutaline Palpitations  . Sumatriptan   . Adhesive [Tape] Itching and Rash  . Celebrex [Celecoxib] Rash  . Ciprofloxacin Rash  . Zyrtec Allergy [Cetirizine Hcl] Other (See Comments)    Overly sleepy      Review of  Systems:  Constitutional:  No  fever, no chills, No recent illness, No unintentional weight changes. No significant fatigue.   HEENT: No +chronic headache, no vision change, no hearing change  Cardiac: No  chest pain, No  pressure, No palpitations  Respiratory:  No  shortness of breath. No  Cough  Gastrointestinal: No  abdominal pain, No  nausea, No  vomiting  Musculoskeletal: No new myalgia/arthralgia  Skin: No  Rash  Genitourinary: No  incontinence, No  abnormal genital bleeding, No abnormal genital discharge  Neurologic: No  weakness, No  dizziness  Psychiatric: No  concerns with depression, No  concerns with anxiety  Exam:  BP 117/70   Pulse 88   Temp 98.2 F (36.8 C) (Oral)   Wt 127 lb 1.3 oz (57.6 kg)   LMP 12/27/2017 (Exact Date)   BMI 20.51 kg/m    Constitutional: VS see above. General Appearance: alert, well-developed, well-nourished, NAD  Eyes: Normal lids and conjunctive, non-icteric sclera  Ears, Nose, Mouth, Throat: MMM, Normal external inspection ears/nares/mouth/lips/gums.  Neck: No masses, trachea midline. No thyroid enlargement.   Respiratory: Normal respiratory effort. no wheeze, no rhonchi, no rales  Cardiovascular: S1/S2 normal, no murmur, no rub/gallop auscultated. RRR. No lower extremity edema.   Gastrointestinal: Nontender, no masses. No hepatomegaly, no splenomegaly.   Musculoskeletal: Gait normal. No clubbing/cyanosis of digits.   Neurological: Normal balance/coordination. No tremor. No cranial nerve deficit on limited exam.   Skin: warm, dry, intact. No rash/ulcer. No concerning nevi or subq nodules on limited exam.    Psychiatric: Normal judgment/insight. Normal mood and affect. Oriented x3.  GYN: No lesions/ulcers to external genitalia, normal urethra, normal vaginal mucosa, physiologic discharge, cervix friable with few cysts, uterus not enlarged or tender, adnexa no masses and nontender  BREAST: No rashes/skin changes, normal  fibrous breast tissue, no masses or tenderness, normal nipple without discharge, normal axilla       ASSESSMENT/PLAN:   Annual physical exam - Plan: CBC, COMPLETE METABOLIC PANEL WITH GFR, Lipid panel, TSH, VITAMIN D 25 Hydroxy (Vit-D Deficiency, Fractures)  Irregular periods/menstrual cycles - Plan: Ambulatory referral to Obstetrics / Gynecology  Cervical cancer screening - Plan: Cytology - PAP  Pap smear abnormality of cervix/human papillomavirus (HPV) positive - Plan: Cytology - PAP    FEMALE PREVENTIVE CARE Updated 01/07/18   ANNUAL SCREENING/COUNSELING  Diet/Exercise - HEALTHY HABITS DISCUSSED TO DECREASE CV RISK Social History   Tobacco Use  Smoking Status Never Smoker  Smokeless Tobacco Never Used   Social History   Substance and Sexual Activity  Alcohol Use No  . Alcohol/week: 0.0 oz   Comment: none   Depression screen PHQ 2/9 07/05/2017  Decreased Interest 1  Down, Depressed, Hopeless 0  PHQ - 2 Score 1  Altered sleeping -  Tired, decreased energy -  Change in appetite -  Feeling bad or failure  about yourself  -  Trouble concentrating -  Moving slowly or fidgety/restless -  Suicidal thoughts -  PHQ-9 Score -    Domestic violence concerns - no  HTN SCREENING - SEE VITALS  SEXUAL HEALTH  Sexually active in the past year - Yes with female.  Need/want STI testing today? - no  Concerns about libido or pain with sex? - yes  Plans for pregnancy? - none  INFECTIOUS DISEASE SCREENING  HIV - does not need  GC/CT - does not need  HepC - DOB [birth year] - does not need  TB - does not need  DISEASE SCREENING  Lipid - needs  DM2 - needs  Osteoporosis - women age 4+ - does not need  CANCER SCREENING  Cervical - needs - last pap (+)HPV 1 year ago, negative for high risk, we opted to repeat cytology and HPV testing in one year, referring to OB, may need colpo   Breast - does not need  Lung - does not need  Colon - does not need  ADULT  VACCINATION  Influenza - annual vaccine recommended  Td - booster every 10 years   Zoster - Shingrix recommended 50+  PCV13 - was not indicated  PPSV23 - was not indicated Immunization History  Administered Date(s) Administered  . Influenza-Unspecified 08/19/2016, 09/02/2016  . Tdap 08/14/2009, 05/02/2015    OTHER  Fall - exercise and Vit D age 4+ - does not need       Visit summary with medication list and pertinent instructions was printed for patient to review. All questions at time of visit were answered - patient instructed to contact office with any additional concerns. ER/RTC precautions were reviewed with the patient.   Follow-up plan: Return in about 1 year (around 01/07/2019) for annual physical, sooner if needed .   Please note: voice recognition software was used to produce this document, and typos may escape review. Please contact Dr. Lyn HollingsheadAlexander for any needed clarifications.

## 2018-01-09 ENCOUNTER — Ambulatory Visit (INDEPENDENT_AMBULATORY_CARE_PROVIDER_SITE_OTHER): Payer: Medicare Other | Admitting: *Deleted

## 2018-01-09 DIAGNOSIS — Z7189 Other specified counseling: Secondary | ICD-10-CM

## 2018-01-09 DIAGNOSIS — G43401 Hemiplegic migraine, not intractable, with status migrainosus: Secondary | ICD-10-CM | POA: Diagnosis not present

## 2018-01-09 LAB — CYTOLOGY - PAP
HPV 16/18/45 genotyping: NEGATIVE
HPV: DETECTED — AB

## 2018-01-09 MED ORDER — GALCANEZUMAB-GNLM 120 MG/ML ~~LOC~~ SOAJ: 240.0000 mg | Freq: Once | SUBCUTANEOUS | 0 refills | Status: AC

## 2018-01-09 MED ORDER — GALCANEZUMAB-GNLM 120 MG/ML ~~LOC~~ SOAJ: 120.0000 mg | SUBCUTANEOUS | 11 refills | Status: DC

## 2018-01-09 NOTE — Progress Notes (Signed)
Patient arrived for her loading dose of Emgality 240 mg subcutaneous. She was familiarized with the demonstration pen and then RN injected patient with 2 sample pens each 120 mg. Patient tolerated injection well, bandaids applied.  Patient is aware that following doses will start in 30 days and will be 120 mg (1 pen) every 30 days. She will refrigerate the doses and then leave out at room temperature for 30 minutes prior to injection. She was given a second box of 2 pens to take home for future doses. Patient was given instruction sheets from the box. The patient is aware that injection is subcutaneous and should be given every 30 days. Pens should remain refrigerated until ready for use. Let the pen sit at room temperature for 30 minutes prior to injection. The patient was also educated on the available sites to rotate each month (abdomen, thigh, buttock and back of upper arm). RN discussed proper technique for skin prep prior to injection including to wash hands, cleanse the site inside to outside in a circular motion and allow skin to air dry. The patient was instructed how unlock device, twist cap off and place against skin. Press green injector button on top of pen and hold until 2nd click indicating all medication has been injected. Immediately place the used pen in the sharps container and do not touch. RN administered medication today in the  R & L buttock per pt request. The patient tolerated well and verbalized understanding of instructions.  She was also given an updated medication list and [facility]

## 2018-01-13 ENCOUNTER — Telehealth: Payer: Self-pay | Admitting: *Deleted

## 2018-01-13 NOTE — Telephone Encounter (Signed)
Attempted to complete PA on Cover My Meds. Unable to complete PA due to documentation that [facility] already has a denial on file for Emgality 120 mg.

## 2018-01-20 ENCOUNTER — Ambulatory Visit (INDEPENDENT_AMBULATORY_CARE_PROVIDER_SITE_OTHER): Payer: Medicare Other | Admitting: Obstetrics & Gynecology

## 2018-01-20 ENCOUNTER — Encounter: Payer: Self-pay | Admitting: Obstetrics & Gynecology

## 2018-01-20 VITALS — BP 110/72 | HR 88 | Resp 16 | Ht 67.0 in | Wt 127.0 lb

## 2018-01-20 DIAGNOSIS — Z01812 Encounter for preprocedural laboratory examination: Secondary | ICD-10-CM

## 2018-01-20 DIAGNOSIS — N921 Excessive and frequent menstruation with irregular cycle: Secondary | ICD-10-CM | POA: Diagnosis not present

## 2018-01-20 DIAGNOSIS — N870 Mild cervical dysplasia: Secondary | ICD-10-CM | POA: Diagnosis not present

## 2018-01-20 DIAGNOSIS — R87610 Atypical squamous cells of undetermined significance on cytologic smear of cervix (ASC-US): Secondary | ICD-10-CM

## 2018-01-20 DIAGNOSIS — Z3202 Encounter for pregnancy test, result negative: Secondary | ICD-10-CM

## 2018-01-20 DIAGNOSIS — R87810 Cervical high risk human papillomavirus (HPV) DNA test positive: Secondary | ICD-10-CM | POA: Diagnosis not present

## 2018-01-20 DIAGNOSIS — N946 Dysmenorrhea, unspecified: Secondary | ICD-10-CM

## 2018-01-20 LAB — POCT URINE PREGNANCY: Preg Test, Ur: NEGATIVE

## 2018-01-21 ENCOUNTER — Ambulatory Visit (INDEPENDENT_AMBULATORY_CARE_PROVIDER_SITE_OTHER): Payer: Medicare Other

## 2018-01-21 DIAGNOSIS — N921 Excessive and frequent menstruation with irregular cycle: Secondary | ICD-10-CM

## 2018-01-21 DIAGNOSIS — Z0000 Encounter for general adult medical examination without abnormal findings: Secondary | ICD-10-CM | POA: Diagnosis not present

## 2018-01-21 DIAGNOSIS — N946 Dysmenorrhea, unspecified: Secondary | ICD-10-CM | POA: Diagnosis not present

## 2018-01-22 LAB — LIPID PANEL
Cholesterol: 109 mg/dL (ref <200)
HDL: 43 mg/dL — ABNORMAL LOW (ref >50)
LDL Cholesterol (Calc): 53 mg/dL (calc)
Non-HDL Cholesterol (Calc): 66 mg/dL (calc) (ref <130)
Total CHOL/HDL Ratio: 2.5 (calc) (ref <5.0)
Triglycerides: 51 mg/dL (ref <150)

## 2018-01-22 LAB — COMPLETE METABOLIC PANEL WITH GFR
AG Ratio: 1.8 (calc) (ref 1.0–2.5)
ALT: 6 U/L (ref 6–29)
AST: 11 U/L (ref 10–30)
Albumin: 4.3 g/dL (ref 3.6–5.1)
Alkaline phosphatase (APISO): 45 U/L (ref 33–115)
BUN: 9 mg/dL (ref 7–25)
CO2: 24 mmol/L (ref 20–32)
Calcium: 8.9 mg/dL (ref 8.6–10.2)
Chloride: 111 mmol/L — ABNORMAL HIGH (ref 98–110)
Creat: 1.02 mg/dL (ref 0.50–1.10)
GFR, Est African American: 80 mL/min/{1.73_m2} (ref >=60)
GFR, Est Non African American: 69 mL/min/{1.73_m2} (ref >=60)
Globulin: 2.4 g/dL (calc) (ref 1.9–3.7)
Glucose, Bld: 94 mg/dL (ref 65–99)
Potassium: 4.1 mmol/L (ref 3.5–5.3)
Sodium: 140 mmol/L (ref 135–146)
Total Bilirubin: 0.5 mg/dL (ref 0.2–1.2)
Total Protein: 6.7 g/dL (ref 6.1–8.1)

## 2018-01-22 LAB — CBC
HCT: 35.6 % (ref 35.0–45.0)
Hemoglobin: 12.5 g/dL (ref 11.7–15.5)
MCH: 31.6 pg (ref 27.0–33.0)
MCHC: 35.1 g/dL (ref 32.0–36.0)
MCV: 90.1 fL (ref 80.0–100.0)
MPV: 8.8 fL (ref 7.5–12.5)
Platelets: 209 10*3/uL (ref 140–400)
RBC: 3.95 10*6/uL (ref 3.80–5.10)
RDW: 12.6 % (ref 11.0–15.0)
WBC: 5.3 10*3/uL (ref 3.8–10.8)

## 2018-01-22 LAB — TSH: TSH: 1.4 mIU/L

## 2018-01-22 LAB — VITAMIN D 25 HYDROXY (VIT D DEFICIENCY, FRACTURES): Vit D, 25-Hydroxy: 21 ng/mL — ABNORMAL LOW (ref 30–100)

## 2018-01-22 NOTE — Telephone Encounter (Signed)
So had she tried it in the past with a different doctor? Or did we fill out the PA?

## 2018-01-23 NOTE — Telephone Encounter (Addendum)
Spoke with patient and she stated that Dr. Luberta Robertsonrowell saw her once and tried Emgality in January (was denied) before she tried the Aimovig (tried for one month, had rash and tight chest).   Called [facility] re: Emgality denial. Spoke with Byrd HesselbachMaria, representative. Discussed that denial for Emgality was due to pt needing to try Aimovig first. RN stated that pt had reaction to Aimovig. Appeal will need to be done. Byrd HesselbachMaria will fax the information to the office.   Appeals fax number: [phone]  Spoke with pt. She is doing fine on the Emgality so far. She is aware that an appeal will be done.

## 2018-01-24 NOTE — Progress Notes (Signed)
Colposcopy Procedure Note  Indications: Pap smear 1 months ago showed: ASC cannot exclude high grade lesion [facility]). The prior pap showed nml cytology and +HR HPV.  Prior cervical/vaginal disease: Procedure Details  The risks and benefits of the procedure and Written informed consent obtained. Pt has had [facility] before ([facility], but no LEEP or Cryo.)  Speculum placed in vagina and excellent visualization of cervix achieved, cervix swabbed x 3 with acetic acid solution.  Findings: Cervix: acetowhite lesion(s) noted at 1 o'clock; 10 'oclock; large T zone; inflammation cervix swabbed with Lugol's solution, SCJ visualized - lesion at 1 & 10 o'clock, endocervical curettage performed, cervical biopsies taken at 1, 10, & 12 o'clock, specimen labelled and sent to pathology and hemostasis achieved with Monsel's solution. Vaginal inspection: vaginal colposcopy not performed. Vulvar colposcopy: vulvar colposcopy not performed.  Specimens: 3 biopsies (as above) and ECC  Complications: none.  Plan: Specimens labelled and sent to Pathology. Will base further treatment on Pathology findings.

## 2018-01-24 NOTE — Progress Notes (Signed)
   Subjective:    Patient ID: Holly [name], female    DOB: [birth year], 42 y.o.   MRN: [id number]  HPI  Pt is new to the practice.  Pt here for pap smear ASC-H.  Pt also complaining of heavy and painful periods.  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  The periods are worse than labor.  [facility] see [facility] but they don't take Medicaid, her new insurance.  Pt has severe hemiplegic migraines.  Estrogen is contraindicated. Pt is on micronor currently.  Pt also c/o more frequent meses than before.     Review of Systems  Constitutional: Negative.   Respiratory: Negative.   Cardiovascular: Negative.   Gastrointestinal: Negative.   Genitourinary: Positive for menstrual problem and vaginal bleeding. Negative for pelvic pain.       Objective:   Physical Exam  Constitutional: She is oriented to person, place, and time. She appears well-developed and well-nourished. No distress.  HENT:  Head: Normocephalic and atraumatic.  Eyes: Conjunctivae are normal.  Cardiovascular: Normal rate.  Pulmonary/Chest: Effort normal.  Abdominal: Soft. She exhibits no distension and no mass. There is no tenderness. There is no rebound and no guarding.  Genitourinary:  Genitourinary Comments: Vulvar:  No lesion Vagina:  Pink normal rugae Uterus  Small, anteverted Adnexa:  NO masses, non tender.  Musculoskeletal: She exhibits no edema.  Neurological: She is alert and oriented to person, place, and time.  Skin: Skin is warm and dry.  Psychiatric: She has a normal mood and affect.  Vitals reviewed.  Vitals:   01/20/18 1302  BP: 110/72  Pulse: 88  Resp: 16  Weight: 127 lb (57.6 kg)  Height: 5\' 7"  (1.702 m)      Assessment & Plan:  42 yo female with ASC-H and dysmenorrhea  1-Colpo (see note) 2-TVUS and get records from [facility]

## 2018-01-27 ENCOUNTER — Encounter: Payer: Self-pay | Admitting: Obstetrics & Gynecology

## 2018-01-29 NOTE — Telephone Encounter (Signed)
Melissa with cover my meds called giving a call back number for any questions dealing with the appeal [phone] reference key [id number]

## 2018-01-30 ENCOUNTER — Encounter: Payer: Self-pay | Admitting: *Deleted

## 2018-01-30 ENCOUNTER — Telehealth: Payer: Self-pay | Admitting: Obstetrics & Gynecology

## 2018-01-30 NOTE — Telephone Encounter (Addendum)
Pt ph [phone] CALL  FOR RESULTS PLEASE. Pt wants update if Penne LashLeggett has received the records for comparison. Pt says can also MYCHART MSG her.

## 2018-01-31 ENCOUNTER — Encounter: Payer: Self-pay | Admitting: *Deleted

## 2018-01-31 NOTE — Telephone Encounter (Signed)
Emgality Appeal letter written and ready for MD signature and review. Also included copies of office notes for support.

## 2018-02-03 NOTE — Telephone Encounter (Signed)
Error

## 2018-02-03 NOTE — Telephone Encounter (Signed)
Faxed signed appeal letter & office notes to [employer] [facility] appeals. Received a receipt of confirmation.

## 2018-02-04 NOTE — Telephone Encounter (Signed)
error 

## 2018-02-05 NOTE — Telephone Encounter (Addendum)
Received fax- Emgality 120 mg/mL has been APPROVED through 11/18/2018.   Spoke with pt and informed her of the approval. She was very Adult nurseappreciative. Pt still has samples, next dose due 02/06/18.   Called pharmacy, they are aware.

## 2018-02-12 ENCOUNTER — Encounter: Payer: Self-pay | Admitting: Obstetrics & Gynecology

## 2018-02-12 ENCOUNTER — Ambulatory Visit (INDEPENDENT_AMBULATORY_CARE_PROVIDER_SITE_OTHER): Payer: Medicare Other | Admitting: Obstetrics & Gynecology

## 2018-02-12 VITALS — BP 114/64 | HR 106 | Resp 16 | Ht 67.0 in | Wt 129.0 lb

## 2018-02-12 DIAGNOSIS — R87619 Unspecified abnormal cytological findings in specimens from cervix uteri: Secondary | ICD-10-CM | POA: Diagnosis not present

## 2018-02-12 DIAGNOSIS — N870 Mild cervical dysplasia: Secondary | ICD-10-CM | POA: Diagnosis not present

## 2018-02-12 DIAGNOSIS — Z3202 Encounter for pregnancy test, result negative: Secondary | ICD-10-CM

## 2018-02-12 LAB — POCT URINE PREGNANCY: Preg Test, Ur: NEGATIVE

## 2018-02-12 NOTE — Addendum Note (Signed)
Addended by: Granville LewisLARK, Brayden Betters L on: 02/12/2018 11:29 AM   Modules accepted: Orders

## 2018-02-12 NOTE — Progress Notes (Signed)
   Subjective:    Patient ID: Holly [name], female    [name]: 12/13/1975, 42 y.o.   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  Pt on POPs for headache management.  Pt has cycle during 4th week of meds.  Pt c/o dysmenorrhea.  ?Adenomyosis on US.  Pt has never tried continuous POPs.  Review of Systems  Constitutional: Negative.   Respiratory: Negative.   Cardiovascular: Negative.   Gastrointestinal: Negative.   Genitourinary: Positive for menstrual problem.  Psychiatric/Behavioral: Negative.        Objective:   Physical Exam  Constitutional: She is oriented to person, place, and time. She appears well-developed and well-nourished. No distress.  HENT:  Head: Normocephalic and atraumatic.  Eyes: Conjunctivae are normal.  Cardiovascular: Normal rate.  Pulmonary/Chest: Effort normal.  Abdominal: Soft. Bowel sounds are normal. There is no tenderness.  Musculoskeletal: She exhibits no edema.  Neurological: She is alert and oriented to person, place, and time.  Skin: Skin is warm and dry.  Psychiatric: She has a normal mood and affect.  Vitals reviewed.  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  1-Skip placebo pills (take only first 3 weeks).  See if this helps with dysmenorrhea.  2.  LEEP  LEEP PROCEDURE NOTE Pap smear and colposcopy reviewed.   Pap ASC-H Colpo Biopsy Cin 1-2 ECC neg Risks, benefits, alternatives, and limitations of procedure explained to patient, including pain, bleeding, infection, failure to remove abnormal tissue and failure to cure dysplasia, need for repeat procedures, damage to pelvic organs, cervical incompetence.  Role of HPV,cervical dysplasia and need for close followup was empasized. Pt understands procedure is diagnostic and therapeutic.  There is  chance that LEEP may only reveal CIN 1.  Informed written consent was obtained. All questions were answered. Time out performed.  Procedure: The patient was placed in lithotomy position and the bivalved coated speculum was placed in the patient's vagina. A grounding pad placed on the patient. Lugol's solution was applied to the cervix and areas of decreased uptake were noted 10-1 o'clock.   Local anesthesia was administered via an intracervical block using 10cc of 2% Lidocaine with epinephrine. The suction was turned on and the Large shallow 1X Fisher Cone Biopsy Excisor on 50 Watts of cutting current was used to excise the area of decreased uptake and excise the entire transformation zone. Excellent hemostasis was achieved using roller ball coagulation set at 50 Watts coagulation current. Monsel's solution was then applied and the speculum was removed from the vagina. Specimens were sent to pathology. The patient tolerated the procedure well. Post-operative instructions given to patient, including instruction to seek medical attention for persistent bright red bleeding, fever, abdominal/pelvic pain, dysuria, nausea or vomiting. She was also told about the possibility of having copious yellow to black tinged discharge. She was counseled to avoid anything in the vagina (sex/douching/tampons) for 4 weeks. She has a  3 week post-operative check to review results and assess wound healing. Follow up in 4 months for repeat pap or as needed.

## 2018-02-20 ENCOUNTER — Telehealth: Payer: Self-pay | Admitting: Obstetrics & Gynecology

## 2018-02-20 NOTE — Telephone Encounter (Signed)
Pt called stating that she had severe Rt groin pain radiating to her back.  She is S/P LEEP from 02/10/18.  She denies any fever but has started her period.  Pt states that she has a very high tolerance of pain.  She took 1/23 Vicodin last night and pain is a little better today.  This does not sound like it is related to the LEEP.  I mentioned maybe an ovarian cyst or possibly a kidney stone.  Pt has agreed to go to the ED if the pain becomes as bad as it was last night.

## 2018-02-20 NOTE — Telephone Encounter (Signed)
Pt called concerned of symptoms  s/p LEEP 02/12/18. Yesterday beginning early morning sharp right lower pain that extended all the way around and into her back but remaining worse on the right side. Pt states she was bleeding heavier than normal, throwing up from pain, and crying. Pt states she considered going to the ED but is home alone with six children. She went to bed with 1/2 vicadin she as a few on hand for severe migraines. Today less bleeding and not as severe but still bad. Pt wants to know "at what point do I go to the hospital? What should she do? Pt states no fever. Call pt [phone].

## 2018-02-25 ENCOUNTER — Telehealth: Payer: Self-pay | Admitting: *Deleted

## 2018-02-25 NOTE — Telephone Encounter (Signed)
-----   Message from Lesly DukesKelly H Leggett, MD sent at 02/21/2018  2:25 PM EDT ----- CIN 1 dysplasia.  Pap smear in 1 year.

## 2018-02-25 NOTE — Telephone Encounter (Signed)
Pt notified of Leep biopsy of being CIN 1 and she will need a pap in 1 year per Dr Penne LashLeggett.

## 2018-02-26 ENCOUNTER — Ambulatory Visit (INDEPENDENT_AMBULATORY_CARE_PROVIDER_SITE_OTHER): Payer: Medicare Other | Admitting: Osteopathic Medicine

## 2018-02-26 ENCOUNTER — Ambulatory Visit (INDEPENDENT_AMBULATORY_CARE_PROVIDER_SITE_OTHER): Payer: Medicare Other

## 2018-02-26 ENCOUNTER — Encounter: Payer: Self-pay | Admitting: Osteopathic Medicine

## 2018-02-26 VITALS — BP 123/72 | HR 91 | Temp 98.1°F | Wt 128.1 lb

## 2018-02-26 DIAGNOSIS — R0781 Pleurodynia: Secondary | ICD-10-CM

## 2018-02-26 MED ORDER — DICLOFENAC SODIUM 1 % TD GEL: 4.0000 g | Freq: Four times a day (QID) | TRANSDERMAL | 11 refills | Status: DC

## 2018-02-26 MED ORDER — OMEPRAZOLE 40 MG PO CPDR: 40.0000 mg | DELAYED_RELEASE_CAPSULE | Freq: Every day | ORAL | 3 refills | Status: DC

## 2018-02-26 NOTE — Progress Notes (Signed)
HPI: Holly [name] is a 42 y.o. female who  has a past medical history of Depression, Fibromyalgia (2018), GERD (gastroesophageal reflux disease), Hemiplegia (HCC), IBS (irritable bowel syndrome), Migraine, MRSA (methicillin resistant staph aureus) culture positive, and Vaginal Pap smear, abnormal.  she presents to [facility] today, 02/26/18,  for chief complaint of:  Pain in chest  . Context: no known injury. Hx chronic pain, headaches, fibrmyalgia - see below for medication management history  . Location: between breasts/sternum . Quality: can't really describe what it feels like, almost like something is dislocated . Duration: 6-12 months, last for a few days when she gets it, then resolve spontaneously. . Modifying factors: worse with hugging, putting on a bra. She is starting to reflexively shrink away from higs from others, this is bothering her of course.  . Assoc signs/symptoms: see ROS - importantly, no orthopnea, cough, palpitations, dizziness, rash. She reports worsening GERD symptoms and feeling of choking occasionally due to this  Current meds: -Emgality injections for miraine -Ibuprofen 800 mg prn - trying to avoid d/t concern for rebound -Fioricet - avoiding if possible -Compazine prn migraine -Sertaline 50 daily - helping more than the Cymbalta -Trokendi XR 200 mg daily  -CBD oil   Previous meds: Imitrex (concern for triptans in hemiplegic migraines) Fioricet/Codeine Klonopin (for anxiety, hasn't been taking this) Celebrex (caused rash) Ketorolac Seroquel Cymbalta Topiramate Ambien Phenergan Amitriptyline  Nortriptyline  Gabapentin Baclofen 10 mg qhs - not much help BuSpar  Camvia  Vicodin     Past medical history, surgical history, social history and family history reviewed. No updates needed.   Current medication list and allergy/intolerance information reviewed.    Current Outpatient Medications on File  Prior to Visit  Medication Sig Dispense Refill  . butalbital-acetaminophen-caffeine (FIORICET, ESGIC) 50-325-40 MG tablet TAKE 1 TABLET BY MOUTH EVERY 6 HOURS AS NEEDED FOR HEADACHE 15 tablet 1  . Galcanezumab-gnlm (EMGALITY) 120 MG/ML SOAJ Inject 120 mg into the skin every 30 (thirty) days. 1 pen 11  . ibuprofen (IBU) 800 MG tablet Take 1 tablet (800 mg total) by mouth every 8 (eight) hours as needed for mild pain or moderate pain. 30 tablet 0  . montelukast (SINGULAIR) 10 MG tablet Take 1 tablet (10 mg) by mouth daily in the evening 90 tablet 3  . norethindrone (MICRONOR,CAMILA,ERRIN) 0.35 MG tablet Take 1 tablet (0.35 mg total) by mouth daily. 28 tablet 5  . omeprazole (PRILOSEC) 20 MG capsule Take 1 capsule (20 mg total) by mouth daily. 90 capsule 3  . ondansetron (ZOFRAN) 8 MG tablet Take by mouth.    . prochlorperazine (COMPAZINE) 10 MG tablet Take 1 tablet (10 mg total) by mouth every 6 (six) hours as needed. 30 tablet 5  . QC LORATADINE-D 10-240 MG 24 hr tablet Take 1 tablet by mouth daily. 30 tablet 11  . sertraline (ZOLOFT) 50 MG tablet Take 1 tablet (50 mg) by mouth daily 90 tablet 3  . TROKENDI XR 200 MG CP24 Take 1 capsule by mouth at bedtime. 30 capsule 11   No current facility-administered medications on file prior to visit.    Allergies  Allergen Reactions  . Terbutaline Palpitations  . Sumatriptan   . Adhesive [Tape] Itching and Rash  . Aimovig [Erenumab-Aooe] Rash  . Celebrex [Celecoxib] Rash  . Ciprofloxacin Rash  . Zyrtec Allergy [Cetirizine Hcl] Other (See Comments)    Overly sleepy      Review of Systems:  Constitutional: No recent illness  HEENT: No  headache, no vision change  Cardiac: +chest pain as per HPI, No  pressure, No palpitations  Respiratory:  No  shortness of breath. No  Cough  Gastrointestinal: No  abdominal pain, no change on bowel habits  Musculoskeletal: No new myalgia/arthralgia other than chest as noted above  Skin: No   Rash  Hem/Onc: No  easy bruising/bleeding, No  abnormal lumps/bumps   Exam:  BP 123/72 (BP Location: Left Arm, Patient Position: Sitting, Cuff Size: Normal)   Pulse 91   Temp 98.1 F (36.7 C) (Oral)   Wt 128 lb 1.3 oz (58.1 kg)   BMI 20.06 kg/m   Constitutional: VS see above. General Appearance: alert, well-developed, well-nourished, NAD  Eyes: Normal lids and conjunctive, non-icteric sclera  Ears, Nose, Mouth, Throat: MMM, Normal external inspection ears/nares/mouth/lips/gums.  Neck: No masses, trachea midline.   Respiratory: Normal respiratory effort. no wheeze, no rhonchi, no rales  Cardiovascular: S1/S2 normal, no murmur, no rub/gallop auscultated. RRR.   Musculoskeletal: Gait normal. Symmetric and independent movement of all extremities. Chest wall tenderness lower right sternal border, sternal/chondral junction area  BREAST: No rashes/skin changes, normal fibrous breast tissue, no masses or tenderness, normal nipple without discharge, normal axilla  Neurological: Normal balance/coordination. No tremor.  Skin: warm, dry, intact.   Psychiatric: Normal judgment/insight. Normal mood and affect. Oriented x3.     ASSESSMENT/PLAN:   Rib pain on right side - likely fibromyalgia symptom, possible variant of costochondritis, patient already on ibuprofen 800, let's try adding voltaren gel, XR. Consider CT - Plan: DG Chest 2 View, DG Ribs Unilateral Right   Meds ordered this encounter  Medications  . omeprazole (PRILOSEC) 40 MG capsule    Sig: Take 1 capsule (40 mg total) by mouth daily.    Dispense:  90 capsule    Refill:  3    Cancel the 20 mg Rx thanks  . diclofenac sodium (VOLTAREN) 1 % GEL    Sig: Apply 4 g topically 4 (four) times daily. To affected area.    Dispense:  100 g    Refill:  11      Follow-up plan: Return if symptoms worsen or fail to improve.  Visit summary with medication list and pertinent instructions was printed for patient to review, alert  [place] if any changes needed. All questions at time of visit were answered - patient instructed to contact office with any additional concerns. ER/RTC precautions were reviewed with the patient and understanding verbalized.   Note: Total time spent 25 minutes, greater than 50% of the visit was spent face-to-face counseling and coordinating care for the following: The encounter diagnosis was Rib pain on right side..  Please note: voice recognition software was used to produce this document, and typos may escape review. Please contact Dr. Lyn Hollingshead for any needed clarifications.

## 2018-02-28 ENCOUNTER — Telehealth: Payer: Self-pay | Admitting: Obstetrics & Gynecology

## 2018-02-28 ENCOUNTER — Telehealth: Payer: Self-pay | Admitting: Diagnostic Neuroimaging

## 2018-02-28 ENCOUNTER — Telehealth: Payer: Self-pay

## 2018-02-28 DIAGNOSIS — G43119 Migraine with aura, intractable, without status migrainosus: Secondary | ICD-10-CM | POA: Diagnosis not present

## 2018-02-28 NOTE — Telephone Encounter (Signed)
Patient called with severe headaches, likely related to weather, menstrual cycle and recent procedure. Has used fioricet and zofran. She is waiting for her mother to come to her home so they may go to urgent care or ER for migraine infusion cocktail.  I reassured patient and agree with her plan.  Suanne MarkerVIKRAM R. Akhil Piscopo, MD 02/28/2018, 4:04 PM Certified in Neurology, Neurophysiology and Neuroimaging  [facility] [address], [address] [place], [place] [address] [phone]

## 2018-02-28 NOTE — Telephone Encounter (Signed)
Pt called last night and left a message stating that she was having bleeding after LEEP procedure on 3/27 and had a "clot the size of a half dollar". I called the pt back today and pt stated that at around 1:00am she still had bleeding and cramping and considered going to the ED but, had no one to watch her kids. She stated that the bleeding has slowed down a lot and she is not experiencing the cramping anymore. I spoke with Armandina StammerJennifer Howard, RN and she said to tell the pt if the bleeding continues to where she is filling up a pad every hour and if she gets a fever then she needs to go to [facility]. I informed pt of this and she expressed understanding. I also offered for pt to be seen here next week but, she declined.

## 2018-02-28 NOTE — Telephone Encounter (Signed)
Pt called stating s/p LEEP 02/12/18. Pt complaining of bleeding and a clot on the tissue the size of a half dollar. Please advise if this is normal or if she should be seen.

## 2018-03-05 ENCOUNTER — Encounter: Payer: Self-pay | Admitting: Obstetrics & Gynecology

## 2018-03-05 ENCOUNTER — Ambulatory Visit (INDEPENDENT_AMBULATORY_CARE_PROVIDER_SITE_OTHER): Payer: Medicare Other | Admitting: Obstetrics & Gynecology

## 2018-03-05 VITALS — BP 118/65 | HR 92 | Wt 129.0 lb

## 2018-03-05 DIAGNOSIS — E559 Vitamin D deficiency, unspecified: Secondary | ICD-10-CM | POA: Diagnosis not present

## 2018-03-05 DIAGNOSIS — N921 Excessive and frequent menstruation with irregular cycle: Secondary | ICD-10-CM

## 2018-03-05 DIAGNOSIS — R971 Elevated cancer antigen 125 [CA 125]: Secondary | ICD-10-CM | POA: Diagnosis not present

## 2018-03-05 LAB — CBC
HCT: 35 % (ref 35.0–45.0)
Hemoglobin: 12.2 g/dL (ref 11.7–15.5)
MCH: 31.2 pg (ref 27.0–33.0)
MCHC: 34.9 g/dL (ref 32.0–36.0)
MCV: 89.5 fL (ref 80.0–100.0)
MPV: 8.9 fL (ref 7.5–12.5)
Platelets: 237 10*3/uL (ref 140–400)
RBC: 3.91 10*6/uL (ref 3.80–5.10)
RDW: 12.5 % (ref 11.0–15.0)
WBC: 5.2 10*3/uL (ref 3.8–10.8)

## 2018-03-05 MED ORDER — MEGESTROL ACETATE 40 MG PO TABS: ORAL_TABLET | ORAL | 1 refills | Status: DC

## 2018-03-05 NOTE — Progress Notes (Signed)
   Subjective:    Patient ID: Holly [name], female    DOB: 1976-05-26, 42 y.o.   MRN: [id number]  HPI  42 yo female presents for f/u of LEEP on March 27th.  Pt has dysmenorrhea and menorrhagia.  LMP started 02/19/18.  It was heavy 3rd-5th.  Finished on the 7th.  On April 1th, pt had bleeding again with a clot.  Cramping present.  Pt was worried this was post LEEP bleeding.    Pt not sexually active--separated.    Review of Systems  Constitutional: Positive for fatigue.  Respiratory: Negative.   Cardiovascular: Negative.   Gastrointestinal: Positive for constipation and diarrhea.  Genitourinary: Positive for pelvic pain and vaginal bleeding.  Psychiatric/Behavioral: Negative.        Objective:   Physical Exam  Constitutional: She is oriented to person, place, and time. She appears well-developed and well-nourished. No distress.  HENT:  Head: Normocephalic and atraumatic.  Eyes: Conjunctivae are normal.  Pulmonary/Chest: Effort normal.  Abdominal: Soft. Bowel sounds are normal. She exhibits no distension. There is no tenderness.  Musculoskeletal: She exhibits no edema.  Neurological: She is alert and oriented to person, place, and time.  Skin: Skin is warm and dry.  Psychiatric: She has a normal mood and affect.  Vitals reviewed.  Vitals:   03/05/18 1005  BP: 118/65  Pulse: 92  Weight: 129 lb (58.5 kg)    Assessment & Plan:  42 yo female s/p LEEP. Healing well.  1-Endometrial biopsy for menomet 2-Megace taper 3-Resume cont POPs after Megace. 4-RTC if no improvement. 5-Pt counseled on spousal safety given husbands mental illness.  Pt states she is safe and knows triggers.

## 2018-03-06 ENCOUNTER — Telehealth: Payer: Self-pay | Admitting: *Deleted

## 2018-03-06 NOTE — Telephone Encounter (Signed)
-----   Message from Lesly DukesKelly H Leggett, MD sent at 03/06/2018 10:33 AM EDT ----- Vit D normal.

## 2018-03-06 NOTE — Telephone Encounter (Signed)
Pt is aware of normal Vit D level

## 2018-03-11 LAB — VITAMIN D 25 HYDROXY (VIT D DEFICIENCY, FRACTURES): Vit D, 25-Hydroxy: 31 ng/mL (ref 30–100)

## 2018-03-11 LAB — T3, REVERSE: T3, Reverse: 13 ng/dL (ref 8–25)

## 2018-03-14 ENCOUNTER — Other Ambulatory Visit: Payer: Self-pay | Admitting: Osteopathic Medicine

## 2018-03-14 DIAGNOSIS — M791 Myalgia, unspecified site: Secondary | ICD-10-CM

## 2018-03-14 DIAGNOSIS — M797 Fibromyalgia: Secondary | ICD-10-CM

## 2018-03-14 NOTE — Telephone Encounter (Signed)
[facility] requesting med refill for celebrex. According to provider's OV note on 02/26/18, med causes rash. Pls advise if refill is appropriate. Thanks.

## 2018-03-17 ENCOUNTER — Ambulatory Visit (INDEPENDENT_AMBULATORY_CARE_PROVIDER_SITE_OTHER): Payer: Medicare Other | Admitting: Adult Health

## 2018-03-17 ENCOUNTER — Encounter: Payer: Self-pay | Admitting: Adult Health

## 2018-03-17 VITALS — BP 112/70 | HR 88 | Ht 66.75 in | Wt 130.8 lb

## 2018-03-17 DIAGNOSIS — G43409 Hemiplegic migraine, not intractable, without status migrainosus: Secondary | ICD-10-CM

## 2018-03-17 MED ORDER — TROKENDI XR 200 MG PO CP24
1.0000 | ORAL_CAPSULE | Freq: Every day | ORAL | 11 refills | Status: DC
Start: 2018-03-17 — End: 2019-02-13

## 2018-03-17 MED ORDER — BUTALBITAL-APAP-CAFFEINE 50-325-40 MG PO TABS: ORAL_TABLET | ORAL | 1 refills | Status: DC

## 2018-03-17 NOTE — Telephone Encounter (Signed)
Left a brief vm msg for pt regarding clarification for celebrex med refill. Call back information provided.

## 2018-03-17 NOTE — Patient Instructions (Addendum)
Your Plan:  Continue Eemgality Continue Trokendi and Fioricet  If your symptoms worsen or you develop new symptoms please let us know.   Thank you for coming to see [place] at [facility]. I hope we have been able to provide you high quality care today.  You may receive a patient satisfaction survey over the next few weeks. We would appreciate your feedback and comments so that we may continue to improve ourselves and the health of our patients.

## 2018-03-17 NOTE — Progress Notes (Signed)
PATIENT: Holly [name] DOB: [birth year]  REASON FOR VISIT: follow up HISTORY FROM: patient  HISTORY OF PRESENT ILLNESS: Today 03/17/18  Holly [name] is a 42 year old female with a history of migraine headaches.  She returns today for follow-up.  She remains on Trokendi and Fioricet.  She states that she has had 3 injections of Emgality.  She reports that her headache severity has definitely improved.  She continues to have a daily headache however since starting the injections her headaches are not lasting all day long.  She also reports that she is not having to go to urgent care as frequently as before.  She states that she has one severe migraine with hemiplegic symptoms at least once a week.  She states that she did have the LEAP procedure due to an abnormal Pap smear and she has significant bleeding afterwards and was placed on a progesterone tablet.  She reports for this reason her headaches were slightly worse during this time.  She has stopped the progesterone.  She also reports that with the injection she has noticed constipation within the first 10 days after the injection.  She states that she has tried Colace with no benefit.  She did try MiraLAX and that seemed to work better.  She returns today for an evaluation.  HISTORY  09/16/2017: She has been diagnosed with fibromyalgia. She is allergic to Celebrex. She is seeing her pcp and they may start Lyrica, which is also a migraine medication. She saw another doctor, who is a good neurologist and I recommend following with them. She uses Fioricet which I don't prefer but it is the only think that helps her, she has been advised about rebound headache. She has also seen Dr. Catalina Lunger at the [facility] [address] [facility].   Interval History 08/07/2016:Holly A Garrettis a 42 y.o. femalehere as a referral from Dr. Alba Destine hemiplegic migraines. She has severe migraines what impair her both physicilly and cognitively, she can't even  remember her own name, she can;t walk straight, her balance is impaired, she has slurred speech, she stutters, can;t take care of her 6 children or at times herself when recurrence of migraines. 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 The last several have been extreme in her severity. She has gone to the emergency room multiple times for severe refractory migraines. Patient's migraines have improved with Topiramate but they are still occurring 3-4 daysa week and can be severe when she has a migraine 10 days in a row. She takes Topiramate  daily. She is breastfeeding. When she has migraines she is debilitated often and has to stay in bed in a dark room all day and her husband has had to stay home. She has 15-17 migraine days in a month. She is weaning her last child this last month and her hormones are fluctuating. Will have the Botox representative call.   Meds tried: Topiramate,Amitrip, nortrip. Currently on topamax  daily. Also tried venlafaxine, imitrex, maxalt, treximet. Blood pressure medications are contraindicated due to her very low blood pressure.   Holly [name] 05/29/2016: Today 04/29/2016: Holly [name] is a 42 year old female with a history of migraine headaches. She returns today for follow-up. She states that her headaches have improved with Topamax. She states she typically has 1-2 headaches a week. Before she was having 3-5 headaches a week. She is currently taken Topamax 150 mg at bedtime. She  reports that with her migraines she is experiencing weakness on the right side typically in the arm but it can move to the leg. She also noticed slurring speech and stuttering. In the past the symptoms have been worrisome and she went to the emergency room thinking she was having a stroke. She was told that it was migraine headache. She reports that the symptoms never occur without a  migraine. She uses caffeine, Tylenol, Advil and Fioricet in that order to treat her acute headaches. In the past she has tried triptans and Cambia with minimal benefit. She states baclofen was helpful for her headaches however it stopped her milk production. She plans to continue breast-feeding to the child is 46 months old. She returns today for an evaluation.    HPI 04/06/2016:Holly A Garrettis a 42 y.o.femalehere as a referral from Dr. Duanne Guess. She has had migraines for 21 years. She has tried a lot of things. Migraines are affecting her speech and she is slurring stuttering with the migraines. She trained in nursing and psychology. She has daily headaches, migraines. They are on the left side 98% of the time, behind the eye, throbbing and pounding like a "pick axe", laying down in a dark room helps, endorses extreme sounds sensitivity, light sensitivity, smell triggers, no aura. 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 No medication overuse. She had extensive testing at the [facility] with MRIs and LPs. She has nausea and vomiting with the migraines. No inciting event, no head trauma, has a FHx of migraines. Lack of sleep worsens, drinks caffeine once a day. She drink enough fluid. She was bedridden for a whole year with migraines. She has daily migraines that can last all day.No other focal neurologic deficits. She does have musculoskeletal neck pain.  She has tried everything: Only Fioricet and Vicodin helps.That is the only thing she takes. She tried Amitrip, nortrip. Currently on topamax  daily. Also tried venlafaxine, imitrex, maxalt, treximet.   Never tried propranolol  Reviewed notes, labs and imaging from outside physicians, which showed:  CT if the head 2004: CLINICAL DATA: HEADACHE. RIGHT AND LEFT-SIDED NUMBNESS OF ARMS AND LEGS. BLURRED VISION FOR TWO WEEKS. TWO MONTHS POST PARTUM. PREVIOUS MRI OF THE BRAIN IN 1998.CT SCAN OF THE HEAD WITHOUT CONTRAST  WITH BONE WINDOWS A SERIES OF SCANS OF THE ENTIRE HEAD ARE MADE WITHOUT CONTRAST AND SHOW NO EVIDENCE OF INTRACRANIAL MASS OR HEMORRHAGE. THERE IS NO SHIFT OF MIDLINE STRUCTURES. THE VENTRICULAR SYSTEM APPEARS NORMAL. BONE WINDOWS SHOW THE PARANASAL SINUSES, BASE OF THE SKULL, BONY CALVARIUM, AND INTERNAL AUDITORY CANALS TO BE NORMAL. IMPRESSION NORMAL CT SCAN OF THE HEAD WITHOUT CONTRAST.  RPR, HIV negative.   Reviewed notes from OBGyn: Pt has had migraines for 20 years. Patient reports continuous migraine for 3 months. She is on Topamax  qhs. She has tunnel vision with migraines. Cries unconsolably with blood in her tears. She has diarrhea with the Topamax but wants to continue it. She is nursing her baby and is almost done, decreased sleep and fatigue is worsening migraines. 5 trips to the ER in the last 6 weeks. Imitrex gave her a panic attack and numbness in the face. Exam was unremarkable. On Zofran and Topiramate.    REVIEW OF SYSTEMS: Out of a complete 14 system review of symptoms, the patient complains only of the following symptoms, and all other reviewed systems are negative.  Fatigue, ringing in ears, constipation, insomnia, frequent waking her back pain, bruise/bleed easily  ALLERGIES:  Allergies  Allergen Reactions  . Terbutaline Palpitations  . Sumatriptan   . Adhesive [Tape] Itching and Rash  . Aimovig [Erenumab-Aooe] Rash  . Celebrex [Celecoxib] Rash  . Ciprofloxacin Rash  . Zyrtec Allergy [Cetirizine Hcl] Other (See Comments)    Overly sleepy    HOME MEDICATIONS: Outpatient Medications Prior to Visit  Medication Sig Dispense Refill  . butalbital-acetaminophen-caffeine (FIORICET, ESGIC) 50-325-40 MG tablet TAKE 1 TABLET BY MOUTH EVERY 6 HOURS AS NEEDED FOR HEADACHE 15 tablet 1  . diclofenac sodium (VOLTAREN) 1 % GEL Apply 4 g topically 4 (four) times daily. To affected area. 100 g 11  . Galcanezumab-gnlm (EMGALITY) 120 MG/ML SOAJ Inject 120 mg into the skin  every 30 (thirty) days. 1 pen 11  . ibuprofen (IBU) 800 MG tablet Take 1 tablet (800 mg total) by mouth every 8 (eight) hours as needed for mild pain or moderate pain. 30 tablet 0  . megestrol (MEGACE) 40 MG tablet Take one tablet bid for 3 days then one tablet daily for 4 14 tablet 1  . montelukast (SINGULAIR) 10 MG tablet Take 1 tablet (10 mg) by mouth daily in the evening 90 tablet 3  . norethindrone (MICRONOR,CAMILA,ERRIN) 0.35 MG tablet Take 1 tablet (0.35 mg total) by mouth daily. 28 tablet 5  . omeprazole (PRILOSEC) 40 MG capsule Take 1 capsule (40 mg total) by mouth daily. 90 capsule 3  . ondansetron (ZOFRAN) 8 MG tablet Take by mouth.    . prochlorperazine (COMPAZINE) 10 MG tablet Take 1 tablet (10 mg total) by mouth every 6 (six) hours as needed. 30 tablet 5  . QC LORATADINE-D 10-240 MG 24 hr tablet Take 1 tablet by mouth daily. 30 tablet 11  . sertraline (ZOLOFT) 50 MG tablet Take 1 tablet (50 mg) by mouth daily 90 tablet 3  . TROKENDI XR 200 MG CP24 Take 1 capsule by mouth at bedtime. 30 capsule 11   No facility-administered medications prior to visit.     PAST MEDICAL HISTORY: Past Medical History:  Diagnosis Date  . Depression   . Fibromyalgia 2018  . GERD (gastroesophageal reflux disease)   . Hemiplegia (HCC)   . IBS (irritable bowel syndrome)   . Migraine   . MRSA (methicillin resistant staph aureus) culture positive   . Vaginal Pap smear, abnormal     PAST SURGICAL HISTORY: Past Surgical History:  Procedure Laterality Date  . WRIST SURGERY     cyst removal    FAMILY HISTORY: Family History  Problem Relation Age of Onset  . Thyroid disease Mother   . Scoliosis Mother   . Depression Mother   . Migraines Mother   . Hyperlipidemia Mother   . Hypertension Mother   . Heart attack Mother   . Crohn's disease Father   . Autoimmune disease Father   . Factor V Leiden deficiency Father   . Heart failure Father   . Diabetes Father   . Hyperlipidemia Father   .  Heart attack Father   . Cancer Sister   . Depression Sister   . Diabetes Sister   . Diabetes Paternal Grandfather     SOCIAL HISTORY: Social History   Socioeconomic History  . Marital status: Married    Spouse name: Not on file  . Number of children: 6  . Years of education: Not on file  . Highest education level: Not on file  Occupational History  . Occupation: Arts development officer  Social Needs  . Financial resource strain: Not on file  .  Food insecurity:    Worry: Not on file    Inability: Not on file  . Transportation needs:    Medical: Not on file    Non-medical: Not on file  Tobacco Use  . Smoking status: Never Smoker  . Smokeless tobacco: Never Used  Substance and Sexual Activity  . Alcohol use: No    Alcohol/week: 0.0 oz    Comment: none  . Drug use: No  . Sexual activity: Yes    Birth control/protection: Pill  Lifestyle  . Physical activity:    Days per week: Not on file    Minutes per session: Not on file  . Stress: Not on file  Relationships  . Social connections:    Talks on phone: Not on file    Gets together: Not on file    Attends religious service: Not on file    Active member of club or organization: Not on file    Attends meetings of clubs or organizations: Not on file    Relationship status: Not on file  . Intimate partner violence:    Fear of current or ex partner: Not on file    Emotionally abused: Not on file    Physically abused: Not on file    Forced sexual activity: Not on file  Other Topics Concern  . Not on file  Social History Narrative   1 caffeine drink a day at max   Right handed   Lives at home with children. Husband is a Naval architect and rarely home.      PHYSICAL EXAM  Vitals:   03/17/18 0917  BP: 112/70  Pulse: 88  Weight: 130 lb 12.8 oz (59.3 kg)  Height: 5' 6.75" (1.695 m)   Body mass index is 20.64 kg/m.  Generalized: Well developed, in no acute distress   Neurological examination  Mentation: Alert oriented to  time, place, history taking. Follows all commands speech and language fluent Cranial nerve II-XII: Pupils were equal round reactive to light. Extraocular movements were full, visual field were full on confrontational test. Facial sensation and strength were normal. Uvula tongue midline. Head turning and shoulder shrug  were normal and symmetric. Motor: The motor testing reveals 5 over 5 strength of all 4 extremities. Good symmetric motor tone is noted throughout.  Sensory: Sensory testing is intact to soft touch on all 4 extremities. No evidence of extinction is noted.  Coordination: Cerebellar testing reveals good finger-nose-finger and heel-to-shin bilaterally.  Gait and station: Gait is normal. Tandem gait is normal. Romberg is negative. No drift is seen.  Reflexes: Deep tendon reflexes are symmetric and normal bilaterally.   DIAGNOSTIC DATA (LABS, IMAGING, TESTING) - I reviewed patient records, labs, notes, testing and imaging myself where available.  Lab Results  Component Value Date   WBC 5.2 03/05/2018   HGB 12.2 03/05/2018   HCT 35.0 03/05/2018   MCV 89.5 03/05/2018   PLT 237 03/05/2018      Component Value Date/Time   NA 140 01/21/2018 0935   K 4.1 01/21/2018 0935   CL 111 (H) 01/21/2018 0935   CO2 24 01/21/2018 0935   GLUCOSE 94 01/21/2018 0935   BUN 9 01/21/2018 0935   CREATININE 1.02 01/21/2018 0935   CALCIUM 8.9 01/21/2018 0935   PROT 6.7 01/21/2018 0935   ALBUMIN 4.2 02/12/2017 1510   AST 11 01/21/2018 0935   ALT 6 01/21/2018 0935   ALKPHOS 56 02/12/2017 1510   BILITOT 0.5 01/21/2018 0935   GFRNONAA 69 01/21/2018  0935   GFRAA 80 01/21/2018 0935   Lab Results  Component Value Date   CHOL 109 01/21/2018   HDL 43 (L) 01/21/2018   LDLCALC 53 01/21/2018   TRIG 51 01/21/2018   CHOLHDL 2.5 01/21/2018    Lab Results  Component Value Date   TSH 1.40 01/21/2018      ASSESSMENT AND PLAN 42 y.o. year old female  has a past medical history of Depression,  Fibromyalgia (2018), GERD (gastroesophageal reflux disease), Hemiplegia (HCC), IBS (irritable bowel syndrome), Migraine, MRSA (methicillin resistant staph aureus) culture positive, and Vaginal Pap smear, abnormal. here with:  1.  Migraine headaches  The patient will continue on Trokendi and Fioricet.  She will continue with Emgality injections.  She is advised that she should keep a headache journal.  If her symptoms worsen or she develops new symptoms she should let us know.  We will see her back in 4 months or sooner if needed.  I spent 15 minutes with the patient. 50% of this time was spent discussing her migraine frequency and medication.   Holly Penny, MSN, NP-C 03/17/2018, 9:14 AM [facility] [address], [address] [place], [place] [address] [phone]

## 2018-03-17 NOTE — Telephone Encounter (Signed)
Would clarify with patient - pharmacy may have this on auto-refill?

## 2018-03-17 NOTE — Telephone Encounter (Signed)
OK to refill now

## 2018-03-17 NOTE — Telephone Encounter (Signed)
Pt called stating she had a rash reaction to a shaving cream product she was using, during the same time she was taking celebrex med. Pt requesting to retry medication, since she is no longer using shaving product. As per pt, stated that she can also wait until her next visit w/PCP to discuss med refill in details. Requesting feed back from provider. Thanks.

## 2018-03-17 NOTE — Telephone Encounter (Signed)
Pt has been updated.  

## 2018-03-21 ENCOUNTER — Other Ambulatory Visit: Payer: Self-pay | Admitting: Osteopathic Medicine

## 2018-03-21 DIAGNOSIS — G43409 Hemiplegic migraine, not intractable, without status migrainosus: Secondary | ICD-10-CM

## 2018-03-28 NOTE — Progress Notes (Signed)
Personally  participated in, made any corrections needed, and agree with history, physical, neuro exam,assessment and plan as stated above.    Alyzae Hawkey, MD [facility] 

## 2018-04-08 DIAGNOSIS — J0100 Acute maxillary sinusitis, unspecified: Secondary | ICD-10-CM | POA: Diagnosis not present

## 2018-04-08 DIAGNOSIS — J0110 Acute frontal sinusitis, unspecified: Secondary | ICD-10-CM | POA: Diagnosis not present

## 2018-06-03 ENCOUNTER — Encounter: Payer: Self-pay | Admitting: Neurology

## 2018-06-03 ENCOUNTER — Other Ambulatory Visit: Payer: Self-pay | Admitting: Neurology

## 2018-06-03 DIAGNOSIS — M79671 Pain in right foot: Secondary | ICD-10-CM | POA: Diagnosis not present

## 2018-06-03 DIAGNOSIS — G43009 Migraine without aura, not intractable, without status migrainosus: Secondary | ICD-10-CM | POA: Diagnosis not present

## 2018-06-04 ENCOUNTER — Other Ambulatory Visit: Payer: Self-pay | Admitting: Neurology

## 2018-06-04 MED ORDER — PROCHLORPERAZINE MALEATE 10 MG PO TABS: 10.0000 mg | ORAL_TABLET | Freq: Four times a day (QID) | ORAL | 11 refills | Status: DC | PRN

## 2018-06-04 MED ORDER — ONDANSETRON 4 MG PO TBDP: 4.0000 mg | ORAL_TABLET | Freq: Three times a day (TID) | ORAL | 11 refills | Status: DC | PRN

## 2018-06-13 DIAGNOSIS — R51 Headache: Secondary | ICD-10-CM | POA: Diagnosis not present

## 2018-06-13 DIAGNOSIS — G43911 Migraine, unspecified, intractable, with status migrainosus: Secondary | ICD-10-CM | POA: Diagnosis not present

## 2018-06-16 ENCOUNTER — Telehealth: Payer: Self-pay | Admitting: Neurology

## 2018-06-16 ENCOUNTER — Telehealth: Payer: Self-pay | Admitting: Adult Health

## 2018-06-16 NOTE — Telephone Encounter (Signed)
Spoke with patient and advised her that [facility]. She verbalized understanding, appreciation.

## 2018-06-16 NOTE — Telephone Encounter (Signed)
Pt requesting a refill for butalbital-acetaminophen-caffeine (FIORICET, ESGIC) 50-325-40 MG tablet sent to [facility].

## 2018-06-27 ENCOUNTER — Telehealth: Payer: Self-pay

## 2018-06-27 NOTE — Telephone Encounter (Addendum)
[facility] med RF for ibuprophen. Pls advise, thanks. 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

## 2018-06-30 MED ORDER — IBUPROFEN 800 MG PO TABS: 800.0000 mg | ORAL_TABLET | Freq: Three times a day (TID) | ORAL | 1 refills | Status: DC | PRN

## 2018-06-30 NOTE — Telephone Encounter (Signed)
Sent!

## 2018-07-10 ENCOUNTER — Other Ambulatory Visit: Payer: Self-pay | Admitting: *Deleted

## 2018-07-10 NOTE — Telephone Encounter (Signed)
Last refill 06-16-18 [facility].

## 2018-07-14 MED ORDER — BUTALBITAL-APAP-CAFFEINE 50-325-40 MG PO TABS: ORAL_TABLET | ORAL | 1 refills | Status: DC

## 2018-07-14 NOTE — Telephone Encounter (Signed)
Fax confirmation received for [facility] [phone].

## 2018-07-17 ENCOUNTER — Ambulatory Visit (INDEPENDENT_AMBULATORY_CARE_PROVIDER_SITE_OTHER): Payer: Medicare Other | Admitting: Osteopathic Medicine

## 2018-07-17 ENCOUNTER — Encounter: Payer: Self-pay | Admitting: Osteopathic Medicine

## 2018-07-17 DIAGNOSIS — G5631 Lesion of radial nerve, right upper limb: Secondary | ICD-10-CM | POA: Diagnosis not present

## 2018-07-17 DIAGNOSIS — F411 Generalized anxiety disorder: Secondary | ICD-10-CM

## 2018-07-17 MED ORDER — CLONAZEPAM 0.5 MG PO TABS: 0.2500 mg | ORAL_TABLET | Freq: Two times a day (BID) | ORAL | 1 refills | Status: DC | PRN

## 2018-07-17 NOTE — Patient Instructions (Signed)
Radial Tunnel Syndrome Rehab Ask your health care provider which exercises are safe for you. Do exercises exactly as told by your health care provider and adjust them as directed. It is normal to feel mild stretching, pulling, tightness, or discomfort as you do these exercises, but you should stop right away if you feel sudden pain or your pain gets worse.Do not begin these exercises until told by your health care provider. Stretching and range of motion exercises These exercises warm up your muscles and joints and improve the movement and flexibility of your forearm. These exercises also help to relieve pain, numbness, and tingling. Exercise A: Wrist flexion, active-assisted 1. Extend your left / right arm in front of you, and point your fingers downward. 2. If told by your health care provider, bend your left / right elbow. 3. Gently tip the palm of your hand toward your forearm. 4. If told by your health care provider, use your other hand to move your palm closer to your forearm. 5. Hold this position for __________ seconds. 6. Slowly return to the starting position. Repeat __________ times. Complete this exercise __________ times a day. Exercise B: Wrist flexion  1. Stand over a tabletop with your left / right hand resting palm-up on the tabletop and your fingers pointing away from your body. Your arm should be extended, and there should be a slight bend in your elbow. 2. Gently press the back of your hand down by straightening your elbow. You should feel a stretch in the top of your forearm. 3. Hold this position for __________ seconds. 4. Slowly return to the starting position. Repeat __________ times. Complete this stretch __________ times a day. Strengthening exercises These exercises build strength and endurance in your forearm. Endurance is the ability to use your muscles for a long time, even after they get tired. Exercise C: Wrist flexors 1. Sit with your left / right forearm  supported on a table and your hand resting palm-up over the edge of the table. Your elbow should be below the level of your shoulder. 2. Hold a __________ weight in your hand. Or, hold a rubber exercise band or tube in both hands, keeping your hands at the same level and hip distance apart. There should be a slight tension in the exercise band or tube. 3. Slowly curl your hand up toward your forearm. 4. Hold this position for __________ seconds. 5. Slowly lower your hand to the starting position. Repeat __________ times. Complete this exercise __________ times a day. Exercise D: Radial deviators 1. Stand with a __________ weight in your hand. Or, sit with your healthy hand supported, and hold onto a rubber exercises band or tube. There should be a slight tension in the exercise band or tube. ? If you are holding a weight, move your thumb toward your forearm, raising your hand as far as it will go. ? If you are holding an exercise band or tube, pull on it as far as it will go with your thumb facing your forearm. 2. Hold this position for __________ seconds. 3. Slowly return to the starting position. Repeat __________ times. Complete this exercise __________ times a day. Exercise E: Grip  1. Hold one of these items in your hand: a dense sponge, a tennis ball, or a large, rolled sock. 2. Squeeze as hard as you can without increasing any pain. 3. Hold this position for __________ seconds. 4. Slowly release your grip. Repeat __________ times. Complete this exercise __________ times a day.  This information is not intended to replace advice given to you by your health care provider. Make sure you discuss any questions you have with your health care provider. Document Released: 11/05/2005 Document Revised: 07/12/2016 Document Reviewed: 10/01/2015 Elsevier Interactive Patient Education  [employer].

## 2018-07-17 NOTE — Progress Notes (Signed)
HPI: Holly [name] is a 42 y.o. female who  has a past medical history of Depression, Fibromyalgia (2018), GERD (gastroesophageal reflux disease), Hemiplegia (HCC), IBS (irritable bowel syndrome), Migraine, MRSA (methicillin resistant staph aureus) culture positive, and Vaginal Pap smear, abnormal.  she presents to [facility] today, 07/17/18,  for chief complaint of:  Mental health  Feels like it's not depression but it's different. Headache pain has been getting better but now coming back but hurting everywhere. Migraines, body aches, all the time. Has upcoming appt w/ neurologist, planning to keep this appt. On certain meds to help with pain but these don't seem to be working anymore.   Her 6 kids have told her that she's been irritable, yelling, seems angrier than usual. She notices she's interrupting them a lot, she grew up in a similar household and she is upset that she is being short with her kids. Sleep is affected.   On Zoloft, previously on Wellbutrin, BuSpar, Lexapro.  Was on a short course of clonazepam a few years ago, she has some questions about Prozac which her mom has had some success with.  Reports significant pain over the past 3 to 4 weeks in the right elbow, feels like a knife in the joint, does not seem to think any difference as far as movement in flexion or extension or supination pronation.  He notices it a bit more when she is holding something in that hand or gripping frequently.  Feels different from carpal tunnel.   Past medical history, surgical history, and family history reviewed.  Current medication list and allergy/intolerance information reviewed.   (See remainder of HPI, ROS, Phys Exam below)    ASSESSMENT/PLAN:   Generalized anxiety disorder - Leave Zoloft as is, consider increasing dose, brief trial clonazepam to hopefully help with sleep and alleviate other issues.  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    Meds ordered this encounter  Medications  . clonazePAM (KLONOPIN) 0.5 MG tablet    Sig: Take 0.5-1 tablets (0.25-0.5 mg total) by mouth 2 (two) times daily as needed for anxiety. Sparing use to prevent tolerance/dependence    Dispense:  15 tablet    Refill:  1      Follow-up plan: Return for sports medicine follow-up. Follow up with moods with Dr A in 4 weeks .                   ############################################ ############################################ ############################################ ############################################    Outpatient Encounter Medications as of 07/17/2018  Medication Sig Note  . butalbital-acetaminophen-caffeine (FIORICET, ESGIC) 50-325-40 MG tablet TAKE 1 TABLET BY MOUTH EVERY 6 HOURS AS NEEDED FOR HEADACHE   . diclofenac sodium (VOLTAREN) 1 % GEL Apply 4 g topically 4 (four) times daily. To affected area.   . Galcanezumab-gnlm (EMGALITY) 120 MG/ML SOAJ Inject 120 mg into the skin every 30 (thirty) days. 02/05/2018: EMGALITY 120 mg has been APPROVED through 11/18/18.  . ibuprofen (IBU) 800 MG tablet Take 1 tablet (800 mg total) by mouth every 8 (eight) hours as needed for mild pain or moderate pain.   . montelukast (SINGULAIR) 10 MG tablet Take 1 tablet (10 mg) by mouth daily in the evening   . norethindrone (MICRONOR,CAMILA,ERRIN) 0.35 MG tablet Take  1 tablet (0.35 mg total) by mouth daily.   Marland Kitchen omeprazole (PRILOSEC) 40 MG capsule Take 1 capsule (40 mg total) by mouth daily.   . ondansetron (ZOFRAN) 8 MG tablet Take by mouth.   . prochlorperazine (COMPAZINE) 10 MG tablet Take 1 tablet (10 mg total) by mouth every 6 (six) hours as needed.   . QC  LORATADINE-D 10-240 MG 24 hr tablet Take 1 tablet by mouth daily.   . sertraline (ZOLOFT) 50 MG tablet Take 1 tablet (50 mg) by mouth daily   . TROKENDI XR 200 MG CP24 Take 1 capsule by mouth at bedtime.   . celecoxib (CELEBREX) 100 MG capsule Take 1 capsule (100 mg total) by mouth 2 (two) times daily. (Patient not taking: Reported on 07/17/2018)   . ondansetron (ZOFRAN-ODT) 4 MG disintegrating tablet Take 1 tablet (4 mg total) by mouth every 8 (eight) hours as needed for nausea. (Patient not taking: Reported on 07/17/2018)    No facility-administered encounter medications on file as of 07/17/2018.    Allergies  Allergen Reactions  . Other Rash    Overly sleepy  . Terbutaline Palpitations  . Sumatriptan   . Adhesive [Tape] Itching and Rash  . Aimovig [Erenumab-Aooe] Rash  . Celebrex [Celecoxib] Rash    03/17/18 patient is unsure, thinks it may have been shaving cream  . Cetirizine Hcl Other (See Comments)    Overly sleepy   . Ciprofloxacin Rash      Review of Systems:  Constitutional: No recent illness  HEENT: No  headache, no vision change  Cardiac: No  chest pain, No  pressure, No palpitations  Respiratory:  No  shortness of breath. No  Cough  Gastrointestinal: No  abdominal pain, no change on bowel habits  Musculoskeletal: +new myalgia/arthralgia  Neurologic: No  weakness, No  Dizziness  Psychiatric: No  concerns with depression, +concerns with anxiety, +sleep problem  Exam:  BP 111/67 (BP Location: Left Arm, Patient Position: Sitting, Cuff Size: Normal)   Pulse (!) 105   Temp 98.5 F (36.9 C) (Oral)   Wt 134 lb (60.8 kg)   BMI 21.14 kg/m   Constitutional: VS see above. General Appearance: alert, well-developed, well-nourished, NAD  Eyes: Normal lids and conjunctive, non-icteric sclera  Ears, Nose, Mouth, Throat: MMM, Normal external inspection ears/nares/mouth/lips/gums.  Neck: No masses, trachea midline.   Respiratory: Normal respiratory effort. no wheeze,  no rhonchi, no rales  Cardiovascular: S1/S2 normal, no murmur, no rub/gallop auscultated. RRR.   Musculoskeletal: Gait normal. Symmetric and independent movement of all extremities.  Palpation of right radial tunnel reproduces the pain, also send some numbness/tingling proximally and distally, she notices some numbness/burning pain in the third and fourth fingers of the right hand.  Neurological: Normal balance/coordination. No tremor.  Skin: warm, dry, intact.   Psychiatric: Normal judgment/insight. Normal mood and affect. Oriented x3.   Visit summary with medication list and pertinent instructions was printed for patient to review, advised to alert [place] if any changes needed. All questions at time of visit were answered - patient instructed to contact office with any additional concerns. ER/RTC precautions were reviewed with the patient and understanding verbalized.   Follow-up plan: Return for sports medicine follow-up. Follow up with moods with Dr A in 4 weeks .  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 Please contact Dr. Sheppard Coil for any needed clarifications.

## 2018-07-23 ENCOUNTER — Ambulatory Visit (INDEPENDENT_AMBULATORY_CARE_PROVIDER_SITE_OTHER): Payer: Medicare Other | Admitting: Family Medicine

## 2018-07-23 ENCOUNTER — Encounter: Payer: Self-pay | Admitting: Family Medicine

## 2018-07-23 VITALS — BP 103/68 | HR 92 | Temp 98.3°F | Wt 130.6 lb

## 2018-07-23 DIAGNOSIS — M25521 Pain in right elbow: Secondary | ICD-10-CM

## 2018-07-23 NOTE — Patient Instructions (Signed)
Thank you for coming in today. Get xray today.  Arrange PT.  Recheck in 4 weeks.   Use theraband flexbar.   Continue home exercises.

## 2018-07-23 NOTE — Progress Notes (Addendum)
Subjective:    I'm seeing this patient as a consultation for:  Sunnie Nielsen, DO   CC: Right elbow pain  HPI: Almas has a 1 month history of pain in the right lateral elbow.  She notes some tingling sensation into her hand but she notes she has a history of carpal tunnel syndrome.  She notes the tingling is typically in the palmar aspect of the hand.  She had one episode of tingling in the dorsal aspect of her hand.  She has pain is present in the lateral elbow worse with activity and typically better with rest.  She denies any injury.  She was seen by her PCP on August 29 and thought to have radial tunnel syndrome.  She is been doing home exercise and stretching which have not helped much.  She cannot recall any specific change in her activity.  She tried ibuprofen which helps a bit as well.  Past medical history, Surgical history, Family history not pertinant except as noted below, Social history, Allergies, and medications have been entered into the medical record, reviewed, and no changes needed.   Review of Systems: No headache, visual changes, nausea, vomiting, diarrhea, constipation, dizziness, abdominal pain, skin rash, fevers, chills, night sweats, weight loss, swollen lymph nodes, body aches, joint swelling, muscle aches, chest pain, shortness of breath, mood changes, visual or auditory hallucinations.   Objective:    Vitals:   07/23/18 1037  BP: 103/68  Pulse: 92  Temp: 98.3 F (36.8 C)   General: Well Developed, well nourished, and in no acute distress.  Neuro/Psych: Alert and oriented x3, extra-ocular muscles intact, able to move all 4 extremities, sensation grossly intact. Skin: Warm and dry, no rashes noted.  Respiratory: Not using accessory muscles, speaking in full sentences, trachea midline.  Cardiovascular: Pulses palpable, no extremity edema. Abdomen: Does not appear distended. MSK:  Right elbow: Tender to palpation at the lateral epicondyle and into the  radial head.  Not particularly tender to the forearm. Nontender medial epicondyle.  Negative Tinel's at the cubital tunnel Pain in the lateral elbow is reproduced with resisted wrist extension.  Patient has intact strength to wrist extension.  Pulses capillary fill and sensation are intact.  Lab and Radiology Results X-ray right elbow pending  Impression and Recommendations:    Assessment and Plan: 42 y.o. female with  Right elbow pain: Possibly multifactorial.  Patient's pain is somewhat consistent with lateral epicondylitis.  The pain is also possibly consistent with radial tunnel syndrome.  Plan for x-ray today as well as referral to physical therapy for rehab.  Recheck in 4 weeks if not improving will consider further evaluation treatment including ultrasound and injection possibly..   Orders Placed This Encounter  Procedures  . DG Elbow Complete Right    Standing Status:   Future    Standing Expiration Date:   09/23/2019    Order Specific Question:   Reason for Exam (SYMPTOM  OR DIAGNOSIS REQUIRED)    Answer:   eval lateral eblow pain    Order Specific Question:   Is patient pregnant?    Answer:   No    Order Specific Question:   Preferred imaging location?    Answer:   [facility]    Order Specific Question:   Radiology Contrast Protocol - do NOT remove file path    Answer:   [email or web address]  . Ambulatory referral to Physical Therapy    Referral Priority:   Routine    Referral Type:  Physical Medicine    Referral Reason:   Specialty Services Required    Requested Specialty:   Physical Therapy   No orders of the defined types were placed in this encounter.   Discussed warning signs or symptoms. Please see discharge instructions. Patient expresses understanding.

## 2018-08-04 ENCOUNTER — Ambulatory Visit: Payer: Medicare Other | Admitting: Adult Health

## 2018-08-04 ENCOUNTER — Telehealth: Payer: Self-pay | Admitting: *Deleted
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
## 2018-08-05 ENCOUNTER — Encounter: Payer: Self-pay | Admitting: Adult Health

## 2018-08-06 ENCOUNTER — Encounter: Payer: Self-pay | Admitting: Osteopathic Medicine

## 2018-08-06 ENCOUNTER — Ambulatory Visit (INDEPENDENT_AMBULATORY_CARE_PROVIDER_SITE_OTHER): Payer: Medicare Other | Admitting: Osteopathic Medicine

## 2018-08-06 VITALS — BP 116/80 | HR 91 | Temp 98.2°F | Wt 131.9 lb

## 2018-08-06 DIAGNOSIS — J069 Acute upper respiratory infection, unspecified: Secondary | ICD-10-CM | POA: Diagnosis not present

## 2018-08-06 DIAGNOSIS — B9789 Other viral agents as the cause of diseases classified elsewhere: Secondary | ICD-10-CM

## 2018-08-06 DIAGNOSIS — F411 Generalized anxiety disorder: Secondary | ICD-10-CM | POA: Diagnosis not present

## 2018-08-06 MED ORDER — IPRATROPIUM BROMIDE 0.06 % NA SOLN: 2.0000 | Freq: Four times a day (QID) | NASAL | 1 refills | Status: DC

## 2018-08-06 MED ORDER — LIDOCAINE VISCOUS HCL 2 % MT SOLN: 5.0000 mL | OROMUCOSAL | 1 refills | Status: DC | PRN

## 2018-08-06 MED ORDER — AMOXICILLIN-POT CLAVULANATE 875-125 MG PO TABS: 1.0000 | ORAL_TABLET | Freq: Two times a day (BID) | ORAL | 0 refills | Status: DC

## 2018-08-06 NOTE — Progress Notes (Signed)
HPI: Holly [name] is a 42 y.o. female who  has a past medical history of Depression, Fibromyalgia (2018), GERD (gastroesophageal reflux disease), Hemiplegia (HCC), IBS (irritable bowel syndrome), Migraine, MRSA (methicillin resistant staph aureus) culture positive, and Vaginal Pap smear, abnormal.  she presents to [facility] today, 08/06/18,  for chief complaint of: Follow-up anxiety Upper respiratory infection  Anxiety/insomnia is doing a lot better, she saw me a few weeks ago and we opted to try a short course of clonazepam nightly to help with sleep.  She states that she is overall sleeping a lot better with the exception of her upper respiratory illness which has been going on for about a week and a half at this point, greatest complaint is sinus congestion and sore throat.     Past medical, surgical, social and family history reviewed:  Patient Active Problem List   Diagnosis Date Noted  . Rib pain on right side 02/26/2018  . Menometrorrhagia 01/20/2018  . Dysmenorrhea 01/20/2018  . Fibromyalgia syndrome 09/30/2017  . Acute pain of left knee 05/27/2017  . 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 Abnormal Pap smear of cervix 02/26/2017  . Depression, recurrent (HCC) 01/09/2017  . Chronic seasonal allergic rhinitis 11/27/2016  . Intractable chronic migraine without aura and with status migrainosus 04/08/2016    Past Surgical History:  Procedure Laterality Date  . WRIST SURGERY     cyst removal    Social History   Tobacco Use  . Smoking status: Never Smoker  . Smokeless tobacco: Never Used  Substance Use Topics  . Alcohol use: No    Alcohol/week: 0.0 standard drinks    Comment: none    Family History  Problem Relation Age of Onset  . Thyroid disease Mother   . Scoliosis Mother   . Depression Mother   . Migraines Mother   . Hyperlipidemia Mother   . Hypertension Mother   . Heart attack Mother   . Crohn's disease Father   .  Autoimmune disease Father   . Factor V Leiden deficiency Father   . Heart failure Father   . Diabetes Father   . Hyperlipidemia Father   . Heart attack Father   . Cancer Sister   . Depression Sister   . Diabetes Sister   . Diabetes Paternal Grandfather      Current medication list and allergy/intolerance information reviewed:    Current Outpatient Medications  Medication Sig Dispense Refill  . butalbital-acetaminophen-caffeine (FIORICET, ESGIC) 50-325-40 MG tablet TAKE 1 TABLET BY MOUTH EVERY 6 HOURS AS NEEDED FOR HEADACHE 15 tablet 1  . celecoxib (CELEBREX) 100 MG capsule Take 1 capsule (100 mg total) by mouth 2 (two) times daily. 60 capsule 1  . clonazePAM (KLONOPIN) 0.5 MG tablet Take 0.5-1 tablets (0.25-0.5 mg total) by mouth 2 (two) times daily as needed for anxiety. Sparing use to prevent tolerance/dependence 15 tablet 1  . diclofenac sodium (VOLTAREN) 1 % GEL Apply 4 g topically 4 (four) times daily. To affected area. 100 g 11  . Galcanezumab-gnlm (EMGALITY) 120 MG/ML SOAJ Inject 120 mg into the skin every 30 (thirty) days. 1 pen 11  . ibuprofen (IBU) 800 MG tablet Take 1 tablet (800 mg total) by mouth every 8 (eight) hours as needed for mild pain or moderate pain. 90 tablet 1  . montelukast (SINGULAIR) 10 MG tablet Take 1 tablet (10 mg) by mouth daily in the evening 90 tablet 3  . norethindrone (MICRONOR,CAMILA,ERRIN) 0.35 MG tablet Take 1 tablet (  0.35 mg total) by mouth daily. 28 tablet 11  . omeprazole (PRILOSEC) 40 MG capsule Take 1 capsule (40 mg total) by mouth daily. 90 capsule 3  . ondansetron (ZOFRAN) 8 MG tablet Take by mouth.    . ondansetron (ZOFRAN-ODT) 4 MG disintegrating tablet Take 1 tablet (4 mg total) by mouth every 8 (eight) hours as needed for nausea. 30 tablet 11  . prochlorperazine (COMPAZINE) 10 MG tablet Take 1 tablet (10 mg total) by mouth every 6 (six) hours as needed. 30 tablet 11  . QC LORATADINE-D 10-240 MG 24 hr tablet Take 1 tablet by mouth daily. 30  tablet 11  . sertraline (ZOLOFT) 50 MG tablet Take 1 tablet (50 mg) by mouth daily 90 tablet 3  . TROKENDI XR 200 MG CP24 Take 1 capsule by mouth at bedtime. 30 capsule 11  . amoxicillin-clavulanate (AUGMENTIN) 875-125 MG tablet Take 1 tablet by mouth 2 (two) times daily. 14 tablet 0  . ipratropium (ATROVENT) 0.06 % nasal spray Place 2 sprays into both nostrils 4 (four) times daily. 15 mL 1  . lidocaine (XYLOCAINE) 2 % solution Use as directed 5-10 mLs in the mouth or throat every 3 (three) hours as needed (mouth/throat pain - gargle and spit). 100 mL 1   No current facility-administered medications for this visit.     Allergies  Allergen Reactions  . Other Rash    Overly sleepy  . Terbutaline Palpitations  . Sumatriptan   . Adhesive [Tape] Itching and Rash  . Aimovig [Erenumab-Aooe] Rash  . Celebrex [Celecoxib] Rash    03/17/18 patient is unsure, thinks it may have been shaving cream  . Cetirizine Hcl Other (See Comments)    Overly sleepy   . Ciprofloxacin Rash      Review of Systems:  Constitutional:  No  fever, no chills, +recent illness, No unintentional weight changes. +significant fatigue.   HEENT: +headache, no vision change, no hearing change, +sore throat, +sinus pressure  Cardiac: No  chest pain, No  pressure, No palpitations  Respiratory:  No  shortness of breath. +Cough  Gastrointestinal: No  abdominal pain, No  nausea, No  vomiting,  No  blood in stool, No  diarrhea  Musculoskeletal: No new myalgia/arthralgia  Skin: No  Rash  Neurologic: No  weakness, No  dizziness  Exam:  BP 116/80 (BP Location: Left Arm, Patient Position: Sitting, Cuff Size: Normal)   Pulse 91   Temp 98.2 F (36.8 C) (Oral)   Wt 131 lb 14.4 oz (59.8 kg)   BMI 20.81 kg/m   Constitutional: VS see above. General Appearance: alert, well-developed, well-nourished, NAD  Eyes: Normal lids and conjunctive, non-icteric sclera  Ears, Nose, Mouth, Throat: MMM, Normal external inspection  ears/nares/mouth/lips/gums. TM normal bilaterally. Pharynx/tonsils +erythema, no exudate. Nasal mucosa normal.   Neck: No masses, trachea midline. No tenderness/mass appreciated. No lymphadenopathy  Respiratory: Normal respiratory effort. no wheeze, no rhonchi, no rales  Cardiovascular: S1/S2 normal, no murmur, no rub/gallop auscultated. RRR. No lower extremity edema.   Gastrointestinal: Nontender, no masses. Bowel sounds normal.  Musculoskeletal: Gait normal.   Neurological: Normal balance/coordination. No tremor.   Skin: warm, dry, intact.   Psychiatric: Normal judgment/insight. Normal mood and affect.   Depression screen [facility] 2/9 08/06/2018 07/17/2018 07/05/2017  Decreased Interest 1 1 1   Down, Depressed, Hopeless 1 1 0  PHQ - 2 Score 2 2 1   Altered sleeping 0 3 -  Tired, decreased energy 1 2 -  Change in appetite 2 1 -  Feeling bad or failure about yourself  0 0 -  Trouble concentrating 0 1 -  Moving slowly or fidgety/restless 2 3 -  Suicidal thoughts 0 0 -  PHQ-9 Score 7 12 -  Difficult doing work/chores - Somewhat difficult -   GAD 7 : Generalized Anxiety Score 08/06/2018 07/17/2018 04/01/2017  Nervous, Anxious, on Edge 1 2 2   Control/stop worrying 1 3 2   Worry too much - different things 2 3 2   Trouble relaxing 2 3 2   Restless 1 1 1   Easily annoyed or irritable 1 3 3   Afraid - awful might happen 1 2 3   Total GAD 7 Score 9 17 15   Anxiety Difficulty - Extremely difficult -                    ASSESSMENT/PLAN:   Viral URI with cough - Trial symptomatic treatment, would treat with antibiotics for sinusitis if no improvement  Generalized anxiety disorder - Am okay to refill the clonazepam as needed, will limit supply to avoid dependence/tolerance    Patient Instructions  Medications & Home Remedies for Upper Respiratory Illness   Note: the following list assumes no pregnancy, normal liver & kidney function and no other drug interactions. Dr. Lyn Hollingshead  has highlighted medications which are safe for you to use, but these may not be appropriate for everyone. Always ask a pharmacist or qualified medical provider if you have any questions!    Aches/Pains, Fever, Headache OTC Acetaminophen (Tylenol) 500 mg tablets - take max 2 tablets (1000 mg) every 6 hours (4 times per day)  OTC Ibuprofen (Motrin) 200 mg tablets - take max 4 tablets (800 mg) every 6 hours*   Sinus Congestion Prescription Atrovent as directed OTC Nasal Saline if desired to rinse OTC Oxymetolazone (Afrin, others) sparing use due to rebound congestion, NEVER use in kids OTC Phenylephrine (Sudafed) 10 mg tablets every 4 hours (or the 12-hour formulation)* OTC Diphenhydramine (Benadryl) 25 mg tablets - take max 2 tablets every 4 hours   Cough & Sore Throat Prescription cough pills or syrups as directed OTC Dextromethorphan (Robitussin, others) - cough suppressant OTC Guaifenesin (Robitussin, Mucinex, others) - expectorant (helps cough up mucus) (Dextromethorphan and Guaifenesin also come in a combination tablet/syrup) OTC Lozenges w/ Benzocaine + Menthol (Cepacol) Prescription lidocaine syrup to help numb the throat Honey - as much as you want! Teas which "coat the throat" - look for ingredients Elm Bark, Licorice Root, Marshmallow Root   Other Prescription Oral Steroids to decrease inflammation and improve energy Prescription Antibiotics if these are necessary for bacterial infection - take ALL, even if you're feeling better  OTC Zinc Lozenges within 24 hours of symptoms onset - mixed evidence this shortens the duration of the common cold Don't waste your money on Vitamin C or Echinacea in acute illness - it's already too late!    *Caution in patients with high blood pressure       Visit summary with medication list and pertinent instructions was printed for patient to review. All questions at time of visit were answered - patient instructed to contact office with any  additional concerns. ER/RTC precautions were reviewed with the patient.   Follow-up plan: Return if symptoms worsen or fail to improve, toehrwise when due for annual wellness checkup or as-needed .    Please note: voice recognition software was used to produce this document, and typos may escape review. Please contact Dr. Lyn Hollingshead for any needed clarifications.

## 2018-08-06 NOTE — Patient Instructions (Addendum)
Medications & Home Remedies for Upper Respiratory Illness   Note: the following list assumes no pregnancy, normal liver & kidney function and no other drug interactions. Dr. Lyn HollingsheadAlexander has highlighted medications which are safe for you to use, but these may not be appropriate for everyone. Always ask a pharmacist or qualified medical provider if you have any questions!    Aches/Pains, Fever, Headache OTC Acetaminophen (Tylenol) 500 mg tablets - take max 2 tablets (1000 mg) every 6 hours (4 times per day)  OTC Ibuprofen (Motrin) 200 mg tablets - take max 4 tablets (800 mg) every 6 hours*   Sinus Congestion Prescription Atrovent as directed OTC Nasal Saline if desired to rinse OTC Oxymetolazone (Afrin, others) sparing use due to rebound congestion, NEVER use in kids OTC Phenylephrine (Sudafed) 10 mg tablets every 4 hours (or the 12-hour formulation)* OTC Diphenhydramine (Benadryl) 25 mg tablets - take max 2 tablets every 4 hours   Cough & Sore Throat Prescription cough pills or syrups as directed OTC Dextromethorphan (Robitussin, others) - cough suppressant OTC Guaifenesin (Robitussin, Mucinex, others) - expectorant (helps cough up mucus) (Dextromethorphan and Guaifenesin also come in a combination tablet/syrup) OTC Lozenges w/ Benzocaine + Menthol (Cepacol) Prescription lidocaine syrup to help numb the throat Honey - as much as you want! Teas which "coat the throat" - look for ingredients Elm Bark, Licorice Root, Marshmallow Root   Other Prescription Oral Steroids to decrease inflammation and improve energy Prescription Antibiotics if these are necessary for bacterial infection - take ALL, even if you're feeling better  OTC Zinc Lozenges within 24 hours of symptoms onset - mixed evidence this shortens the duration of the common cold Don't waste your money on Vitamin C or Echinacea in acute illness - it's already too late!    *Caution in patients with high blood pressure

## 2018-08-14 NOTE — Telephone Encounter (Signed)
Error

## 2018-09-04 ENCOUNTER — Other Ambulatory Visit: Payer: Self-pay | Admitting: Osteopathic Medicine

## 2018-09-04 DIAGNOSIS — F411 Generalized anxiety disorder: Secondary | ICD-10-CM

## 2018-09-05 NOTE — Telephone Encounter (Signed)
[facility] pharmacy requesting med refill for clonazepam.

## 2018-09-09 NOTE — Telephone Encounter (Signed)
Pt has been updated. No other inquiries during call.  

## 2018-09-17 ENCOUNTER — Ambulatory Visit: Payer: Medicare Other | Admitting: Neurology

## 2018-09-22 ENCOUNTER — Encounter: Payer: Self-pay | Admitting: Neurology

## 2018-09-22 ENCOUNTER — Encounter

## 2018-09-22 ENCOUNTER — Ambulatory Visit (INDEPENDENT_AMBULATORY_CARE_PROVIDER_SITE_OTHER): Payer: Medicare Other | Admitting: Neurology

## 2018-09-22 VITALS — BP 125/68 | HR 101 | Wt 134.0 lb

## 2018-09-22 DIAGNOSIS — M5481 Occipital neuralgia: Secondary | ICD-10-CM | POA: Diagnosis not present

## 2018-09-22 DIAGNOSIS — R51 Headache: Secondary | ICD-10-CM

## 2018-09-22 DIAGNOSIS — M542 Cervicalgia: Secondary | ICD-10-CM

## 2018-09-22 DIAGNOSIS — R519 Headache, unspecified: Secondary | ICD-10-CM

## 2018-09-22 DIAGNOSIS — G43711 Chronic migraine without aura, intractable, with status migrainosus: Secondary | ICD-10-CM

## 2018-09-22 NOTE — Progress Notes (Signed)
PATIENT: Holly [name] DOB: 21-May-1976  REASON FOR VISIT: follow up HISTORY FROM: patient  HISTORY OF PRESENT ILLNESS:    Tpday 09/22/2018: Difficulty with insomnia, melatonin is not working, she has been on everything and has neck pain, memory foam with ergonomic pillow. Her neck pain is  like someone hitting her in the back of the neck. She feels her mind is racing and keeps her awake but often it is the pain in the neck that stops her from sleeping. Discussed options, will Add botox on to the regimen. She has a lot pain and shooting/burning at the back of the neck possibly occipital neuralgia. She continues to have daily headaches. 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 No aura. No medication overuse. Can last 24-48 hours and are moderately severe to severe ongoing for years.   Today 09/25/18  Holly [name] is a 41 year old female with a history of migraine headaches.  She returns today for follow-up.  She remains on Trokendi and Fioricet.  She states that she has had 3 injections of Emgality.  She reports that her headache severity has definitely improved.  She continues to have a daily headache however since starting the injections her headaches are not lasting all day long.  She also reports that she is not having to go to urgent care as frequently as before.  She states that she has one severe migraine with hemiplegic symptoms at least once a week.  She states that she did have the LEAP procedure due to an abnormal Pap smear and she has significant bleeding afterwards and was placed on a progesterone tablet.  She reports for this reason her headaches were slightly worse during this time.  She has stopped the progesterone.  She also reports that with the injection she has noticed constipation within the first 10 days after the injection.  She states that she has tried Colace with no benefit.  She did try MiraLAX and that seemed to work better.  She returns today for  an evaluation.  HISTORY  09/16/2017: She has been diagnosed with fibromyalgia. She is allergic to Celebrex. She is seeing her pcp and they may start Lyrica, which is also a migraine medication. She saw another doctor, who is a good neurologist and I recommend following with them. She uses Fioricet which I don't prefer but it is the only think that helps her, she has been advised about rebound headache. She has also seen [facility]. Catalina Lunger at the [facility].   Interval History 08/07/2016:Holly A Garrettis a 42 y.o. femalehere as a referral from Dr. Alba Destine hemiplegic migraines. She has severe migraines what impair her both physicilly and cognitively, she can't even remember her own name, she can;t walk straight, her balance is impaired, she has slurred speech, she stutters, can;t take care of her 6 children or at times herself when recurrence of migraines. 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 The last several have been extreme in her severity. She has gone to the emergency room multiple times for severe refractory migraines. Patient's migraines have improved with Topiramate but they are still occurring 3-4 daysa week and can be severe when she has a migraine 10 days in a row. She takes Topiramate 200mg  daily. She is breastfeeding. When she has migraines she is debilitated often and has to stay in bed in a dark  room all day and her husband has had to stay home. She has 15-17 migraine days in a month. She is weaning her last child this last month and her hormones are fluctuating. Will have the Botox representative call.   Meds tried: Topiramate,Amitrip, nortrip. Currently on topamax 200mg  daily. Also tried venlafaxine, imitrex, maxalt, treximet. Blood pressure medications are contraindicated due to her very low blood pressure.   [name] 05/29/2016: Today 04/29/2016: [name] is a 42 year old female with a history of migraine headaches. She returns today for follow-up. She states that her headaches have improved with Topamax. She states she typically has 1-2 headaches a week. Before she was having 3-5 headaches a week. She is currently taken Topamax 150 mg at bedtime. She reports that with her migraines she is experiencing weakness on the right side typically in the arm but it can move to the leg. She also noticed slurring speech and stuttering. In the past the symptoms have been worrisome and she went to the emergency room thinking she was having a stroke. She was told that it was migraine headache. She reports that the symptoms never occur without a migraine. She uses caffeine, Tylenol, Advil and Fioricet in that order to treat her acute headaches. In the past she has tried triptans and Cambia with minimal benefit. She states baclofen was helpful for her headaches however it stopped her milk production. She plans to continue breast-feeding to the child is 51 months old. She returns today for an evaluation.    HPI 04/06/2016:Holly A Garrettis a 43 y.o.femalehere as a referral from Dr. Duanne Guess. She has had migraines for 21 years. She has tried a lot of things. Migraines are affecting her speech and she is slurring stuttering with the migraines. She trained in nursing and psychology. She has daily headaches, migraines. They are on the left side 98% of the time, behind the eye, throbbing and pounding like a "pick axe", laying down in a dark room helps, endorses extreme sounds sensitivity, light sensitivity, smell triggers, no aura. 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 No medication overuse. She had extensive testing at the [facility] with MRIs and LPs. She has nausea and vomiting with the migraines. No inciting event, no head trauma, has a FHx of migraines. Lack of sleep worsens, drinks caffeine once a day. She drink enough fluid. She was bedridden for  a whole year with migraines. She has daily migraines that can last all day.No other focal neurologic deficits. She does have musculoskeletal neck pain.  She has tried everything: Only Fioricet and Vicodin helps.That is the only thing she takes. She tried Amitrip, nortrip. Currently on topamax 75mg  daily. Also tried venlafaxine, imitrex, maxalt, treximet.   Never tried propranolol  Reviewed notes, labs and imaging from outside physicians, which showed:  CT if the head 2004: CLINICAL DATA: HEADACHE. RIGHT AND LEFT-SIDED NUMBNESS OF ARMS AND LEGS. BLURRED VISION FOR TWO WEEKS. TWO MONTHS POST PARTUM. PREVIOUS MRI OF THE BRAIN IN 1998.CT SCAN OF THE HEAD WITHOUT CONTRAST WITH BONE WINDOWS A SERIES OF SCANS OF THE ENTIRE HEAD ARE MADE WITHOUT CONTRAST AND SHOW NO EVIDENCE OF INTRACRANIAL MASS OR HEMORRHAGE. THERE IS NO SHIFT OF MIDLINE STRUCTURES. THE VENTRICULAR SYSTEM APPEARS NORMAL. BONE WINDOWS SHOW THE PARANASAL SINUSES, BASE OF THE SKULL, BONY CALVARIUM, AND INTERNAL AUDITORY CANALS TO BE NORMAL. IMPRESSION NORMAL CT SCAN OF THE HEAD WITHOUT CONTRAST.  RPR, HIV negative.   Reviewed notes from OBGyn: Pt has had migraines for 20  years. Patient reports continuous migraine for 3 months. She is on Topamax 75mg  qhs. She has tunnel vision with migraines. Cries unconsolably with blood in her tears. She has diarrhea with the Topamax but wants to continue it. She is nursing her baby and is almost done, decreased sleep and fatigue is worsening migraines. 5 trips to the ER in the last 6 weeks. Imitrex gave her a panic attack and numbness in the face. Exam was unremarkable. On Zofran and Topiramate.    REVIEW OF SYSTEMS: Out of a complete 14 system review of symptoms, the patient complains only of the following symptoms, and all other reviewed systems are negative.  Fatigue, ringing in ears, constipation, insomnia, frequent waking her back pain, bruise/bleed easily  ALLERGIES: Allergies    Allergen Reactions  . Other Rash    Overly sleepy  . Terbutaline Palpitations  . Sumatriptan   . Adhesive [Tape] Itching and Rash  . Aimovig [Erenumab-Aooe] Rash  . Celebrex [Celecoxib] Rash    03/17/18 patient is unsure, thinks it may have been shaving cream  . Cetirizine Hcl Other (See Comments)    Overly sleepy   . Ciprofloxacin Rash    HOME MEDICATIONS: Outpatient Medications Prior to Visit  Medication Sig Dispense Refill  . celecoxib (CELEBREX) 100 MG capsule Take 1 capsule (100 mg total) by mouth 2 (two) times daily. 60 capsule 1  . clonazePAM (KLONOPIN) 0.5 MG tablet TAKE 1/2-1 TABLET BY MOUTH TWICE DAILY AS NEEDED FOR ANXIETY. SPARING USE TO PREVENT TOLERANCE/DEPENDENCE 15 tablet 0  . Galcanezumab-gnlm (EMGALITY) 120 MG/ML SOAJ Inject 120 mg into the skin every 30 (thirty) days. 1 pen 11  . ibuprofen (IBU) 800 MG tablet Take 1 tablet (800 mg total) by mouth every 8 (eight) hours as needed for mild pain or moderate pain. 90 tablet 1  . ipratropium (ATROVENT) 0.06 % nasal spray Place 2 sprays into both nostrils 4 (four) times daily. 15 mL 1  . lidocaine (XYLOCAINE) 2 % solution Use as directed 5-10 mLs in the mouth or throat every 3 (three) hours as needed (mouth/throat pain - gargle and spit). 100 mL 1  . montelukast (SINGULAIR) 10 MG tablet Take 1 tablet (10 mg) by mouth daily in the evening 90 tablet 3  . norethindrone (MICRONOR,CAMILA,ERRIN) 0.35 MG tablet Take 1 tablet (0.35 mg total) by mouth daily. 28 tablet 11  . omeprazole (PRILOSEC) 40 MG capsule Take 1 capsule (40 mg total) by mouth daily. 90 capsule 3  . ondansetron (ZOFRAN) 8 MG tablet Take by mouth.    . ondansetron (ZOFRAN-ODT) 4 MG disintegrating tablet Take 1 tablet (4 mg total) by mouth every 8 (eight) hours as needed for nausea. 30 tablet 11  . prochlorperazine (COMPAZINE) 10 MG tablet Take 1 tablet (10 mg total) by mouth every 6 (six) hours as needed. 30 tablet 11  . QC LORATADINE-D 10-240 MG 24 hr tablet Take  1 tablet by mouth daily. 30 tablet 11  . sertraline (ZOLOFT) 50 MG tablet Take 1 tablet (50 mg) by mouth daily 90 tablet 3  . TROKENDI XR 200 MG CP24 Take 1 capsule by mouth at bedtime. 30 capsule 11  . butalbital-acetaminophen-caffeine (FIORICET, ESGIC) 50-325-40 MG tablet TAKE 1 TABLET BY MOUTH EVERY 6 HOURS AS NEEDED FOR HEADACHE 15 tablet 1  . amoxicillin-clavulanate (AUGMENTIN) 875-125 MG tablet Take 1 tablet by mouth 2 (two) times daily. 14 tablet 0  . diclofenac sodium (VOLTAREN) 1 % GEL Apply 4 g topically 4 (four) times daily. To  affected area. 100 g 11   No facility-administered medications prior to visit.     PAST MEDICAL HISTORY: Past Medical History:  Diagnosis Date  . Depression   . Fibromyalgia 2018  . GERD (gastroesophageal reflux disease)   . Hemiplegia (HCC)   . IBS (irritable bowel syndrome)   . Migraine   . MRSA (methicillin resistant staph aureus) culture positive   . Vaginal Pap smear, abnormal     PAST SURGICAL HISTORY: Past Surgical History:  Procedure Laterality Date  . WRIST SURGERY     cyst removal    FAMILY HISTORY: Family History  Problem Relation Age of Onset  . Thyroid disease Mother   . Scoliosis Mother   . Depression Mother   . Migraines Mother   . Hyperlipidemia Mother   . Hypertension Mother   . Heart attack Mother   . Crohn's disease Father   . Autoimmune disease Father   . Factor V Leiden deficiency Father   . Heart failure Father   . Diabetes Father   . Hyperlipidemia Father   . Heart attack Father   . Cancer Sister   . Depression Sister   . Diabetes Sister   . Diabetes Paternal Grandfather     SOCIAL HISTORY: Social History   Socioeconomic History  . Marital status: Married    Spouse name: Not on file  . Number of children: 6  . Years of education: Not on file  . Highest education level: Not on file  Occupational History  . Occupation: Arts development officer  Social Needs  . Financial resource strain: Not on file  . Food  insecurity:    Worry: Not on file    Inability: Not on file  . Transportation needs:    Medical: Not on file    Non-medical: Not on file  Tobacco Use  . Smoking status: Never Smoker  . Smokeless tobacco: Never Used  Substance and Sexual Activity  . Alcohol use: No    Alcohol/week: 0.0 standard drinks    Comment: none  . Drug use: No  . Sexual activity: Yes    Birth control/protection: Pill  Lifestyle  . Physical activity:    Days per week: Not on file    Minutes per session: Not on file  . Stress: Not on file  Relationships  . Social connections:    Talks on phone: Not on file    Gets together: Not on file    Attends religious service: Not on file    Active member of club or organization: Not on file    Attends meetings of clubs or organizations: Not on file    Relationship status: Not on file  . Intimate partner violence:    Fear of current or ex partner: Not on file    Emotionally abused: Not on file    Physically abused: Not on file    Forced sexual activity: Not on file  Other Topics Concern  . Not on file  Social History Narrative   1 caffeine drink a day at max   Right handed   Lives at home with children. Husband is a Naval architect and rarely home.      PHYSICAL EXAM  Vitals:   09/22/18 1549  BP: 125/68  Pulse: (!) 101  Weight: 134 lb (60.8 kg)   Body mass index is 21.14 kg/m.  Generalized: Well developed, in no acute distress   Neurological examination  Mentation: Alert oriented to time, place, history taking. Follows all commands  speech and language fluent Cranial nerve II-XII: Pupils were equal round reactive to light. Extraocular movements were full, visual field were full on confrontational test. Facial sensation and strength were normal. Uvula tongue midline. Head turning and shoulder shrug  were normal and symmetric. Motor: The motor testing reveals 5 over 5 strength of all 4 extremities. Good symmetric motor tone is noted throughout.    Sensory: Sensory testing is intact to soft touch on all 4 extremities. No evidence of extinction is noted.  Coordination: Cerebellar testing reveals good finger-nose-finger and heel-to-shin bilaterally.  Gait and station: Gait is normal. Tandem gait is normal. Romberg is negative. No drift is seen.  Reflexes: Deep tendon reflexes are symmetric and normal bilaterally.   DIAGNOSTIC DATA (LABS, IMAGING, TESTING) - I reviewed patient records, labs, notes, testing and imaging myself where available.  Lab Results  Component Value Date   WBC 5.2 03/05/2018   HGB 12.2 03/05/2018   HCT 35.0 03/05/2018   MCV 89.5 03/05/2018   PLT 237 03/05/2018      Component Value Date/Time   NA 140 01/21/2018 0935   K 4.1 01/21/2018 0935   CL 111 (H) 01/21/2018 0935   CO2 24 01/21/2018 0935   GLUCOSE 94 01/21/2018 0935   BUN 9 01/21/2018 0935   CREATININE 1.02 01/21/2018 0935   CALCIUM 8.9 01/21/2018 0935   PROT 6.7 01/21/2018 0935   ALBUMIN 4.2 02/12/2017 1510   AST 11 01/21/2018 0935   ALT 6 01/21/2018 0935   ALKPHOS 56 02/12/2017 1510   BILITOT 0.5 01/21/2018 0935   GFRNONAA 69 01/21/2018 0935   GFRAA 80 01/21/2018 0935   Lab Results  Component Value Date   CHOL 109 01/21/2018   HDL 43 (L) 01/21/2018   LDLCALC 53 01/21/2018   TRIG 51 01/21/2018   CHOLHDL 2.5 01/21/2018    Lab Results  Component Value Date   TSH 1.40 01/21/2018      ASSESSMENT AND PLAN 42 y.o. year old female  has a past medical history of Depression, Fibromyalgia (2018), GERD (gastroesophageal reflux disease), Hemiplegia (HCC), IBS (irritable bowel syndrome), Migraine, MRSA (methicillin resistant staph aureus) culture positive, and Vaginal Pap smear, abnormal. here with:  1.  Migraine headaches  - The patient will continue on Trokendi and Fioricet.  She will continue with Emgality injections.  She is advised that she should keep a headache journal.  If her symptoms worsen or she develops new symptoms she should  let us know.  We will see her back in 4 months or sooner if needed.  - start Botox for migraines  2. Neck pain: Occipital nerve irritation/neuralgia. Will refer to Dr. Alvester Morin for occipital procedures to help with neck pain.  Orders Placed This Encounter  Procedures  . Ambulatory referral to Orthopedic Surgery   A total of 25 minutes was spent face-to-face with this patient. Over half this time was spent on counseling patient on the  1. Bilateral occipital neuralgia   2. Occipital headache   3. Neck pain     diagnosis and different diagnostic and therapeutic options, counseling and coordination of care, risks ans benefits of management, compliance, or risk factor reduction and education.      Naomie Dean, MD [facility] [address] [place], [place] [address] [phone]

## 2018-09-24 ENCOUNTER — Encounter: Payer: Self-pay | Admitting: Emergency Medicine

## 2018-09-24 ENCOUNTER — Other Ambulatory Visit: Payer: Self-pay

## 2018-09-24 ENCOUNTER — Other Ambulatory Visit: Payer: Self-pay | Admitting: Neurology

## 2018-09-24 ENCOUNTER — Emergency Department (INDEPENDENT_AMBULATORY_CARE_PROVIDER_SITE_OTHER)
Admission: EM | Admit: 2018-09-24 | Discharge: 2018-09-24 | Disposition: A | Discharge status: Home / Self Care | Payer: Medicare Other | Source: Home / Self Care | Attending: Family Medicine | Admitting: Family Medicine

## 2018-09-24 DIAGNOSIS — G43119 Migraine with aura, intractable, without status migrainosus: Secondary | ICD-10-CM

## 2018-09-24 MED ORDER — DEXAMETHASONE SODIUM PHOSPHATE 10 MG/ML IJ SOLN
10.0000 mg | Freq: Once | INTRAMUSCULAR | Status: AC
  Administered 2018-09-24: 10 mg via INTRAMUSCULAR

## 2018-09-24 MED ORDER — KETOROLAC TROMETHAMINE 60 MG/2ML IM SOLN
60.0000 mg | Freq: Once | INTRAMUSCULAR | Status: AC
  Administered 2018-09-24: 60 mg via INTRAMUSCULAR

## 2018-09-24 MED ORDER — METOCLOPRAMIDE HCL 5 MG/ML IJ SOLN
5.0000 mg | Freq: Once | INTRAMUSCULAR | Status: AC
  Administered 2018-09-24: 5 mg via INTRAMUSCULAR

## 2018-09-24 MED ORDER — BUTALBITAL-APAP-CAFFEINE 50-325-40 MG PO TABS: ORAL_TABLET | ORAL | 3 refills | Status: DC

## 2018-09-24 NOTE — ED Triage Notes (Signed)
Migraine 1 or 3 days not sure

## 2018-09-24 NOTE — ED Provider Notes (Signed)
[facility]    CSN: [id number] Arrival date & time: 09/24/18  1538     History   Chief Complaint Chief Complaint  Patient presents with  . Migraine    HPI Holly [name] is a 42 y.o. female.   HPI Holly [name] is a 42 y.o. female presenting to UC with hx of migraines c/o 2-3 days of worsening HA c/w prior migraines. Pain is aching and throbbing, 10/10.  She has tried her Fioricet around 1PM and tried to take a nap but "could hear people walking on gravel outside" and her kids in the other room despite two doors being closed. Associated nausea and photophobia. Nausea has improved after taking Zofran.  Denies vomiting, fever, chills, cough, congestion, sore throat. She is currently in the process of scheduling an appointment for Botox injections for her recurrent migraines.  She has had "migraine cocktails" before when her headaches get this bad.   Past Medical History:  Diagnosis Date  . Depression   . Fibromyalgia 2018  . GERD (gastroesophageal reflux disease)   . Hemiplegia (HCC)   . IBS (irritable bowel syndrome)   . Migraine   . MRSA (methicillin resistant staph aureus) culture positive   . Vaginal Pap smear, abnormal     Patient Active Problem List   Diagnosis Date Noted  . Rib pain on right side 02/26/2018  . Menometrorrhagia 01/20/2018  . Dysmenorrhea 01/20/2018  . Fibromyalgia syndrome 09/30/2017  . Acute pain of left knee 05/27/2017  . 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 Abnormal Pap smear of cervix 02/26/2017  . Depression, recurrent (HCC) 01/09/2017  . Chronic seasonal allergic rhinitis 11/27/2016  . Intractable chronic migraine without aura and with status migrainosus 04/08/2016    Past Surgical History:  Procedure Laterality Date  . WRIST SURGERY     cyst removal    OB History    Gravida  6   Para  6   Term  2   Preterm  4   AB      Living  6     SAB      TAB      Ectopic      Multiple  0   Live Births  6           Home Medications    Prior to Admission medications   Medication Sig Start Date End Date Taking? Authorizing Provider  butalbital-acetaminophen-caffeine (FIORICET, ESGIC) 50-325-40 MG tablet TAKE 1 TABLET BY MOUTH EVERY 6 HOURS AS NEEDED FOR HEADACHE 09/24/18   Anson Fret, MD  celecoxib (CELEBREX) 100 MG capsule Take 1 capsule (100 mg total) by mouth 2 (two) times daily. 03/17/18   Sunnie Nielsen, DO  clonazePAM (KLONOPIN) 0.5 MG tablet TAKE 1/2-1 TABLET BY MOUTH TWICE DAILY AS NEEDED FOR ANXIETY. SPARING USE TO PREVENT TOLERANCE/DEPENDENCE 09/08/18   Sunnie Nielsen, DO  Galcanezumab-gnlm [facility]) 120 MG/ML SOAJ Inject 120 mg into the skin every 30 (thirty) days. 02/06/18   Anson Fret, MD  ibuprofen (IBU) 800 MG tablet Take 1 tablet (800 mg total) by mouth every 8 (eight) hours as needed for mild pain or moderate pain. 06/30/18   Sunnie Nielsen, DO  ipratropium (ATROVENT) 0.06 % nasal spray Place 2 sprays into both nostrils 4 (four) times daily. 08/06/18   Sunnie Nielsen, DO  lidocaine (XYLOCAINE) 2 % solution Use as directed 5-10 mLs in the mouth or throat every 3 (three) hours as needed (mouth/throat pain - gargle and spit). 08/06/18  Sunnie Nielsen, DO  montelukast (SINGULAIR) 10 MG tablet Take 1 tablet (10 mg) by mouth daily in the evening 06/19/17   Sunnie Nielsen, DO  norethindrone (MICRONOR,CAMILA,ERRIN) 0.35 MG tablet Take 1 tablet (0.35 mg total) by mouth daily. 03/21/18   Sunnie Nielsen, DO  omeprazole (PRILOSEC) 40 MG capsule Take 1 capsule (40 mg total) by mouth daily. 02/26/18   Sunnie Nielsen, DO  ondansetron (ZOFRAN) 8 MG tablet Take by mouth.    [provider]  ondansetron (ZOFRAN-ODT) 4 MG disintegrating tablet Take 1 tablet (4 mg total) by mouth every 8 (eight) hours as needed for nausea. 06/04/18   Anson Fret, MD  prochlorperazine (COMPAZINE) 10 MG tablet Take 1 tablet (10 mg total) by mouth every 6 (six) hours as  needed. 06/04/18   Anson Fret, MD  QC LORATADINE-D 10-240 MG 24 hr tablet Take 1 tablet by mouth daily. 10/14/17   Sunnie Nielsen, DO  sertraline (ZOLOFT) 50 MG tablet Take 1 tablet (50 mg) by mouth daily 06/19/17   Sunnie Nielsen, DO  TROKENDI XR 200 MG CP24 Take 1 capsule by mouth at bedtime. 03/17/18   [name], NP    Family History Family History  Problem Relation Age of Onset  . Thyroid disease Mother   . Scoliosis Mother   . Depression Mother   . Migraines Mother   . Hyperlipidemia Mother   . Hypertension Mother   . Heart attack Mother   . Crohn's disease Father   . Autoimmune disease Father   . Factor V Leiden deficiency Father   . Heart failure Father   . Diabetes Father   . Hyperlipidemia Father   . Heart attack Father   . Cancer Sister   . Depression Sister   . Diabetes Sister   . Diabetes Paternal Grandfather     Social History Social History   Tobacco Use  . Smoking status: Never Smoker  . Smokeless tobacco: Never Used  Substance Use Topics  . Alcohol use: No    Alcohol/week: 0.0 standard drinks    Comment: none  . Drug use: No     Allergies   Other; Terbutaline; Sumatriptan; Adhesive [tape]; Aimovig [erenumab-aooe]; Celebrex [celecoxib]; Cetirizine hcl; and Ciprofloxacin   Review of Systems Review of Systems  Constitutional: Negative for chills and fever.  Eyes: Positive for photophobia. Negative for pain and discharge.  Gastrointestinal: Positive for nausea. Negative for vomiting.  Musculoskeletal: Negative for neck pain and neck stiffness.  Skin: Negative for rash.  Neurological: Positive for headaches. Negative for dizziness and light-headedness.     Physical Exam Triage Vital Signs ED Triage Vitals  Enc Vitals Group     BP 09/24/18 1551 111/77     Pulse Rate 09/24/18 1551 91     Resp --      Temp 09/24/18 1551 98.3 F (36.8 C)     Temp Source 09/24/18 1551 Oral     SpO2 09/24/18 1551 100 %     Weight 09/24/18 1552  130 lb (59 kg)     Height 09/24/18 1552 5\' 7"  (1.702 m)     Head Circumference --      Peak Flow --      Pain Score 09/24/18 1551 10     Pain Loc --      Pain Edu? --      Excl. in GC? --    No data found.  Updated Vital Signs BP 111/77 (BP Location: Right Arm)   Pulse 91  Temp 98.3 F (36.8 C) (Oral)   Ht 5\' 7"  (1.702 m)   Wt 130 lb (59 kg)   SpO2 100%   BMI 20.36 kg/m   Visual Acuity Right Eye Distance:   Left Eye Distance:   Bilateral Distance:    Right Eye Near:   Left Eye Near:    Bilateral Near:     Physical Exam  Constitutional: She is oriented to person, place, and time. She appears well-developed and well-nourished. No distress.  HENT:  Head: Normocephalic and atraumatic.  Right Ear: Tympanic membrane normal.  Left Ear: Tympanic membrane normal.  Nose: Nose normal. Right sinus exhibits no maxillary sinus tenderness and no frontal sinus tenderness. Left sinus exhibits no maxillary sinus tenderness and no frontal sinus tenderness.  Mouth/Throat: Uvula is midline, oropharynx is clear and moist and mucous membranes are normal.  Eyes: Pupils are equal, round, and reactive to light. Conjunctivae and EOM are normal. Right eye exhibits no discharge. Left eye exhibits no discharge.  Neck: Normal range of motion. Neck supple.  Cardiovascular: Normal rate and regular rhythm.  Pulmonary/Chest: Effort normal and breath sounds normal. No stridor. No respiratory distress. She has no wheezes. She has no rales.  Musculoskeletal: Normal range of motion.  Neurological: She is alert and oriented to person, place, and time. She has normal strength. No cranial nerve deficit or sensory deficit. She displays a negative Romberg sign. GCS eye subscore is 4. GCS verbal subscore is 5. GCS motor subscore is 6.  Skin: Skin is warm and dry. Capillary refill takes less than 2 seconds. She is not diaphoretic.  Psychiatric: She has a normal mood and affect. Her behavior is normal.  Nursing  note and vitals reviewed.    UC Treatments / Results  Labs (all labs ordered are listed, but only abnormal results are displayed) Labs Reviewed - No data to display  EKG None  Radiology No results found.  Procedures Procedures (including critical care time)  Medications Ordered in UC Medications  ketorolac (TORADOL) injection 60 mg (60 mg Intramuscular Given 09/24/18 1617)  metoCLOPramide (REGLAN) injection 5 mg (5 mg Intramuscular Given 09/24/18 1617)  dexamethasone (DECADRON) injection 10 mg (10 mg Intramuscular Given 09/24/18 1617)    Initial Impression / Assessment and Plan / UC Course  I have reviewed the triage vital signs and the nursing notes.  Pertinent labs & imaging results that were available during my care of the patient were reviewed by me and considered in my medical decision making (see chart for details).     HA c/w prior migraines. Migraine cocktail given in UC. HA improved from 10/10 to 8/10 Pt feels comfortable being discharged home.  Encouraged f/u with her PCP and neurologist    Final Clinical Impressions(s) / UC Diagnoses   Final diagnoses:  Intractable migraine with aura without status migrainosus   Discharge Instructions   None    ED Prescriptions    None     Controlled Substance Prescriptions [place] Controlled Substance Registry consulted? Not Applicable   Rolla Plate 09/24/18 1942

## 2018-10-03 ENCOUNTER — Other Ambulatory Visit: Payer: Self-pay | Admitting: Osteopathic Medicine

## 2018-10-15 ENCOUNTER — Telehealth: Payer: Self-pay | Admitting: Neurology

## 2018-10-15 NOTE — Telephone Encounter (Signed)
I called to check pre cert requirements for [phone] and [phone]- the representative stated that they do not require authorization and are eligible for B/B [phone]#[id number]

## 2018-10-20 ENCOUNTER — Encounter: Payer: Self-pay | Admitting: *Deleted

## 2018-10-20 ENCOUNTER — Emergency Department (INDEPENDENT_AMBULATORY_CARE_PROVIDER_SITE_OTHER)
Admission: EM | Admit: 2018-10-20 | Discharge: 2018-10-20 | Disposition: A | Discharge status: Home / Self Care | Payer: Medicare Other | Source: Home / Self Care | Attending: Family Medicine | Admitting: Family Medicine

## 2018-10-20 ENCOUNTER — Other Ambulatory Visit: Payer: Self-pay

## 2018-10-20 DIAGNOSIS — J029 Acute pharyngitis, unspecified: Secondary | ICD-10-CM

## 2018-10-20 DIAGNOSIS — J302 Other seasonal allergic rhinitis: Secondary | ICD-10-CM

## 2018-10-20 DIAGNOSIS — B9789 Other viral agents as the cause of diseases classified elsewhere: Secondary | ICD-10-CM

## 2018-10-20 DIAGNOSIS — J069 Acute upper respiratory infection, unspecified: Secondary | ICD-10-CM

## 2018-10-20 LAB — POCT RAPID STREP A (OFFICE): Rapid Strep A Screen: NEGATIVE

## 2018-10-20 MED ORDER — AMOXICILLIN 875 MG PO TABS: 875.0000 mg | ORAL_TABLET | Freq: Two times a day (BID) | ORAL | 0 refills | Status: DC

## 2018-10-20 MED ORDER — PREDNISONE 20 MG PO TABS: ORAL_TABLET | ORAL | 0 refills | Status: DC

## 2018-10-20 NOTE — ED Triage Notes (Signed)
Pt c/o nasal congestion x 2- 3 wks, RT ear ache x 2 days, sore throat x last night. Denies fever.

## 2018-10-20 NOTE — Discharge Instructions (Addendum)
Take plain guaifenesin (1200mg  extended release tabs such as Mucinex) twice daily, with plenty of water, for cough and congestion.  May add Pseudoephedrine (30mg , one or two every 4 to 6 hours) for sinus congestion.  Get adequate rest.   May use Afrin nasal spray (or generic oxymetazoline) each morning for about 5 days and then discontinue.  Also recommend using saline nasal spray several times daily and saline nasal irrigation (AYR is a common brand).  Use Flonase nasal spray each morning after using Afrin nasal spray and saline nasal irrigation. Try warm salt water gargles for sore throat.  Stop all antihistamines for now, and other non-prescription cough/cold preparations. May take Delsym Cough Suppressant at bedtime for nighttime cough.  Begin Amoxicillin if not improving about one week or if persistent fever develops   Recommend a flu shot when well.

## 2018-10-20 NOTE — ED Provider Notes (Signed)
[facility]    CSN: [id number] Arrival date & time: 10/20/18  0950     History   Chief Complaint Chief Complaint  Patient presents with  . Nasal Congestion    HPI Holly [name] is a 42 y.o. female.   Patient has a history of perennial rhinitis which has been active during the past 3 weeks.  However, during the past several days her nasal drainage has become thicker and not responded to her Claritin D.  She developed a mild headache about 3 days ago, and her right ear has felt clogged for several days.  Her posterior neck became sore about 4 days ago.  Today she awoke at 2am with a severe sore throat.  She has also noticed a mild cough today. She reports that her colds tend to linger, and she has had frequent episodes of sinusitis in the past.  She has a family history of asthma (mother, sister, children).  The history is provided by the patient.    Past Medical History:  Diagnosis Date  . Depression   . Fibromyalgia 2018  . GERD (gastroesophageal reflux disease)   . Hemiplegia (HCC)   . IBS (irritable bowel syndrome)   . Migraine   . MRSA (methicillin resistant staph aureus) culture positive   . Vaginal Pap smear, abnormal     Patient Active Problem List   Diagnosis Date Noted  . Rib pain on right side 02/26/2018  . Menometrorrhagia 01/20/2018  . Dysmenorrhea 01/20/2018  . Fibromyalgia syndrome 09/30/2017  . Acute pain of left knee 05/27/2017  . 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 Abnormal Pap smear of cervix 02/26/2017  . Depression, recurrent (HCC) 01/09/2017  . Chronic seasonal allergic rhinitis 11/27/2016  . Intractable chronic migraine without aura and with status migrainosus 04/08/2016    Past Surgical History:  Procedure Laterality Date  . WRIST SURGERY     cyst removal    OB History    Gravida  6   Para  6   Term  2   Preterm  4   AB      Living  6     SAB      TAB      Ectopic      Multiple  0   Live Births  6           Home Medications    Prior to Admission medications   Medication Sig Start Date End Date Taking? Authorizing Provider  amoxicillin (AMOXIL) 875 MG tablet Take 1 tablet (875 mg total) by mouth 2 (two) times daily. (Rx void after 10/28/18) 10/20/18   Lattie HawBeese, Stephen A, MD  butalbital-acetaminophen-caffeine (FIORICET, ESGIC) [phone] MG tablet TAKE 1 TABLET BY MOUTH EVERY 6 HOURS AS NEEDED FOR HEADACHE 09/24/18   Anson FretAhern, Antonia B, MD  celecoxib (CELEBREX) 100 MG capsule Take 1 capsule (100 mg total) by mouth 2 (two) times daily. 03/17/18   Sunnie NielsenAlexander, Natalie, DO  clonazePAM (KLONOPIN) 0.5 MG tablet TAKE 1/2-1 TABLET BY MOUTH TWICE DAILY AS NEEDED FOR ANXIETY. SPARING USE TO PREVENT TOLERANCE/DEPENDENCE 09/08/18   Sunnie NielsenAlexander, Natalie, DO  Galcanezumab-gnlm [facility](EMGALITY) 120 MG/ML SOAJ Inject 120 mg into the skin every 30 (thirty) days. 02/06/18   Anson FretAhern, Antonia B, MD  ibuprofen (IBU) 800 MG tablet Take 1 tablet (800 mg total) by mouth every 8 (eight) hours as needed for mild pain or moderate pain. 06/30/18   Sunnie NielsenAlexander, Natalie, DO  ipratropium (ATROVENT) 0.06 % nasal spray Place 2 sprays into both  nostrils 4 (four) times daily. 08/06/18   Sunnie Nielsen, DO  lidocaine (XYLOCAINE) 2 % solution Use as directed 5-10 mLs in the mouth or throat every 3 (three) hours as needed (mouth/throat pain - gargle and spit). 08/06/18   Sunnie Nielsen, DO  montelukast (SINGULAIR) 10 MG tablet Take 1 tablet (10 mg) by mouth daily in the evening 06/19/17   Sunnie Nielsen, DO  norethindrone (MICRONOR,CAMILA,ERRIN) 0.35 MG tablet Take 1 tablet (0.35 mg total) by mouth daily. 03/21/18   Sunnie Nielsen, DO  omeprazole (PRILOSEC) 40 MG capsule Take 1 capsule (40 mg total) by mouth daily. 02/26/18   Sunnie Nielsen, DO  ondansetron (ZOFRAN) 8 MG tablet Take by mouth.    [provider]  ondansetron (ZOFRAN-ODT) 4 MG disintegrating tablet Take 1 tablet (4 mg total) by mouth every 8 (eight) hours as needed  for nausea. 06/04/18   Anson Fret, MD  predniSONE (DELTASONE) 20 MG tablet Take one tab by mouth twice daily for 5 days, then one daily for 3 days. Take with food. 10/20/18   Lattie Haw, MD  prochlorperazine (COMPAZINE) 10 MG tablet Take 1 tablet (10 mg total) by mouth every 6 (six) hours as needed. 06/04/18   Anson Fret, MD  QC LORATADINE-D 10-240 MG 24 hr tablet Take 1 tablet by mouth daily. 10/14/17   Sunnie Nielsen, DO  sertraline (ZOLOFT) 50 MG tablet TAKE ONE TABLET DAILY 10/06/18   Sunnie Nielsen, DO  TROKENDI XR 200 MG CP24 Take 1 capsule by mouth at bedtime. 03/17/18   [name], NP    Family History Family History  Problem Relation Age of Onset  . Thyroid disease Mother   . Scoliosis Mother   . Depression Mother   . Migraines Mother   . Hyperlipidemia Mother   . Hypertension Mother   . Heart attack Mother   . Crohn's disease Father   . Autoimmune disease Father   . Factor V Leiden deficiency Father   . Heart failure Father   . Diabetes Father   . Hyperlipidemia Father   . Heart attack Father   . Cancer Sister   . Depression Sister   . Diabetes Sister   . Diabetes Paternal Grandfather     Social History Social History   Tobacco Use  . Smoking status: Never Smoker  . Smokeless tobacco: Never Used  Substance Use Topics  . Alcohol use: No    Alcohol/week: 0.0 standard drinks    Comment: none  . Drug use: No     Allergies   Other; Terbutaline; Sumatriptan; Adhesive [tape]; Aimovig [erenumab-aooe]; Celebrex [celecoxib]; Cetirizine hcl; and Ciprofloxacin   Review of Systems Review of Systems + sore throat + cough No pleuritic pain No wheezing + nasal congestion + post-nasal drainage No sinus pain/pressure No itchy/red eyes ? earache No hemoptysis No SOB No fever, + chills/sweats No nausea No vomiting No abdominal pain No diarrhea No urinary symptoms No skin rash + fatigue + myalgias + headache Used OTC meds without  relief   Physical Exam Triage Vital Signs ED Triage Vitals  Enc Vitals Group     BP 10/20/18 1019 116/74     Pulse Rate 10/20/18 1019 100     Resp 10/20/18 1019 18     Temp 10/20/18 1019 98.4 F (36.9 C)     Temp Source 10/20/18 1019 Oral     SpO2 10/20/18 1019 100 %     Weight 10/20/18 1020 135 lb (61.2 kg)  Height 10/20/18 1020 5\' 7"  (1.702 m)     Head Circumference --      Peak Flow --      Pain Score 10/20/18 1020 0     Pain Loc --      Pain Edu? --      Excl. in GC? --    No data found.  Updated Vital Signs BP 116/74 (BP Location: Right Arm)   Pulse 100   Temp 98.4 F (36.9 C) (Oral)   Resp 18   Ht 5\' 7"  (1.702 m)   Wt 61.2 kg   SpO2 100%   BMI 21.14 kg/m   Visual Acuity Right Eye Distance:   Left Eye Distance:   Bilateral Distance:    Right Eye Near:   Left Eye Near:    Bilateral Near:     Physical Exam Nursing notes and Vital Signs reviewed. Appearance:  Patient appears stated age, and in no acute distress Eyes:  Pupils are equal, round, and reactive to light and accomodation.  Extraocular movement is intact.  Conjunctivae are not inflamed  Ears:  Canals normal.  Tympanic membranes normal.  Nose:  Mildly congested turbinates.  No sinus tenderness.   Pharynx:  Minimal erythema. Neck:  Supple.  Enlarged posterior/lateral nodes are palpated bilaterally, tender to palpation on the left.   Lungs:  Clear to auscultation.  Breath sounds are equal.  Moving air well. Heart:  Regular rate and rhythm without murmurs, rubs, or gallops.  Abdomen:  Nontender without masses or hepatosplenomegaly.  Bowel sounds are present.  No CVA or flank tenderness.  Extremities:  No edema.  Skin:  No rash present.    UC Treatments / Results  Labs (all labs ordered are listed, but only abnormal results are displayed) Labs Reviewed  STREP A DNA PROBE  POCT RAPID STREP A (OFFICE) negative    EKG None  Radiology No results found.  Procedures Procedures (including  critical care time)  Medications Ordered in UC Medications - No data to display  Initial Impression / Assessment and Plan / UC Course  I have reviewed the triage vital signs and the nursing notes.  Pertinent labs & imaging results that were available during my care of the patient were reviewed by me and considered in my medical decision making (see chart for details).    There is no evidence of bacterial infection today.  Treat symptomatically for now  With patient's history of seasonal rhinitis, family history of asthma, and past history of viral URI's that linger, she probably has a predisposition to mild reactive airways disease.  She now has congestion of a viral URI superimposed on her usual seasonal rhinitis.    Will begin prednisone burst/taper. Followup with Family Doctor if not improved in about 7 to 10 days.   Final Clinical Impressions(s) / UC Diagnoses   Final diagnoses:  Acute pharyngitis, unspecified etiology  Viral URI with cough  Seasonal allergic rhinitis, unspecified trigger     Discharge Instructions     Take plain guaifenesin (1200mg  extended release tabs such as Mucinex) twice daily, with plenty of water, for cough and congestion.  May add Pseudoephedrine (30mg , one or two every 4 to 6 hours) for sinus congestion.  Get adequate rest.   May use Afrin nasal spray (or generic oxymetazoline) each morning for about 5 days and then discontinue.  Also recommend using saline nasal spray several times daily and saline nasal irrigation (AYR is a common brand).  Use Flonase nasal spray  each morning after using Afrin nasal spray and saline nasal irrigation. Try warm salt water gargles for sore throat.  Stop all antihistamines for now, and other non-prescription cough/cold preparations. May take Delsym Cough Suppressant at bedtime for nighttime cough.  Begin Amoxicillin if not improving about one week or if persistent fever develops   Recommend a flu shot when  well.     ED Prescriptions    Medication Sig Dispense Auth. Provider   predniSONE (DELTASONE) 20 MG tablet Take one tab by mouth twice daily for 5 days, then one daily for 3 days. Take with food. 13 tablet Lattie Haw, MD   amoxicillin (AMOXIL) 875 MG tablet Take 1 tablet (875 mg total) by mouth 2 (two) times daily. (Rx void after 10/28/18) 20 tablet Lattie Haw, MD         Lattie Haw, MD 10/20/18 [id number]

## 2018-10-21 ENCOUNTER — Telehealth: Payer: Self-pay | Admitting: Neurology

## 2018-10-21 ENCOUNTER — Ambulatory Visit (INDEPENDENT_AMBULATORY_CARE_PROVIDER_SITE_OTHER): Payer: Medicare Other | Admitting: Physical Medicine and Rehabilitation

## 2018-10-21 ENCOUNTER — Ambulatory Visit (INDEPENDENT_AMBULATORY_CARE_PROVIDER_SITE_OTHER): Payer: Self-pay

## 2018-10-21 ENCOUNTER — Telehealth: Payer: Self-pay

## 2018-10-21 ENCOUNTER — Encounter (INDEPENDENT_AMBULATORY_CARE_PROVIDER_SITE_OTHER): Payer: Self-pay | Admitting: Physical Medicine and Rehabilitation

## 2018-10-21 VITALS — BP 122/82 | HR 93

## 2018-10-21 DIAGNOSIS — M797 Fibromyalgia: Secondary | ICD-10-CM

## 2018-10-21 DIAGNOSIS — G43419 Hemiplegic migraine, intractable, without status migrainosus: Secondary | ICD-10-CM | POA: Diagnosis not present

## 2018-10-21 DIAGNOSIS — G43711 Chronic migraine without aura, intractable, with status migrainosus: Secondary | ICD-10-CM | POA: Diagnosis not present

## 2018-10-21 DIAGNOSIS — M5481 Occipital neuralgia: Secondary | ICD-10-CM | POA: Diagnosis not present

## 2018-10-21 LAB — STREP A DNA PROBE: Group A Strep Probe: NOT DETECTED

## 2018-10-21 NOTE — Telephone Encounter (Signed)
Pt requesting a call(or message per mychart) stating she has a cold with swollen nodes, stating she feels fine and isn't running a fever. Wanting to make sure it would still be alright for her to get her botox injection tomorrow. Please advise

## 2018-10-21 NOTE — Telephone Encounter (Signed)
Left message, advised strep culture negative. Call if pt has questions.

## 2018-10-21 NOTE — Telephone Encounter (Signed)
Spoke with patient, gave lab results.  Will follow up as needed. 

## 2018-10-21 NOTE — Progress Notes (Signed)
Numeric Pain Rating Scale and Functional Assessment Average Pain 8 Pain Right Now 2 My pain is intermittent, sharp, stabbing and tingling Pain is worse with: lying flat on back Pain improves with: pressure, weighted eye mask, sleeping on left side   In the last MONTH (on 0-10 scale) has pain interfered with the following?  1. General activity like being  able to carry out your everyday physical activities such as walking, climbing stairs, carrying groceries, or moving a chair?  Rating(10)  2. Relation with others like being able to carry out your usual social activities and roles such as  activities at home, at work and in your community. Rating(10)  3. Enjoyment of life such that you have  been bothered by emotional problems such as feeling anxious, depressed or irritable?  Rating(9)

## 2018-10-21 NOTE — Telephone Encounter (Signed)
Spoke with patient and advised her that Dr. Lucia GaskinsAhern would like for her to r/s her appt. Pt agreed and preferred to come later next week. Offered Fri 12/13 appt but pt unable to make that one. Pt took a Wed 10/29/18 appt @ 11:30 arrival 11:00. Pt will call back Monday or Tuesday of next week to r/s if she is not better. She had no further questions or concerns.

## 2018-10-21 NOTE — Telephone Encounter (Signed)
Per Dr. Lucia GaskinsAhern, would like pt to r/s botox appointment.

## 2018-10-22 ENCOUNTER — Telehealth: Payer: Self-pay | Admitting: Neurology

## 2018-10-22 ENCOUNTER — Other Ambulatory Visit: Payer: Self-pay | Admitting: Osteopathic Medicine

## 2018-10-22 ENCOUNTER — Encounter (INDEPENDENT_AMBULATORY_CARE_PROVIDER_SITE_OTHER): Payer: Self-pay | Admitting: Physical Medicine and Rehabilitation

## 2018-10-22 ENCOUNTER — Ambulatory Visit: Payer: Medicare Other | Admitting: Neurology

## 2018-10-22 DIAGNOSIS — J302 Other seasonal allergic rhinitis: Secondary | ICD-10-CM

## 2018-10-22 NOTE — Telephone Encounter (Signed)
Botox B/B. covered 100 percent with the medicaid.  ZOX#[id number]#[id number]

## 2018-10-22 NOTE — Progress Notes (Signed)
Holly [name] - 42 y.o. female MRN [id number]  Date of birth: 31-Oct-1976  Office Visit Note: Visit Date: 10/21/2018 PCP: Sunnie NielsenAlexander, Natalie, DO Referred by: Sunnie NielsenAlexander, Natalie, DO  Subjective: Chief Complaint  Patient presents with  . Head - Pain  . Neck - Pain   HPI: Holly [name] is a 42 y.o. female who comes in today At the request of Dr. Naomie DeanAntonia Ahern for evaluation and management of upper cervical neck pain with potential trigger for intractable hemiplegic migraine.  Patient has a significant family history of migraine headache and has had significant disability from her migraine.  She reports predominantly left-sided pain over the eye which can refer back to the neck and she gets a good area of pain in the upper cervical spine at that point.  She also has of late is had a lot of tension in the upper neck.  She reports having 6 children also causes some tension at times and worsening neck pain.  She has been getting more pain in the back of the head recently.  She really has tried all manner of care for her migraine headaches through Dr. Daisy BlossomAhearn and even [facility]  Recently there is been discussion about adding Botox injections for prophylactic migraine suppression and she is scheduled to potentially start that tomorrow.  She does get numbness and tingling on the right side at times she also gets stuttering and slurred speech with her migraine.  She feels like it is an ice pick in the left eye to the back of the head.  It even causes some cognitive difficulties.  Over the last couple years she has been diagnosed with fibromyalgia as well.  She has had no imaging of the cervical spine.  She did have a normal brain MRI fairly recently.  She rates her pain as an 8 out of 10.  She has gotten better with Dr. Darreld McleanAhearn's treatment and she reports at one point having several weeks where she was doing fairly well but unfortunately now she is just down to having a few  hours a day where she is not having a lot of pain.  Pain is worse with lying flat on her back.  She says the pain improves with pressure and a weighted I mask and sleeping on the left side.  Medications have been difficult for her she has a history of not responding well to most medications.  She is a little apprehensive about any injections of the spine because of history of lumbar puncture when she was younger and prolonged issue with spinal headache that just seemingly would not go away.  She has not had any other injections of the cervical spine or cervical spine surgery.  She has no radicular complaints.  She does have pain with extension of the neck at times and increased tension.  She is tried anti-inflammatories and physical therapy.  She has not noted any specific focal weakness.  Review of Systems  Constitutional: Positive for malaise/fatigue. Negative for chills, fever and weight loss.  HENT: Negative for hearing loss and sinus pain.   Eyes: Negative for blurred vision, double vision and photophobia.  Respiratory: Negative for cough and shortness of breath.   Cardiovascular: Negative for chest pain, palpitations and leg swelling.  Gastrointestinal: Negative for abdominal pain, nausea and vomiting.  Genitourinary: Negative for flank pain.  Musculoskeletal: Positive for neck pain. Negative for myalgias.  Skin: Negative for itching and rash.  Neurological: Positive for headaches.  Negative for tremors, focal weakness and weakness.  Endo/Heme/Allergies: Negative.   Psychiatric/Behavioral: Negative for depression.  All other systems reviewed and are negative.  Otherwise per HPI.  Assessment & Plan: Visit Diagnoses:  1. Bilateral occipital neuralgia   2. Intractable chronic migraine without aura and with status migrainosus   3. Intractable hemiplegic migraine without status migrainosus   4. Fibromyalgia syndrome     Plan: Findings:  Chronic intractable migraine headaches with a mixture  of some tension type issues around the cervical spine which may be a trigger.  She is actually gotten improvement with her migraine headaches through Dr. Darreld Mclean treatment.  She notes Dr. Lucia Gaskins notes that she is going to start Botox treatment perhaps tomorrow.  She has had a recent pharyngitis and is hopeful that they could still do the Botox injections.  In terms of her neck the cervical spine films are pretty benign but she could still have issues of the upper cervical region and the third occipital nerve being a trigger for potential migraines.  I think at this point we are both in agreement that she wants to start the Botox treatment see how that does first and at any time during that trial if it did not help or was not helping enough she is going to call us back and I do think she might benefit from diagnostic C2-3 facet joint/medial branch blocks with potential for radiofrequency ablation.  This was discussed with her at length.  She did have a lot of concerns about the fact that she had a prior spinal tap when she was much younger and it took almost a year for the headache to go away from the dural puncture.  I went over the mechanisms and anatomy of the medial branch blocks and if sure we would not be getting any spinal tap at that point and she seemed to be relieved about this.  I will see her back depending on her treatment with Dr. Daisy Blossom and will be happy to see her back again.    Meds & Orders: No orders of the defined types were placed in this encounter.   Orders Placed This Encounter  Procedures  . XR Cervical Spine 2 or 3 views    Follow-up: Return if symptoms worsen or fail to improve.   Procedures: No procedures performed  No notes on file   Clinical History: No specialty comments available.   She reports that she has never smoked. She has never used smokeless tobacco. No results for input(s): HGBA1C, LABURIC in the last 8760 hours.  Objective:  VS:  HT:    WT:   BMI:      BP:122/82  HR:93bpm  TEMP: ( )  RESP:  Physical Exam  Constitutional: She is oriented to person, place, and time. She appears well-developed and well-nourished. No distress.  HENT:  Head: Normocephalic and atraumatic.  Nose: Nose normal.  Mouth/Throat: Oropharynx is clear and moist.  Eyes: Pupils are equal, round, and reactive to light. Conjunctivae are normal.  Neck: Neck supple. No JVD present. No tracheal deviation present.  Decreased range of motion with right rotation giving her more left-sided pain  Cardiovascular: Regular rhythm and intact distal pulses.  Pulmonary/Chest: Effort normal. No respiratory distress.  Abdominal: She exhibits no distension. There is no guarding.  Musculoskeletal:  Examination of the cervical spine shows fairly normal anatomic alignment with slight forward flexion.  She does have tight paraspinal musculature of the longissimus as well as tight muscles around the  trapezius bilaterally.  She does not have any large focal trigger points but there is tenderness throughout.  She has no shoulder impingement.  She has good strength in the upper extremities bilaterally without any deficits.  She has a negative Hoffman's test bilaterally and she has good reflexes at the biceps and brachioradialis bilaterally and symmetric.  She does have decreased range of motion with right rotation more than the left.  She has a negative Tinel's over the occipital nerve.  Neurological: She is alert and oriented to person, place, and time. She exhibits normal muscle tone. Coordination normal.  Skin: Skin is warm. No rash noted. No erythema.  Psychiatric: She has a normal mood and affect. Her behavior is normal.  Nursing note and vitals reviewed.   Ortho Exam Imaging: Xr Cervical Spine 2 Or 3 Views  Result Date: 10/22/2018 AP and lateral cervical spine show some rotatory scoliosis in the upper thoracic levels which is mild.  Otherwise normal anatomic alignment with good cervical  lordosis without listhesis.  Disc heights are well-maintained.  There is some mild facet arthropathy at C5-6.   Past Medical/Family/Surgical/Social History: Medications & Allergies reviewed per EMR, new medications updated. Patient Active Problem List   Diagnosis Date Noted  . Rib pain on right side 02/26/2018  . Menometrorrhagia 01/20/2018  . Dysmenorrhea 01/20/2018  . Fibromyalgia syndrome 09/30/2017  . Acute pain of left knee 05/27/2017  . 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 Abnormal Pap smear of cervix 02/26/2017  . Depression, recurrent (HCC) 01/09/2017  . Chronic seasonal allergic rhinitis 11/27/2016  . Intractable chronic migraine without aura and with status migrainosus 04/08/2016   Past Medical History:  Diagnosis Date  . Depression   . Fibromyalgia 2018  . GERD (gastroesophageal reflux disease)   . Hemiplegia (HCC)   . IBS (irritable bowel syndrome)   . Migraine   . MRSA (methicillin resistant staph aureus) culture positive   . Vaginal Pap smear, abnormal    Family History  Problem Relation Age of Onset  . Thyroid disease Mother   . Scoliosis Mother   . Depression Mother   . Migraines Mother   . Hyperlipidemia Mother   . Hypertension Mother   . Heart attack Mother   . Crohn's disease Father   . Autoimmune disease Father   . Factor V Leiden deficiency Father   . Heart failure Father   . Diabetes Father   . Hyperlipidemia Father   . Heart attack Father   . Cancer Sister   . Depression Sister   . Diabetes Sister   . Diabetes Paternal Grandfather    Past Surgical History:  Procedure Laterality Date  . WRIST SURGERY     cyst removal   Social History   Occupational History  . Occupation: Home Maker  Tobacco Use  . Smoking status: Never Smoker  . Smokeless tobacco: Never Used  Substance and Sexual Activity  . Alcohol use: No    Alcohol/week: 0.0 standard drinks    Comment: none  . Drug use: No  . Sexual activity: Yes    Birth control/protection:  Pill

## 2018-10-29 ENCOUNTER — Ambulatory Visit (INDEPENDENT_AMBULATORY_CARE_PROVIDER_SITE_OTHER): Payer: Medicare Other | Admitting: Neurology

## 2018-10-29 DIAGNOSIS — G43711 Chronic migraine without aura, intractable, with status migrainosus: Secondary | ICD-10-CM

## 2018-10-29 NOTE — Progress Notes (Signed)
Botox- 100 units x 2 vials Lot: [id number] Expiration: 03/2021 NDC: 0023-1145-01  Bacteriostatic 0.9% Sodium Chloride- 4mL total Lot: [id number] Expiration: 08/20/2019 NDC: 0409-1966-02  Dx: G43.711 B/B   

## 2018-10-29 NOTE — Progress Notes (Signed)
Consent Form Botulism Toxin Injection For Chronic Migraine  This is our first botox.  Reviewed orally with patient, additionally signature is on file:  Botulism toxin has been approved by the Federal drug administration for treatment of chronic migraine. Botulism toxin does not cure chronic migraine and it may not be effective in some patients.  The administration of botulism toxin is accomplished by injecting a small amount of toxin into the muscles of the neck and head. Dosage must be titrated for each individual. Any benefits resulting from botulism toxin tend to wear off after 3 months with a repeat injection required if benefit is to be maintained. Injections are usually done every 3-4 months with maximum effect peak achieved by about 2 or 3 weeks. Botulism toxin is expensive and you should be sure of what costs you will incur resulting from the injection.  The side effects of botulism toxin use for chronic migraine may include:   -Transient, and usually mild, facial weakness with facial injections  -Transient, and usually mild, head or neck weakness with head/neck injections  -Reduction or loss of forehead facial animation due to forehead muscle weakness  -Eyelid drooping  -Dry eye  -Pain at the site of injection or bruising at the site of injection  -Double vision  -Potential unknown long term risks  Contraindications: You should not have Botox if you are pregnant, nursing, allergic to albumin, have an infection, skin condition, or muscle weakness at the site of the injection, or have myasthenia gravis, Lambert-Eaton syndrome, or ALS.  It is also possible that as with any injection, there may be an allergic reaction or no effect from the medication. Reduced effectiveness after repeated injections is sometimes seen and rarely infection at the injection site may occur. All care will be taken to prevent these side effects. If therapy is given over a long time, atrophy and wasting in  the muscle injected may occur. Occasionally the patient's become refractory to treatment because they develop antibodies to the toxin. In this event, therapy needs to be modified.  I have read the above information and consent to the administration of botulism toxin.    BOTOX PROCEDURE NOTE FOR MIGRAINE HEADACHE    Contraindications and precautions discussed with patient(above). Aseptic procedure was observed and patient tolerated procedure. Procedure performed by Dr. Artemio Aly  The condition has existed for more than 6 months, and pt does not have a diagnosis of ALS, Myasthenia Gravis or Lambert-Eaton Syndrome.  Risks and benefits of injections discussed and pt agrees to proceed with the procedure.  Written consent obtained  These injections are medically necessary. Pt  receives good benefits from these injections. These injections do not cause sedations or hallucinations which the oral therapies may cause.  Description of procedure:  The patient was placed in a sitting position. The standard protocol was used for Botox as follows, with 5 units of Botox injected at each site:   -Procerus muscle, midline injection  -Corrugator muscle, bilateral injection  -Frontalis muscle, bilateral injection, with 2 sites each side, medial injection was performed in the upper one third of the frontalis muscle, in the region vertical from the medial inferior edge of the superior orbital rim. The lateral injection was again in the upper one third of the forehead vertically above the lateral limbus of the cornea, 1.5 cm lateral to the medial injection site.  - Levator Scapulae: 5 units bilaterally  -Temporalis muscle injection, 5 sites, bilaterally. The first injection was 3 cm above the  tragus of the ear, second injection site was 1.5 cm to 3 cm up from the first injection site in line with the tragus of the ear. The third injection site was 1.5-3 cm forward between the first 2 injection sites. The  fourth injection site was 1.5 cm posterior to the second injection site. 5th site laterally in the temporalis  muscleat the level of the outer canthus.  - Patient feels her clenching is a trigger for headaches. +5 units masseter bilaterally   - Patient feels the migraines are centered around the eyes +5 units bilaterally at the outer canthus in the orbicularis occuli  -Occipitalis muscle injection, 3 sites, bilaterally. The first injection was done one half way between the occipital protuberance and the tip of the mastoid process behind the ear. The second injection site was done lateral and superior to the first, 1 fingerbreadth from the first injection. The third injection site was 1 fingerbreadth superiorly and medially from the first injection site.  -Cervical paraspinal muscle injection, 2 sites, bilateral knee first injection site was 1 cm from the midline of the cervical spine, 3 cm inferior to the lower border of the occipital protuberance. The second injection site was 1.5 cm superiorly and laterally to the first injection site.  -Trapezius muscle injection was performed at 3 sites, bilaterally. The first injection site was in the upper trapezius muscle halfway between the inflection point of the neck, and the acromion. The second injection site was one half way between the acromion and the first injection site. The third injection was done between the first injection site and the inflection point of the neck.   Will return for repeat injection in 3 months.   A 200 unit sof Botox was used, any Botox not injected was wasted. The patient tolerated the procedure well, there were no complications of the above procedure.

## 2018-11-19 HISTORY — PX: LEEP: SHX91

## 2018-11-26 ENCOUNTER — Encounter: Payer: Self-pay | Admitting: Family Medicine

## 2018-11-26 ENCOUNTER — Ambulatory Visit (INDEPENDENT_AMBULATORY_CARE_PROVIDER_SITE_OTHER): Payer: Medicare Other | Admitting: Family Medicine

## 2018-11-26 VITALS — BP 119/66 | HR 92 | Ht 67.0 in | Wt 138.0 lb

## 2018-11-26 DIAGNOSIS — S29019A Strain of muscle and tendon of unspecified wall of thorax, initial encounter: Secondary | ICD-10-CM | POA: Diagnosis not present

## 2018-11-26 MED ORDER — CYCLOBENZAPRINE HCL 5 MG PO TABS: 5.0000 mg | ORAL_TABLET | Freq: Three times a day (TID) | ORAL | 1 refills | Status: DC | PRN

## 2018-11-26 NOTE — Patient Instructions (Signed)
Thank you for coming in today. Use tylenol or ibuprofen for pain as needed.  Use a heating pad.  Use a TENS unit.  Attend physical therapy if not improving.   I will try to order a TENS unit through your insurance.  If you have trouble getting it use  TENS UNIT: This is helpful for muscle pain and spasm.   Search and Purchase a TENS 7000 2nd edition at  www.tenspros.com or www.Amazon.com It should be less than $30.     TENS unit instructions: Do not shower or bathe with the unit on Turn the unit off before removing electrodes or batteries If the electrodes lose stickiness add a drop of water to the electrodes after they are disconnected from the unit and place on plastic sheet. If you continued to have difficulty, call the TENS unit company to purchase more electrodes. Do not apply lotion on the skin area prior to use. Make sure the skin is clean and dry as this will help prolong the life of the electrodes. After use, always check skin for unusual red areas, rash or other skin difficulties. If there are any skin problems, does not apply electrodes to the same area. Never remove the electrodes from the unit by pulling the wires. Do not use the TENS unit or electrodes other than as directed. Do not change electrode placement without consultating your therapist or physician. Keep 2 fingers with between each electrode. Wear time ratio is 2:1, on to off times.    For example on for 30 minutes off for 15 minutes and then on for 30 minutes off for 15 minutes     Come back or go to the emergency room if you notice new weakness new numbness problems walking or bowel or bladder problems.

## 2018-11-26 NOTE — Progress Notes (Signed)
Holly [name] is a 43 y.o. female who presents to [facility] today for left thoracic back pain.  Symptoms present over the last week or so.  She denies any injury.  She notes pain located in the left lower thoracic paraspinal musculature.  Pain is worse with truncal motion and arm motion.  She denies any radiating pain weakness or numbness of bowel bladder dysfunction.  She is tried some over-the-counter medications for pain which helped a bit.  Additionally she is been using topical CBD which has been helpful as well.  She notes the pain is moderate and has been interfering with sleep a bit.  She is been trying some treatment also with some home exercise program and heating pad.  This is helped a bit.  Pain is interfering with activities around the house such as putting away her Christmas decorations.    ROS:  As above  Exam:  BP 119/66   Pulse 92   Ht 5\' 7"  (1.702 m)   Wt 138 lb (62.6 kg)   BMI 21.61 kg/m  Wt Readings from Last 5 Encounters:  11/26/18 138 lb (62.6 kg)  10/20/18 135 lb (61.2 kg)  09/24/18 130 lb (59 kg)  09/22/18 134 lb (60.8 kg)  08/06/18 131 lb 14.4 oz (59.8 kg)   General: Well Developed, well nourished, and in no acute distress.  Neuro/Psych: Alert and oriented x3, extra-ocular muscles intact, able to move all 4 extremities, sensation grossly intact. Skin: Warm and dry, no rashes noted.  Respiratory: Not using accessory muscles, speaking in full sentences, trachea midline.  Cardiovascular: Pulses palpable, no extremity edema. Abdomen: Does not appear distended. MSK:  C-spine nontender to midline.  Normal neck motion T-spine nontender to spinal midline.  Tender palpation left lower thoracic paraspinal musculature. L-spine nontender throughout lumbar spine. Normal lumbar motion. Some pain with trunk flexion and rotation present located in the left thoracic spinal area. Upper and lower extremity strength reflexes  and sensation are equal and intact throughout. Normal gait.    Lab and Radiology Results Xr Cervical Spine 2 Or 3 Views  Result Date: 10/22/2018 AP and lateral cervical spine show some rotatory scoliosis in the upper thoracic levels which is mild.  Otherwise normal anatomic alignment with good cervical lordosis without listhesis.  Disc heights are well-maintained.  There is some mild facet arthropathy at C5-6. \ I personally (independently) visualized and performed the interpretation of the images attached in this note.     Assessment and Plan: 43 y.o. female with thoracic paraspinal musculature pain due to myofascial spasm and strain.  Plan for heating pad TENS unit home exercise program cyclobenzaprine and prescription strength NSAIDs.  Plan to refer to physical therapy especially if this patient is not improving.  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  Recheck in a few weeks if not improving.   PDMP not reviewed this encounter. Orders Placed This Encounter  Procedures  . Ambulatory referral to Physical Therapy    Referral Priority:   Routine    Referral Type:   Physical Medicine    Referral Reason:   Specialty Services Required    Requested Specialty:   Physical Therapy   Meds ordered this encounter  Medications  . cyclobenzaprine (FLEXERIL) 5 MG tablet    Sig: Take 1-2 tablets (5-10 mg total) by mouth 3 (three) times daily as needed for muscle spasms.    Dispense:  60  tablet    Refill:  1    Historical information moved to improve visibility of documentation.  Past Medical History:  Diagnosis Date  . Depression   . Fibromyalgia 2018  . GERD (gastroesophageal reflux disease)   . Hemiplegia (HCC)   . IBS (irritable bowel syndrome)   . Migraine   . MRSA (methicillin resistant staph aureus) culture positive   . Vaginal Pap smear, abnormal    Past Surgical History:  Procedure Laterality Date  . WRIST  SURGERY     cyst removal   Social History   Tobacco Use  . Smoking status: Never Smoker  . Smokeless tobacco: Never Used  Substance Use Topics  . Alcohol use: No    Alcohol/week: 0.0 standard drinks    Comment: none   family history includes Autoimmune disease in her father; Cancer in her sister; Crohn's disease in her father; Depression in her mother and sister; Diabetes in her father, paternal grandfather, and sister; Factor V Leiden deficiency in her father; Heart attack in her father and mother; Heart failure in her father; Hyperlipidemia in her father and mother; Hypertension in her mother; Migraines in her mother; Scoliosis in her mother; Thyroid disease in her mother.  Medications: Current Outpatient Medications  Medication Sig Dispense Refill  . butalbital-acetaminophen-caffeine (FIORICET, ESGIC) 50-325-40 MG tablet TAKE 1 TABLET BY MOUTH EVERY 6 HOURS AS NEEDED FOR HEADACHE 15 tablet 3  . Galcanezumab-gnlm (EMGALITY) 120 MG/ML SOAJ Inject 120 mg into the skin every 30 (thirty) days. 1 pen 11  . ibuprofen (IBU) 800 MG tablet Take 1 tablet (800 mg total) by mouth every 8 (eight) hours as needed for mild pain or moderate pain. 90 tablet 1  . montelukast (SINGULAIR) 10 MG tablet Take 1 tablet (10 mg) by mouth daily in the evening 90 tablet 3  . norethindrone (MICRONOR,CAMILA,ERRIN) 0.35 MG tablet Take 1 tablet (0.35 mg total) by mouth daily. 28 tablet 11  . omeprazole (PRILOSEC) 40 MG capsule Take 1 capsule (40 mg total) by mouth daily. 90 capsule 3  . ondansetron (ZOFRAN) 8 MG tablet Take by mouth.    . ondansetron (ZOFRAN-ODT) 4 MG disintegrating tablet Take 1 tablet (4 mg total) by mouth every 8 (eight) hours as needed for nausea. 30 tablet 11  . prochlorperazine (COMPAZINE) 10 MG tablet Take 1 tablet (10 mg total) by mouth every 6 (six) hours as needed. 30 tablet 11  . QC LORATADINE-D 10-240 MG 24 hr tablet TAKE ONE TABLET DAILY 30 tablet 11  . sertraline (ZOLOFT) 50 MG tablet  TAKE ONE TABLET DAILY 90 tablet 0  . TROKENDI XR 200 MG CP24 Take 1 capsule by mouth at bedtime. 30 capsule 11  . cyclobenzaprine (FLEXERIL) 5 MG tablet Take 1-2 tablets (5-10 mg total) by mouth 3 (three) times daily as needed for muscle spasms. 60 tablet 1   No current facility-administered medications for this visit.    Allergies  Allergen Reactions  . Other Rash    Overly sleepy  . Terbutaline Palpitations  . Sumatriptan   . Adhesive [Tape] Itching and Rash  . Aimovig [Erenumab-Aooe] Rash  . Celebrex [Celecoxib] Rash    03/17/18 patient is unsure, thinks it may have been shaving cream  . Cetirizine Hcl Other (See Comments)    Overly sleepy   . Ciprofloxacin Rash      Discussed warning signs or symptoms. Please see discharge instructions. Patient expresses understanding.

## 2018-12-11 ENCOUNTER — Telehealth: Payer: Self-pay | Admitting: *Deleted

## 2018-12-11 NOTE — Telephone Encounter (Signed)
We received an early refill request for Butalbital-Acetaminophen-Caffeine. I called [facility]. They had filled it as a 4 day supply and the patient filled this three times in December and has used all of her refills totaling 45 tablets in December. Pharmacy staff aware this will need to be discussed before any further refills can be given.

## 2018-12-12 ENCOUNTER — Encounter: Payer: Self-pay | Admitting: Osteopathic Medicine

## 2018-12-12 ENCOUNTER — Ambulatory Visit (INDEPENDENT_AMBULATORY_CARE_PROVIDER_SITE_OTHER): Payer: Medicare Other | Admitting: Osteopathic Medicine

## 2018-12-12 VITALS — BP 112/67 | HR 94 | Temp 98.2°F | Wt 136.6 lb

## 2018-12-12 DIAGNOSIS — R599 Enlarged lymph nodes, unspecified: Secondary | ICD-10-CM | POA: Diagnosis not present

## 2018-12-12 DIAGNOSIS — R238 Other skin changes: Secondary | ICD-10-CM | POA: Diagnosis not present

## 2018-12-12 DIAGNOSIS — M797 Fibromyalgia: Secondary | ICD-10-CM | POA: Diagnosis not present

## 2018-12-12 MED ORDER — KETOROLAC TROMETHAMINE 10 MG PO TABS: 10.0000 mg | ORAL_TABLET | Freq: Three times a day (TID) | ORAL | 0 refills | Status: DC | PRN

## 2018-12-12 NOTE — Telephone Encounter (Signed)
No, I won;t refill the butalbital. In fact, new data shows it really should not be used at all in migraine management and so I am not refilling it for my patients. She can follow up with Amy or Aundra Millet if she needs to.Thanks.

## 2018-12-12 NOTE — Patient Instructions (Addendum)
Sounds like possible mildly enlarged lymph nodes or irritation of the hair follicles.  If the skin becomes red, tender, or you feel a pocket of fluid underneath the skin, come see me as this may be something that needs drained.  Can use warm compresses for 10 to 15 minutes at a time 3-4 times a day.  I went ahead and sent prescription for the Toradol, please do not take with other NSAIDs such as Motrin/ibuprofen, Aleve, Celebrex.

## 2018-12-12 NOTE — Progress Notes (Signed)
HPI: Holly [name] is a 43 y.o. female who  has a past medical history of Depression, Fibromyalgia (2018), GERD (gastroesophageal reflux disease), Hemiplegia (HCC), IBS (irritable bowel syndrome), Migraine, MRSA (methicillin resistant staph aureus) culture positive, and Vaginal Pap smear, abnormal.  she presents to [facility] today, 12/12/18,  for chief complaint of:  Skin problem   . Location: R groin/labia majora . Quality: subq bump keeps coming and going, starting to be painful and discolored, seems better today . Duration: 2 mos total . Timing: 3rd time it's happened  Also having more fibromyalgia pain, ibuprofen does not seem to be helping as much as it used to, would like to try Toradol.  Her dentist gave her some of this and it seemed to help.     At today's visit 12/12/18 ... PMH, PSH, FH reviewed and updated as needed.  Current medication list and allergy/intolerance hx reviewed and updated as needed. (See remainder of HPI, ROS, Phys Exam below)           ASSESSMENT/PLAN: The primary encounter diagnosis was Palpable lymph node. Diagnoses of Skin irritation and Fibromyalgia were also pertinent to this visit.    Meds ordered this encounter  Medications  . ketorolac (TORADOL) 10 MG tablet    Sig: Take 1 tablet (10 mg total) by mouth every 8 (eight) hours as needed for moderate pain or severe pain.    Dispense:  90 tablet    Refill:  0    Patient Instructions  Sounds like possible mildly enlarged lymph nodes or irritation of the hair follicles.  If the skin becomes red, tender, or you feel a pocket of fluid underneath the skin, come see me as this may be something that needs drained.  Can use warm compresses for 10 to 15 minutes at a time 3-4 times a day.  I went ahead and sent prescription for the Toradol, please do not take with other NSAIDs such as Motrin/ibuprofen, Aleve, Celebrex.          Follow-up plan:  Return if symptoms worsen or fail to improve.                                                 ################################################# ################################################# ################################################# #################################################    Current Meds  Medication Sig  . butalbital-acetaminophen-caffeine (FIORICET, ESGIC) 50-325-40 MG tablet TAKE 1 TABLET BY MOUTH EVERY 6 HOURS AS NEEDED FOR HEADACHE  . cyclobenzaprine (FLEXERIL) 5 MG tablet Take 1-2 tablets (5-10 mg total) by mouth 3 (three) times daily as needed for muscle spasms.  . Galcanezumab-gnlm (EMGALITY) 120 MG/ML SOAJ Inject 120 mg into the skin every 30 (thirty) days.  . montelukast (SINGULAIR) 10 MG tablet Take 1 tablet (10 mg) by mouth daily in the evening  . norethindrone (MICRONOR,CAMILA,ERRIN) 0.35 MG tablet Take 1 tablet (0.35 mg total) by mouth daily.  Marland Kitchen omeprazole (PRILOSEC) 40 MG capsule Take 1 capsule (40 mg total) by mouth daily.  . ondansetron (ZOFRAN) 8 MG tablet Take by mouth.  . ondansetron (ZOFRAN-ODT) 4 MG disintegrating tablet Take 1 tablet (4 mg total) by mouth every 8 (eight) hours as needed for nausea.  . prochlorperazine (COMPAZINE) 10 MG tablet Take 1 tablet (10 mg total) by mouth every 6 (six) hours as needed.  . QC LORATADINE-D 10-240 MG 24 hr tablet TAKE ONE TABLET  DAILY  . sertraline (ZOLOFT) 50 MG tablet TAKE ONE TABLET DAILY  . TROKENDI XR 200 MG CP24 Take 1 capsule by mouth at bedtime.  . [DISCONTINUED] ibuprofen (IBU) 800 MG tablet Take 1 tablet (800 mg total) by mouth every 8 (eight) hours as needed for mild pain or moderate pain.    Allergies  Allergen Reactions  . Other Rash    Overly sleepy  . Terbutaline Palpitations  . Sumatriptan   . Adhesive [Tape] Itching and Rash  . Aimovig [Erenumab-Aooe] Rash  . Celebrex [Celecoxib] Rash    03/17/18 patient is unsure, thinks it may have been  shaving cream  . Cetirizine Hcl Other (See Comments)    Overly sleepy   . Ciprofloxacin Rash       Review of Systems:  Constitutional: No recent illness  HEENT: No  headache, no vision change  Cardiac: No  chest pain, No  pressure, No palpitations  Respiratory:  No  shortness of breath. No  Cough  Gastrointestinal: No  abdominal pain  Musculoskeletal: No new myalgia/arthralgia  Skin: No  Rash- see HPI  Hem/Onc: No  easy bruising/bleeding  Neurologic: No  weakness, No  Dizziness  Psychiatric: No  concerns with depression, No  concerns with anxiety  Exam:  BP 112/67 (BP Location: Left Arm, Patient Position: Sitting, Cuff Size: Normal)   Pulse 94   Temp 98.2 F (36.8 C) (Oral)   Wt 136 lb 9.6 oz (62 kg)   BMI 21.39 kg/m   Constitutional: VS see above. General Appearance: alert, well-developed, well-nourished, NAD  Eyes: Normal lids and conjunctive, non-icteric sclera  Ears, Nose, Mouth, Throat: MMM, Normal external inspection ears/nares/mouth/lips/gums.  Neck: No masses, trachea midline.   Respiratory: Normal respiratory effort.   Musculoskeletal: Gait normal. Symmetric and independent movement of all extremities  Abdominal: non-tender, non-distended, no appreciable organomegaly, neg Murphy's, BS WNLx4  Neurological: Normal balance/coordination. No tremor.  Skin: warm, dry, intact.  Small normal palpable lymph node in the groin, I do not see any folliculitis or carbuncles/abscess.  Psychiatric: Normal judgment/insight. Normal mood and affect. Oriented x3.       Visit summary with medication list and pertinent instructions was printed for patient to review, patient was advised to alert us if any updates are needed. All questions at time of visit were answered - patient instructed to contact office with any additional concerns. ER/RTC precautions were reviewed with the patient and understanding verbalized.     Please note: voice recognition software was  used to produce this document, and typos may escape review. Please contact Dr. Lyn HollingsheadAlexander for any needed clarifications.    Follow up plan: Return if symptoms worsen or fail to improve.

## 2018-12-15 ENCOUNTER — Encounter: Payer: Self-pay | Admitting: Neurology

## 2018-12-15 NOTE — Telephone Encounter (Signed)
I have sent a mychart message to the patient advising that we will not be refilling the medication. I will check and see that the pt got that message. Offered the opportunity that if the pt needs to discuss alternative medications she can contact the office to schedule with NP.

## 2018-12-16 ENCOUNTER — Telehealth: Payer: Self-pay | Admitting: *Deleted

## 2018-12-16 ENCOUNTER — Telehealth: Payer: Self-pay

## 2018-12-16 NOTE — Telephone Encounter (Signed)
Emgality PA due again on Cover My Meds. KEY: [id number]. Awaiting determination from [facility].

## 2018-12-16 NOTE — Telephone Encounter (Signed)
As per pt, toradol rx will require PA. Pt states that rx has "black box warning". Thanks.

## 2018-12-17 ENCOUNTER — Encounter: Payer: Self-pay | Admitting: *Deleted

## 2018-12-17 NOTE — Telephone Encounter (Addendum)
[employer]ality approved by [employer] through 11/19/2019. Reference #: [id number].   Messaged pt through [employer].

## 2018-12-17 NOTE — Telephone Encounter (Signed)
Attempted to start PA and Toradol tablet does not meet medical necessity per insurance and no PA available. Please advise.

## 2018-12-18 NOTE — Telephone Encounter (Signed)
30 pills are $20 at [facility] through [facility] can see if she is ok with this price?

## 2018-12-19 MED ORDER — KETOROLAC TROMETHAMINE 10 MG PO TABS: 10.0000 mg | ORAL_TABLET | Freq: Three times a day (TID) | ORAL | 2 refills | Status: DC | PRN

## 2018-12-19 NOTE — Telephone Encounter (Signed)
Prescription sent as requested to [facility], patient will need to let the pharmacy know to use the good Rx coupon or she can print one herself from the website

## 2018-12-19 NOTE — Addendum Note (Signed)
Addended by: Deirdre Pippins on: 12/19/2018 03:17 PM   Modules accepted: Orders

## 2018-12-19 NOTE — Addendum Note (Signed)
Addended by: Arvilla Market on: 12/19/2018 02:29 PM   Modules accepted: Orders

## 2018-12-19 NOTE — Telephone Encounter (Signed)
Per previous note it means that appeal was not available.   I have called the patient to let her know and she wants a new prescription sent to [facility]. Can you send an updated one to the pharmacy?  Please advise.

## 2018-12-22 NOTE — Telephone Encounter (Signed)
Per previous conversation on 12/19/2018 patient was advised to use a goodrx card to get the RX since insurance would not pay for the Toradol. Patient voices understanding. She did not have any further questions. Patient was going to get her card and go to the pharmacy and pick it up. No further inquires during the call.

## 2019-01-06 ENCOUNTER — Telehealth: Payer: Self-pay

## 2019-01-06 NOTE — Telephone Encounter (Signed)
Pt called requesting med refill for ibuprofen 800 mg. As per pt, she was informed by the pharmacist that toradol rx can not be use as a daily because of the black box warning. She is fully aware that both meds can't be taken simultaneously & must be taken apart by 24 hrs. She wants to alternate between both meds. If appropriate, pls send to [facility].

## 2019-01-07 MED ORDER — IBUPROFEN 800 MG PO TABS: 800.0000 mg | ORAL_TABLET | Freq: Three times a day (TID) | ORAL | 2 refills | Status: DC | PRN

## 2019-01-07 NOTE — Telephone Encounter (Signed)
Pt has been updated. No other inquiries during call.  

## 2019-01-07 NOTE — Telephone Encounter (Signed)
Rx sent 

## 2019-01-20 ENCOUNTER — Other Ambulatory Visit: Payer: Self-pay | Admitting: *Deleted

## 2019-01-20 MED ORDER — GALCANEZUMAB-GNLM 120 MG/ML ~~LOC~~ SOAJ: 120.0000 mg | SUBCUTANEOUS | 11 refills | Status: DC

## 2019-01-28 ENCOUNTER — Ambulatory Visit (INDEPENDENT_AMBULATORY_CARE_PROVIDER_SITE_OTHER): Payer: Medicare Other | Admitting: Family Medicine

## 2019-01-28 ENCOUNTER — Other Ambulatory Visit: Payer: Self-pay

## 2019-01-28 ENCOUNTER — Encounter: Payer: Self-pay | Admitting: Family Medicine

## 2019-01-28 DIAGNOSIS — G43711 Chronic migraine without aura, intractable, with status migrainosus: Secondary | ICD-10-CM | POA: Diagnosis not present

## 2019-01-28 MED ORDER — UBROGEPANT 50 MG PO TABS: 50.0000 mg | ORAL_TABLET | Freq: Every day | ORAL | 11 refills | Status: DC | PRN

## 2019-01-28 NOTE — Progress Notes (Signed)
Personally  participated in, made any corrections needed, and agree with history, physical, neuro exam,assessment and plan as stated.     Antonia Ahern, MD [facility]     

## 2019-01-28 NOTE — Progress Notes (Signed)
Consent Form Botulism Toxin Injection For Chronic Migraine    Reviewed orally with patient, additionally signature is on file:  Botulism toxin has been approved by the Federal drug administration for treatment of chronic migraine. Botulism toxin does not cure chronic migraine and it may not be effective in some patients.  The administration of botulism toxin is accomplished by injecting a small amount of toxin into the muscles of the neck and head. Dosage must be titrated for each individual. Any benefits resulting from botulism toxin tend to wear off after 3 months with a repeat injection required if benefit is to be maintained. Injections are usually done every 3-4 months with maximum effect peak achieved by about 2 or 3 weeks. Botulism toxin is expensive and you should be sure of what costs you will incur resulting from the injection.  The side effects of botulism toxin use for chronic migraine may include:   -Transient, and usually mild, facial weakness with facial injections  -Transient, and usually mild, head or neck weakness with head/neck injections  -Reduction or loss of forehead facial animation due to forehead muscle weakness  -Eyelid drooping  -Dry eye  -Pain at the site of injection or bruising at the site of injection  -Double vision  -Potential unknown long term risks  Contraindications: You should not have Botox if you are pregnant, nursing, allergic to albumin, have an infection, skin condition, or muscle weakness at the site of the injection, or have myasthenia gravis, Lambert-Eaton syndrome, or ALS.  It is also possible that as with any injection, there may be an allergic reaction or no effect from the medication. Reduced effectiveness after repeated injections is sometimes seen and rarely infection at the injection site may occur. All care will be taken to prevent these side effects. If therapy is given over a long time, atrophy and wasting in the muscle injected may  occur. Occasionally the patient's become refractory to treatment because they develop antibodies to the toxin. In this event, therapy needs to be modified.  I have read the above information and consent to the administration of botulism toxin.    BOTOX PROCEDURE NOTE FOR MIGRAINE HEADACHE    Contraindications and precautions discussed with patient(above). Aseptic procedure was observed and patient tolerated procedure. Procedure performed by Dr. Artemio Aly  The condition has existed for more than 6 months, and pt does not have a diagnosis of ALS, Myasthenia Gravis or Lambert-Eaton Syndrome.  Risks and benefits of injections discussed and pt agrees to proceed with the procedure.  Written consent obtained  These injections are medically necessary. Pt  receives good benefits from these injections. These injections do not cause sedations or hallucinations which the oral therapies may cause.  Description of procedure:  The patient was placed in a sitting position. The standard protocol was used for Botox as follows, with 5 units of Botox injected at each site:   -Procerus muscle, midline injection  -Corrugator muscle, bilateral injection  -Frontalis muscle, bilateral injection, with 2 sites each side, medial injection was performed in the upper one third of the frontalis muscle, in the region vertical from the medial inferior edge of the superior orbital rim. The lateral injection was again in the upper one third of the forehead vertically above the lateral limbus of the cornea, 1.5 cm lateral to the medial injection site.  - Levator Scapulae: 5 units bilaterally  -Temporalis muscle injection, 5 sites, bilaterally. The first injection was 3 cm above the tragus of the ear,  second injection site was 1.5 cm to 3 cm up from the first injection site in line with the tragus of the ear. The third injection site was 1.5-3 cm forward between the first 2 injection sites. The fourth injection site was  1.5 cm posterior to the second injection site. 5th site laterally in the temporalis  muscleat the level of the outer canthus.  -Occipitalis muscle injection, 3 sites, bilaterally. The first injection was done one half way between the occipital protuberance and the tip of the mastoid process behind the ear. The second injection site was done lateral and superior to the first, 1 fingerbreadth from the first injection. The third injection site was 1 fingerbreadth superiorly and medially from the first injection site.  -Cervical paraspinal muscle injection, 2 sites, bilateral, first injection site was 1 cm from the midline of the cervical spine, 3 cm inferior to the lower border of the occipital protuberance. The second injection site was 1.5 cm superiorly and laterally to the first injection site.  -Trapezius muscle injection was performed at 3 sites, bilaterally. The first injection site was in the upper trapezius muscle halfway between the inflection point of the neck, and the acromion. The second injection site was one half way between the acromion and the first injection site. The third injection was done between the first injection site and the inflection point of the neck.   Will return for repeat injection in 3 months.   A 155 units of Botox was used, any Botox not injected was wasted. The patient tolerated the procedure well, there were no complications of the above procedure.

## 2019-01-28 NOTE — Progress Notes (Signed)
Botox- 100 units x 2 vials Lot: [id number] Expiration: 06/2021 NDC: 0023-1145-01  Bacteriostatic 0.9% Sodium Chloride- 4mL total Lot: [id number] Expiration: 08/20/2019 NDC: 0409-1966-02  Dx: G43.711 B/B   

## 2019-01-29 ENCOUNTER — Telehealth: Payer: Self-pay

## 2019-01-29 NOTE — Telephone Encounter (Signed)
Pending approval for Ubrelvy 50 mg tablets Key: [id number] Rx #: [id number] ICD 10 code: G43.711  [facility] is reviewing your PA request. Typically an electronic response will be received within 72 hours. To check for an update later, open this request from your dashboard

## 2019-01-30 ENCOUNTER — Ambulatory Visit: Payer: Medicare Other | Admitting: Neurology

## 2019-02-03 NOTE — Telephone Encounter (Signed)
Unable to get in contact with the patient. I left a voicemail letting her know that the copay card that she received for the Holly [name] can be used to override a PA being needed. Office number provided in case she has any questions or concerns.

## 2019-02-03 NOTE — Telephone Encounter (Signed)
Unable to get in contact with the patient. So I left her voicemail letter her know that we have sent a appeal on her behalf to see if we can get her Bernita Raisin approved. Office number provided in case she has any questions or concerns.

## 2019-02-03 NOTE — Telephone Encounter (Signed)
Pt has called [facility] [place] back to inform that her insurance causes copay card to be voided out.  Please call pt back

## 2019-02-09 ENCOUNTER — Telehealth: Payer: Self-pay

## 2019-02-09 NOTE — Telephone Encounter (Signed)
Spoke with the patient to let her know that we did a appeal for her Bernita Raisin. It has since been approved through 11-19-2019. Patient was very appreciative for the phone call. A copy has been fax to her pharmacy. Confirmation fax has been received.

## 2019-02-10 ENCOUNTER — Other Ambulatory Visit: Payer: Self-pay | Admitting: Osteopathic Medicine

## 2019-02-10 DIAGNOSIS — G43409 Hemiplegic migraine, not intractable, without status migrainosus: Secondary | ICD-10-CM

## 2019-02-13 ENCOUNTER — Encounter: Payer: Self-pay | Admitting: *Deleted

## 2019-02-13 ENCOUNTER — Other Ambulatory Visit: Payer: Self-pay

## 2019-02-13 ENCOUNTER — Ambulatory Visit (INDEPENDENT_AMBULATORY_CARE_PROVIDER_SITE_OTHER): Payer: Medicare Other | Admitting: Neurology

## 2019-02-13 ENCOUNTER — Telehealth: Payer: Self-pay | Admitting: *Deleted

## 2019-02-13 DIAGNOSIS — G43711 Chronic migraine without aura, intractable, with status migrainosus: Secondary | ICD-10-CM

## 2019-02-13 DIAGNOSIS — G43411 Hemiplegic migraine, intractable, with status migrainosus: Secondary | ICD-10-CM | POA: Diagnosis not present

## 2019-02-13 MED ORDER — TOPIRAMATE ER 100 MG PO CAP24: 100.0000 mg | ORAL_CAPSULE | Freq: Every day | ORAL | 6 refills | Status: DC

## 2019-02-13 MED ORDER — BUTALBITAL-APAP-CAFFEINE 50-325-40 MG PO TABS: ORAL_TABLET | ORAL | 4 refills | Status: DC

## 2019-02-13 NOTE — Progress Notes (Signed)
PATIENT: Holly [name] DOB: 1976-06-18  REASON FOR VISIT: follow up HISTORY FROM: patient  HISTORY OF PRESENT ILLNESS:  Interval history 02/13/2019: This is a patient with chronic intractable hemiplegic migraines for many years. She has tried a plethora of medications including the new CGRP medications and now botox for migraines. She continues to suffer immensely with her migraines. The Bernita Raisin is wonderful but it knocks her out. She injected her emgality yesterday, her chest tightened up and she became dizzy. This has never happened. But she was having a migraine at the time of taking the medication. Unclear why this happened, but she says it was not serious or urgent. She had botox on 01/28/2019.  She refilled fioricet 3x in a 10-day period, she lost one prescription and one fell in the toilet and she was very upset, explained we absolutely trust her and we apologize. The last botox she had the worst migraine, she would like to have me do her injections. She feels the migraines becoming more manageable 50% better as manageable. She feels her menses are worse on the Trokendi, she has a lot of pain. And a lot of mood swings and being irate. Decrease Trokendi to   She had a great response to imitrex in the past but then had side effects which were likely due to the vasococonstrictive effects so Lasmitidan may be a great medication.    Tpday 09/22/2018: Difficulty with insomnia, melatonin is not working, she has been on everything and has neck pain, memory foam with ergonomic pillow. Her neck pain is  like someone hitting her in the back of the neck. She feels her mind is racing and keeps her awake but often it is the pain in the neck that stops her from sleeping. Discussed options, will Add botox on to the regimen. She has a lot pain and shooting/burning at the back of the neck possibly occipital neuralgia. She continues to have daily headaches. 15 migraine days a month of which 4 are severe  hemiplegic tyoe. No aura. No medication overuse. Can last 24-48 hours and are moderately severe to severe ongoing for years.   Today 02/13/19  Holly [name] is a 43 year old female with a history of migraine headaches.  She returns today for follow-up.  She remains on Trokendi and Fioricet.  She states that she has had 3 injections of Emgality.  She reports that her headache severity has definitely improved.  She continues to have a daily headache however since starting the injections her headaches are not lasting all day long.  She also reports that she is not having to go to urgent care as frequently as before.  She states that she has one severe migraine with hemiplegic symptoms at least once a week.  She states that she did have the LEAP procedure due to an abnormal Pap smear and she has significant bleeding afterwards and was placed on a progesterone tablet.  She reports for this reason her headaches were slightly worse during this time.  She has stopped the progesterone.  She also reports that with the injection she has noticed constipation within the first 10 days after the injection.  She states that she has tried Colace with no benefit.  She did try MiraLAX and that seemed to work better.  She returns today for an evaluation.  HISTORY  09/16/2017: She has been diagnosed with fibromyalgia. She is allergic to Celebrex. She is seeing her pcp and they may start Lyrica, which is also a  migraine medication. She saw another doctor, who is a good neurologist and I recommend following with them. She uses Fioricet which I don't prefer but it is the only think that helps her, she has been advised about rebound headache. She has also seen Dr. Catalina Lunger at the [facility].   Interval History 08/07/2016:Holly A Garrettis a 43 y.o. femalehere as a referral from Dr. Alba Destine hemiplegic migraines. She has severe migraines what impair her both physicilly and cognitively, she can't even  remember her own name, she can;t walk straight, her balance is impaired, she has slurred speech, she stutters, can;t take care of her 6 children or at times herself when recurrence of migraines. 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 The last several have been extreme in her severity. She has gone to the emergency room multiple times for severe refractory migraines. Patient's migraines have improved with Topiramate but they are still occurring 3-4 daysa week and can be severe when she has a migraine 10 days in a row. She takes Topiramate  daily. She is breastfeeding. When she has migraines she is debilitated often and has to stay in bed in a dark room all day and her husband has had to stay home. She has 15-17 migraine days in a month. She is weaning her last child this last month and her hormones are fluctuating. Will have the Botox representative call.   Meds tried: Topiramate,Amitrip, nortrip. Currently on topamax  daily. Also tried venlafaxine, imitrex, maxalt, treximet. Blood pressure medications are contraindicated due to her very low blood pressure.   Holly [name] 05/29/2016: Today 04/29/2016: Holly [name] is a 43 year old female with a history of migraine headaches. She returns today for follow-up. She states that her headaches have improved with Topamax. She states she typically has 1-2 headaches a week. Before she was having 3-5 headaches a week. She is currently taken Topamax 150 mg at bedtime. She reports that with her migraines she is experiencing weakness on the right side typically in the arm but it can move to the leg. She also noticed slurring speech and stuttering. In the past the symptoms have been worrisome and she went to the emergency room thinking she was having a stroke. She was told that it was migraine headache. She reports that the symptoms never occur without a  migraine. She uses caffeine, Tylenol, Advil and Fioricet in that order to treat her acute headaches. In the past she has tried triptans and Cambia with minimal benefit. She states baclofen was helpful for her headaches however it stopped her milk production. She plans to continue breast-feeding to the child is 83 months old. She returns today for an evaluation.    HPI 04/06/2016:Lam A Garrettis a 43 y.o.femalehere as a referral from Dr. Duanne Guess. She has had migraines for 21 years. She has tried a lot of things. Migraines are affecting her speech and she is slurring stuttering with the migraines. She trained in nursing and psychology. She has daily headaches, migraines. They are on the left side 98% of the time, behind the eye, throbbing and pounding like a "pick axe", laying down in a dark room helps, endorses extreme sounds sensitivity, light sensitivity, smell triggers, no aura. 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 No medication overuse. She had extensive testing at the [facility] with MRIs and LPs. She has nausea and vomiting with the migraines. No  inciting event, no head trauma, has a FHx of migraines. Lack of sleep worsens, drinks caffeine once a day. She drink enough fluid. She was bedridden for a whole year with migraines. She has daily migraines that can last all day.No other focal neurologic deficits. She does have musculoskeletal neck pain.  She has tried everything: Only Fioricet and Vicodin helps.That is the only thing she takes. She tried Amitrip, nortrip. Currently on topamax 75mg  daily. Also tried venlafaxine, imitrex, maxalt, treximet.   Never tried propranolol  Reviewed notes, labs and imaging from outside physicians, which showed:  CT if the head 2004: CLINICAL DATA: HEADACHE. RIGHT AND LEFT-SIDED NUMBNESS OF ARMS AND LEGS. BLURRED VISION FOR TWO WEEKS. TWO MONTHS POST PARTUM. PREVIOUS MRI OF THE BRAIN IN 1998.CT SCAN OF THE HEAD WITHOUT CONTRAST  WITH BONE WINDOWS A SERIES OF SCANS OF THE ENTIRE HEAD ARE MADE WITHOUT CONTRAST AND SHOW NO EVIDENCE OF INTRACRANIAL MASS OR HEMORRHAGE. THERE IS NO SHIFT OF MIDLINE STRUCTURES. THE VENTRICULAR SYSTEM APPEARS NORMAL. BONE WINDOWS SHOW THE PARANASAL SINUSES, BASE OF THE SKULL, BONY CALVARIUM, AND INTERNAL AUDITORY CANALS TO BE NORMAL. IMPRESSION NORMAL CT SCAN OF THE HEAD WITHOUT CONTRAST.  RPR, HIV negative.   Reviewed notes from OBGyn: Pt has had migraines for 20 years. Patient reports continuous migraine for 3 months. She is on Topamax 75mg  qhs. She has tunnel vision with migraines. Cries unconsolably with blood in her tears. She has diarrhea with the Topamax but wants to continue it. She is nursing her baby and is almost done, decreased sleep and fatigue is worsening migraines. 5 trips to the ER in the last 6 weeks. Imitrex gave her a panic attack and numbness in the face. Exam was unremarkable. On Zofran and Topiramate.    REVIEW OF SYSTEMS: Out of a complete 14 system review of symptoms, the patient complains only of the following symptoms, and all other reviewed systems are negative.  Fatigue, ringing in ears, constipation, insomnia, frequent waking her back pain, bruise/bleed easily  ALLERGIES: Allergies  Allergen Reactions   Other Rash    Overly sleepy   Terbutaline Palpitations   Sumatriptan    Adhesive [Tape] Itching and Rash   Aimovig [Erenumab-Aooe] Rash   Celebrex [Celecoxib] Rash    03/17/18 patient is unsure, thinks it may have been shaving cream   Cetirizine Hcl Other (See Comments)    Overly sleepy    Ciprofloxacin Rash    HOME MEDICATIONS: Outpatient Medications Prior to Visit  Medication Sig Dispense Refill   Galcanezumab-gnlm (EMGALITY) 120 MG/ML SOAJ Inject 120 mg into the skin every 30 (thirty) days. 1 pen 11   ibuprofen (ADVIL,MOTRIN) 800 MG tablet Take 1 tablet (800 mg total) by mouth every 8 (eight) hours as needed. 60 tablet 2   ketorolac  (TORADOL) 10 MG tablet Take 1 tablet (10 mg total) by mouth every 8 (eight) hours as needed for moderate pain or severe pain. 30 tablet 2   montelukast (SINGULAIR) 10 MG tablet Take 1 tablet (10 mg) by mouth daily in the evening (Patient not taking: Reported on 02/13/2019) 90 tablet 3   norethindrone (MICRONOR,CAMILA,ERRIN) 0.35 MG tablet TAKE ONE TABLET BY MOUTH EVERY DAY 28 tablet 0   omeprazole (PRILOSEC) 40 MG capsule Take 1 capsule (40 mg total) by mouth daily. 90 capsule 3   ondansetron (ZOFRAN-ODT) 4 MG disintegrating tablet Take 1 tablet (4 mg total) by mouth every 8 (eight) hours as needed for nausea. 30 tablet 11   prochlorperazine (COMPAZINE) 10  MG tablet Take 1 tablet (10 mg total) by mouth every 6 (six) hours as needed. 30 tablet 11   QC LORATADINE-D 10-240 MG 24 hr tablet TAKE ONE TABLET DAILY 30 tablet 11   sertraline (ZOLOFT) 50 MG tablet TAKE ONE TABLET DAILY 90 tablet 0   Ubrogepant (UBRELVY) 50 MG TABS Take 50 mg by mouth daily as needed. Take one tablet at onset of headache, may repeat 1 tablet in 2 hours, no more than 2 tablets in 24 hours 10 tablet 11   butalbital-acetaminophen-caffeine (FIORICET, ESGIC) 50-325-40 MG tablet TAKE 1 TABLET BY MOUTH EVERY 6 HOURS AS NEEDED FOR HEADACHE 15 tablet 3   ondansetron (ZOFRAN) 8 MG tablet Take by mouth.     TROKENDI XR 200 MG CP24 Take 1 capsule by mouth at bedtime. 30 capsule 11   No facility-administered medications prior to visit.     PAST MEDICAL HISTORY: Past Medical History:  Diagnosis Date   Depression    Fibromyalgia 2018   GERD (gastroesophageal reflux disease)    Hemiplegia (HCC)    IBS (irritable bowel syndrome)    Migraine    MRSA (methicillin resistant staph aureus) culture positive    Vaginal Pap smear, abnormal     PAST SURGICAL HISTORY: Past Surgical History:  Procedure Laterality Date   WRIST SURGERY     cyst removal    FAMILY HISTORY: Family History  Problem Relation Age of Onset     Thyroid disease Mother    Scoliosis Mother    Depression Mother    Migraines Mother    Hyperlipidemia Mother    Hypertension Mother    Heart attack Mother    Crohn's disease Father    Autoimmune disease Father    Factor V Leiden deficiency Father    Heart failure Father    Diabetes Father    Hyperlipidemia Father    Heart attack Father    Cancer Sister    Depression Sister    Diabetes Sister    Diabetes Paternal Grandfather     SOCIAL HISTORY: Social History   Socioeconomic History   Marital status: Married    Spouse name: Not on file   Number of children: 6   Years of education: Not on file   Highest education level: Not on file  Occupational History   Occupation: Arts development officerHome Maker  Social Needs   Financial resource strain: Not on file   Food insecurity:    Worry: Not on file    Inability: Not on file   Transportation needs:    Medical: Not on file    Non-medical: Not on file  Tobacco Use   Smoking status: Never Smoker   Smokeless tobacco: Never Used  Substance and Sexual Activity   Alcohol use: No    Alcohol/week: 0.0 standard drinks    Comment: none   Drug use: No   Sexual activity: Yes    Birth control/protection: Pill  Lifestyle   Physical activity:    Days per week: Not on file    Minutes per session: Not on file   Stress: Not on file  Relationships   Social connections:    Talks on phone: Not on file    Gets together: Not on file    Attends religious service: Not on file    Active member of club or organization: Not on file    Attends meetings of clubs or organizations: Not on file    Relationship status: Not on file   Intimate partner  violence:    Fear of current or ex partner: Not on file    Emotionally abused: Not on file    Physically abused: Not on file    Forced sexual activity: Not on file  Other Topics Concern   Not on file  Social History Narrative   1 caffeine drink a day at max   Right handed    Lives at home with children. Husband is a Naval architect and rarely home.      PHYSICAL EXAM  There were no vitals filed for this visit. There is no height or weight on file to calculate BMI.  Generalized: Well developed, in no acute distress   Neurological examination  Mentation: Alert oriented to time, place, history taking. Follows all commands speech and language fluent Cranial nerve II-XII: Pupils were equal round reactive to light. Extraocular movements were full, visual field were full on confrontational test. Facial sensation and strength were normal. Uvula tongue midline. Head turning and shoulder shrug  were normal and symmetric. Motor: The motor testing reveals 5 over 5 strength of all 4 extremities. Good symmetric motor tone is noted throughout.  Sensory: Sensory testing is intact to soft touch on all 4 extremities. No evidence of extinction is noted.  Coordination: Cerebellar testing reveals good finger-nose-finger and heel-to-shin bilaterally.  Gait and station: Gait is normal. Tandem gait is normal. Romberg is negative. No drift is seen.  Reflexes: Deep tendon reflexes are symmetric and normal bilaterally.   DIAGNOSTIC DATA (LABS, IMAGING, TESTING) - I reviewed patient records, labs, notes, testing and imaging myself where available.  Lab Results  Component Value Date   WBC 5.2 03/05/2018   HGB 12.2 03/05/2018   HCT 35.0 03/05/2018   MCV 89.5 03/05/2018   PLT 237 03/05/2018      Component Value Date/Time   NA 140 01/21/2018 0935   K 4.1 01/21/2018 0935   CL 111 (H) 01/21/2018 0935   CO2 24 01/21/2018 0935   GLUCOSE 94 01/21/2018 0935   BUN 9 01/21/2018 0935   CREATININE 1.02 01/21/2018 0935   CALCIUM 8.9 01/21/2018 0935   PROT 6.7 01/21/2018 0935   ALBUMIN 4.2 02/12/2017 1510   AST 11 01/21/2018 0935   ALT 6 01/21/2018 0935   ALKPHOS 56 02/12/2017 1510   BILITOT 0.5 01/21/2018 0935   GFRNONAA 69 01/21/2018 0935   GFRAA 80 01/21/2018 0935   Lab Results    Component Value Date   CHOL 109 01/21/2018   HDL 43 (L) 01/21/2018   LDLCALC 53 01/21/2018   TRIG 51 01/21/2018   CHOLHDL 2.5 01/21/2018    Lab Results  Component Value Date   TSH 1.40 01/21/2018      ASSESSMENT AND PLAN 42 y.o. year old female  has a past medical history of Depression, Fibromyalgia (2018), GERD (gastroesophageal reflux disease), Hemiplegia (HCC), IBS (irritable bowel syndrome), Migraine, MRSA (methicillin resistant staph aureus) culture positive, and Vaginal Pap smear, abnormal. here with:  - Migraine prevention: Continue Emgality, will decrease Trokendi to  (side effects), continue Botox  - Acute management: Consider ReyVow, she will email me. In the past had great response to imitrex but side effects c/w vasoconstriction properties of triptan so Lasmiditan may be a great alternative as it does not have those same vasoconstrictive properties so safer for patients with hemiplegic migraines and those with significant risk due to vasoconstriction.  - If patient needs vicodin she could have a few a month, this is an extenuating case for migraines and  explained I wouldn't do that for many migraine patients but with hemiplegia if this helps and nothing else helps we could consider this.  - Also Fioricet, will give her 15 a month but explained do not take more than 5x - 10x a month (she takes 2 pills at onset). This is also not something I like to do for the average migraine patient but given her severity of hemiplegia and her trustworthiness will continue.  - Neck pain: Occipital nerve irritation/neuralgia. Will refer to Dr. Alvester Morin for occipital procedures to help with neck pain.  A total of 25 minutes was spent with this patient. Over half this time was spent on counseling patient on the  1. Chronic migraine without aura, with intractable migraine, so stated, with status migrainosus   2. Intractable hemiplegic migraine with status migrainosus     diagnosis and  different diagnostic and therapeutic options, counseling and coordination of care, risks ans benefits of management, compliance, or risk factor reduction and education.      Naomie Dean, MD [facility] [address] [place], [place] [address] [phone]

## 2019-02-13 NOTE — Telephone Encounter (Signed)
Spoke with patient this morning and offered sooner appt via telephone with Dr. Lucia Gaskins. Pt understands that this will be treated like an in-office visit that will file with insurance. There may be a patient responsible charge for this service. She consent to telephone visit and we scheduled this visit for 0945 AM today with Dr. Lucia Gaskins. Her medications, allergies, med/sx/fam hx, etc have all been updated.

## 2019-02-23 ENCOUNTER — Telehealth: Payer: Self-pay | Admitting: Neurology

## 2019-02-23 ENCOUNTER — Other Ambulatory Visit: Payer: Self-pay | Admitting: *Deleted

## 2019-02-23 MED ORDER — KETOROLAC TROMETHAMINE 60 MG/2ML IM SOLN: INTRAMUSCULAR | 0 refills | Status: DC

## 2019-02-23 MED ORDER — METHYLPREDNISOLONE 4 MG PO TBPK: ORAL_TABLET | ORAL | 0 refills | Status: DC

## 2019-02-23 NOTE — Progress Notes (Signed)
Subjective:   Holly [name] is a 43 y.o. female who presents for an Initial Medicare Annual Wellness Visit.  Review of Systems    No ROS.  Medicare Wellness Visit. Additional risk factors are reflected in the social history.   Cardiac Risk Factors include: none Sleep patterns: Getting 4-5 hours of sleep a night. Does not wake up to go to the bathroom. Wakes up feeling exhausted.  Home Safety/Smoke Alarms: Feels safe in home. Smoke alarms in place.  Living environment; Lives with husband and children in a 1 story home no stairs in the house. Shower is a tub and grab bars are not in place. Seat Belt Safety/Bike Helmet: Wears seat belt.   Female:   Pap-  utd     Mammo- due      Dexa scan- not eligible due to age        CCS- not eligible due to age     Objective:    Today's Vitals   02/24/19 1113  Pulse: 72  SpO2: 95%  Weight: 131 lb (59.4 kg)  PainSc: 7    Body mass index is 20.52 kg/m.  Advanced Directives 02/24/2019 11/27/2016 07/07/2015 07/04/2015 07/04/2015 06/26/2015 06/13/2015  Does Patient Have a Medical Advance Directive? No No No No No No No  Would patient like information on creating a medical advance directive? Yes (MAU/Ambulatory/Procedural Areas - Information given) - No - patient declined information No - patient declined information No - patient declined information - No - patient declined information    Current Medications (verified) Outpatient Encounter Medications as of 02/24/2019  Medication Sig  . butalbital-acetaminophen-caffeine (FIORICET, ESGIC) 50-325-40 MG tablet Take 1-2 tablets at onset of migraine every 6 hours as needed.  . Galcanezumab-gnlm (EMGALITY) 120 MG/ML SOAJ Inject 120 mg into the skin every 30 (thirty) days.  Marland Kitchen. ibuprofen (ADVIL,MOTRIN) 800 MG tablet Take 1 tablet (800 mg total) by mouth every 8 (eight) hours as needed.  Marland Kitchen. ketorolac (TORADOL) 60 MG/2ML SOLN injection Inject 60 mg (2 mL) one daily as needed for migraine. Do not exceed 4 doses  (days) per month. Do not take with oral Toradol.  . methylPREDNISolone (MEDROL DOSEPAK) 4 MG TBPK tablet Take pills daily together in the morning with food for 6 days.  . montelukast (SINGULAIR) 10 MG tablet Take 1 tablet (10 mg) by mouth daily in the evening  . norethindrone (MICRONOR,CAMILA,ERRIN) 0.35 MG tablet TAKE ONE TABLET BY MOUTH EVERY DAY  . omeprazole (PRILOSEC) 40 MG capsule Take 1 capsule (40 mg total) by mouth daily.  . ondansetron (ZOFRAN-ODT) 4 MG disintegrating tablet Take 1 tablet (4 mg total) by mouth every 8 (eight) hours as needed for nausea.  . prochlorperazine (COMPAZINE) 10 MG tablet Take 1 tablet (10 mg total) by mouth every 6 (six) hours as needed.  . QC LORATADINE-D 10-240 MG 24 hr tablet TAKE ONE TABLET DAILY  . sertraline (ZOLOFT) 50 MG tablet TAKE ONE TABLET DAILY  . Topiramate ER (TROKENDI XR) 100 MG CP24 Take 100 mg by mouth at bedtime.  Marland Kitchen. ketorolac (TORADOL) 10 MG tablet Take 1 tablet (10 mg total) by mouth every 8 (eight) hours as needed for moderate pain or severe pain. (Patient not taking: Reported on 02/24/2019)  . Ubrogepant (UBRELVY) 50 MG TABS Take 50 mg by mouth daily as needed. Take one tablet at onset of headache, may repeat 1 tablet in 2 hours, no more than 2 tablets in 24 hours (Patient not taking: Reported on 02/24/2019)  No facility-administered encounter medications on file as of 02/24/2019.     Allergies (verified) Other; Terbutaline; Sumatriptan; Adhesive [tape]; Aimovig [erenumab-aooe]; Celebrex [celecoxib]; Cetirizine hcl; and Ciprofloxacin   History: Past Medical History:  Diagnosis Date  . Depression   . Fibromyalgia 2018  . GERD (gastroesophageal reflux disease)   . Hemiplegia (HCC)   . IBS (irritable bowel syndrome)   . Migraine   . MRSA (methicillin resistant staph aureus) culture positive   . Vaginal Pap smear, abnormal    Past Surgical History:  Procedure Laterality Date  . WRIST SURGERY     cyst removal   Family History   Problem Relation Age of Onset  . Thyroid disease Mother   . Scoliosis Mother   . Depression Mother   . Migraines Mother   . Hyperlipidemia Mother   . Hypertension Mother   . Heart attack Mother   . Crohn's disease Father   . Autoimmune disease Father   . Factor V Leiden deficiency Father   . Heart failure Father   . Diabetes Father   . Hyperlipidemia Father   . Heart attack Father   . Cancer Sister   . Depression Sister   . Diabetes Sister   . Diabetes Paternal Grandfather    Social History   Socioeconomic History  . Marital status: Married    Spouse name: Vincenza Hews  . Number of children: 6  . Years of education: 88  . Highest education level: Some college, no degree  Occupational History  . Occupation: Arts development officer  Social Needs  . Financial resource strain: Not hard at all  . Food insecurity:    Worry: Never true    Inability: Never true  . Transportation needs:    Medical: No    Non-medical: No  Tobacco Use  . Smoking status: Never Smoker  . Smokeless tobacco: Never Used  Substance and Sexual Activity  . Alcohol use: No    Alcohol/week: 0.0 standard drinks    Comment: none  . Drug use: No  . Sexual activity: Yes    Birth control/protection: Pill  Lifestyle  . Physical activity:    Days per week: 0 days    Minutes per session: 0 min  . Stress: Not at all  Relationships  . Social connections:    Talks on phone: Never    Gets together: Never    Attends religious service: Never    Active member of club or organization: No    Attends meetings of clubs or organizations: Never    Relationship status: Married  Other Topics Concern  . Not on file  Social History Narrative   1 caffeine drink a day at max   Right handed   Lives at home with children. Husband is a Naval architect and rarely home.   Has a lot of migraines    Tobacco Counseling Counseling given: Not Answered   Clinical Intake:  Pre-visit preparation completed: Yes  Pain : 0-10 Pain Score:  7  Pain Type: Chronic pain Pain Location: Head Pain Orientation: Left Pain Descriptors / Indicators: Headache, Stabbing, Throbbing, Pressure Pain Onset: In the past 7 days Pain Frequency: Intermittent Effect of Pain on Daily Activities: effects daily activities when has migraines     Nutritional Risks: None Diabetes: No  How often do you need to have someone help you when you read instructions, pamphlets, or other written materials from your doctor or pharmacy?: 1 - Never What is the last grade level you completed in school?:  15  Interpreter Needed?: No  Information entered by :: Carolin Sicks, LPN   Activities of Daily Living In your present state of health, do you have any difficulty performing the following activities: 02/24/2019  Hearing? N  Vision? N  Comment time for eye exam. Will schedule  Difficulty concentrating or making decisions? N  Walking or climbing stairs? N  Dressing or bathing? N  Doing errands, shopping? N  Preparing Food and eating ? N  Using the Toilet? N  In the past six months, have you accidently leaked urine? N  Do you have problems with loss of bowel control? N  Managing your Medications? N  Managing your Finances? N  Housekeeping or managing your Housekeeping? N  Some recent data might be hidden     Immunizations and Health Maintenance Immunization History  Administered Date(s) Administered  . Influenza-Unspecified 08/19/2016, 09/02/2016  . Tdap 08/14/2009, 05/02/2015   There are no preventive care reminders to display for this patient.  Patient Care Team: Sunnie Nielsen, DO as PCP - General (Osteopathic Medicine)  Indicate any recent Medical Services you may have received from other than [facility] providers in the past year (date may be approximate).     Assessment:   This is a routine wellness examination for Delilah..nocope   Hearing/Vision screen Hearing Screening Comments: Hearing and vision not done due to telephone visit from  home due to COVID19 crisis Vision Screening Comments: Not done due to COVID19 crisis. Telephone visit Diet Eats a somewhat healthy diet. Tries to get a protein in daily. Breakfast: dry cereal or bagel Lunch: dry cereal or graham crackers Dinner:  Meat and vegetables  Drinks at least 18 cups of water daily.   Dietary issues and exercise activities discussed: Current Exercise Habits: The patient does not participate in regular exercise at present, Exercise limited by: neurologic condition(s)(migraine headaches)  Goals   None    Depression Screen PHQ 2/9 Scores 02/24/2019 12/12/2018 08/06/2018 07/17/2018 07/05/2017 04/01/2017  PHQ - 2 Score 0 2 2 2 1 2   PHQ- 9 Score - 13 7 12  - 10    Fall Risk Fall Risk  02/24/2019  Falls in the past year? 0  Follow up Falls prevention discussed    Is the patient's home free of loose throw rugs in walkways, pet beds, electrical cords, etc?   yes      Grab bars in the bathroom? no      Handrails on the stairs?   no      Adequate lighting?   yes   Cognitive Function:     6CIT Screen 02/24/2019  What Year? 0 points  What month? 0 points  What time? 0 points  Count back from 20 0 points  Months in reverse 0 points  Repeat phrase 0 points  Total Score 0    Screening Tests Health Maintenance  Topic Date Due  . INFLUENZA VACCINE  06/20/2019  . PAP SMEAR-Modifier  01/07/2021  . TETANUS/TDAP  05/01/2025  . HIV Screening  Completed       Plan:      Ms. Buzzetta , Thank you for taking time to come for your Medicare Wellness Visit. I appreciate your ongoing commitment to your health goals. Please review the following plan we discussed and let me know if I can assist you in the future. Please schedule your next medicare wellness visit with me in 1 yr. Continue doing brain stimulating activities (puzzles, reading, adult coloring books, staying active) to keep memory sharp.  These are the goals we discussed: Goals   None     This is a list  of the screening recommended for you and due dates:  Health Maintenance  Topic Date Due  . Flu Shot  06/20/2019  . Pap Smear  01/07/2021  . Tetanus Vaccine  05/01/2025  . HIV Screening  Completed     I have personally reviewed and noted the following in the patient's chart:   . Medical and social history . Use of alcohol, tobacco or illicit drugs  . Current medications and supplements . Functional ability and status . Nutritional status . Physical activity . Advanced directives . List of other physicians . Hospitalizations, surgeries, and ER visits in previous 12 months . Vitals . Screenings to include cognitive, depression, and falls . Referrals and appointments  In addition, I have reviewed and discussed with patient certain preventive protocols, quality metrics, and best practice recommendations. A written personalized care plan for preventive services as well as general preventive health recommendations were provided to patient.     Normand Sloop, LPN   11/24/1094

## 2019-02-23 NOTE — Telephone Encounter (Signed)
I spoke with the patient. She has used steroid dose packs in the past and they usually work for her. She is also willing to do Toradol injections at home. She is aware that she will need to draw up the medication herself and inject into the muscle. She is comfortable doing this. She has a sharps box and gloves. She was advised on proper hygiene and to cleanse her skin first. She is also aware that the pharmacy can show her how to draw up the medication. She verbalized appreciation and I advised that I would speak with Dr. Lucia Gaskins and we would order the medications. She also stated today she still feels a little nauseous but her migraine is better, not as bad as Friday.

## 2019-02-23 NOTE — Telephone Encounter (Signed)
Will you call patient and see if she would be willing to try Toradol shots at home? Also ask if a medrol dosepak has helped in the past. See my her email, she is having a migraine. Thank you.

## 2019-02-23 NOTE — Telephone Encounter (Signed)
Spoke with pt about the Toradol. She stated she has not been taking the oral Toradol because it didn't work. She didn't even refill the medication. She stated she is aware of the black box warning for Toradol and she is very careful of her medications. Aware Toradol should not be used more than 4-5 days. Stated she would not be taking the Toradol in the same day as the Ibuprofen and is mindful that overuse of the antiflammatory medications can cause bleeding problems and kidney damage. She verbalized appreciation for the call. She also asked for a message to be sent to Dr. Lucia Gaskins saying she is apprehensive but willing to try the new medication "starts with R" they had talked about last week.

## 2019-02-23 NOTE — Telephone Encounter (Signed)
Called pt & LVM asking for call back. Left office number in message.  °

## 2019-02-23 NOTE — Addendum Note (Signed)
Addended by: Bertram Savin on: 02/23/2019 04:30 PM   Modules accepted: Orders

## 2019-02-23 NOTE — Telephone Encounter (Signed)
Order placed for Toradol 60 mg IM once daily prn migraine, #4 doses (8 mL) per month. Dr. Lucia Gaskins aware pt also has been prescribed oral Toradol by PCP. Ok to order IM Toradol but will advise pt that she cannot take both forms of Toradol d/t risks of adverse effects. Pt should only take Toradol a max of 4 days per month.

## 2019-02-23 NOTE — Telephone Encounter (Signed)
Medrol dose pack ordered x 1 per v.o. Dr. Lucia Gaskins.

## 2019-02-23 NOTE — Telephone Encounter (Signed)
Pt called back. RN not available. Please call back as soon as available. °

## 2019-02-24 ENCOUNTER — Encounter: Payer: Self-pay | Admitting: *Deleted

## 2019-02-24 ENCOUNTER — Other Ambulatory Visit: Payer: Self-pay

## 2019-02-24 ENCOUNTER — Ambulatory Visit (INDEPENDENT_AMBULATORY_CARE_PROVIDER_SITE_OTHER): Payer: Medicare Other | Admitting: *Deleted

## 2019-02-24 ENCOUNTER — Other Ambulatory Visit: Payer: Self-pay | Admitting: Neurology

## 2019-02-24 VITALS — HR 72 | Wt 131.0 lb

## 2019-02-24 DIAGNOSIS — Z0000 Encounter for general adult medical examination without abnormal findings: Secondary | ICD-10-CM | POA: Diagnosis not present

## 2019-02-24 MED ORDER — LASMIDITAN SUCCINATE 50 MG PO TABS: 50.0000 mg | ORAL_TABLET | Freq: Every day | ORAL | 6 refills | Status: DC | PRN

## 2019-02-24 NOTE — Telephone Encounter (Signed)
I called in the [place]. She will need to find the copay card online and register for it since insurance may decline. This is a new drug so sometimes there are hiccups in getting it, please ask for her patience and tell her to give [place] feedback. thanks

## 2019-02-24 NOTE — Patient Instructions (Signed)
Ms. Holsclaw , Thank you for taking time to come for your Medicare Wellness Visit. I appreciate your ongoing commitment to your health goals. Please review the following plan we discussed and let me know if I can assist you in the future. Please schedule your next medicare wellness visit with me in 1 yr. Continue doing brain stimulating activities (puzzles, reading, adult coloring books, staying active) to keep memory sharp.  Health Maintenance, Female Adopting a healthy lifestyle and getting preventive care can go a long way to promote health and wellness. Talk with your health care provider about what schedule of regular examinations is right for you. This is a good chance for you to check in with your provider about disease prevention and staying healthy. In between checkups, there are plenty of things you can do on your own. Experts have done a lot of research about which lifestyle changes and preventive measures are most likely to keep you healthy. Ask your health care provider for more information. Weight and diet Eat a healthy diet  Be sure to include plenty of vegetables, fruits, low-fat dairy products, and lean protein.  Do not eat a lot of foods high in solid fats, added sugars, or salt.  Get regular exercise. This is one of the most important things you can do for your health. ? Most adults should exercise for at least 150 minutes each week. The exercise should increase your heart rate and make you sweat (moderate-intensity exercise). ? Most adults should also do strengthening exercises at least twice a week. This is in addition to the moderate-intensity exercise. Maintain a healthy weight  Body mass index (BMI) is a measurement that can be used to identify possible weight problems. It estimates body fat based on height and weight. Your health care provider can help determine your BMI and help you achieve or maintain a healthy weight.  For females 39 years of age and older: ? A BMI  below 18.5 is considered underweight. ? A BMI of 18.5 to 24.9 is normal. ? A BMI of 25 to 29.9 is considered overweight. ? A BMI of 30 and above is considered obese. Watch levels of cholesterol and blood lipids  You should start having your blood tested for lipids and cholesterol at 41 years of age, then have this test every 5 years.  You may need to have your cholesterol levels checked more often if: ? Your lipid or cholesterol levels are high. ? You are older than 41 years of age. ? You are at high risk for heart disease. Cancer screening Lung Cancer  Lung cancer screening is recommended for adults 8-41 years old who are at high risk for lung cancer because of a history of smoking.  A yearly low-dose CT scan of the lungs is recommended for people who: ? Currently smoke. ? Have quit within the past 15 years. ? Have at least a 30-pack-year history of smoking. A pack year is smoking an average of one pack of cigarettes a day for 1 year.  Yearly screening should continue until it has been 15 years since you quit.  Yearly screening should stop if you develop a health problem that would prevent you from having lung cancer treatment. Breast Cancer  Practice breast self-awareness. This means understanding how your breasts normally appear and feel.  It also means doing regular breast self-exams. Let your health care provider know about any changes, no matter how small.  If you are in your 20s or 30s, you should  have a clinical breast exam (CBE) by a health care provider every 1-3 years as part of a regular health exam.  If you are 12 or older, have a CBE every year. Also consider having a breast X-ray (mammogram) every year.  If you have a family history of breast cancer, talk to your health care provider about genetic screening.  If you are at high risk for breast cancer, talk to your health care provider about having an MRI and a mammogram every year.  Breast cancer gene (BRCA)  assessment is recommended for women who have family members with BRCA-related cancers. BRCA-related cancers include: ? Breast. ? Ovarian. ? Tubal. ? Peritoneal cancers.  Results of the assessment will determine the need for genetic counseling and BRCA1 and BRCA2 testing. Cervical Cancer Your health care provider may recommend that you be screened regularly for cancer of the pelvic organs (ovaries, uterus, and vagina). This screening involves a pelvic examination, including checking for microscopic changes to the surface of your cervix (Pap test). You may be encouraged to have this screening done every 3 years, beginning at age 28.  For women ages 32-65, health care providers may recommend pelvic exams and Pap testing every 3 years, or they may recommend the Pap and pelvic exam, combined with testing for human papilloma virus (HPV), every 5 years. Some types of HPV increase your risk of cervical cancer. Testing for HPV may also be done on women of any age with unclear Pap test results.  Other health care providers may not recommend any screening for nonpregnant women who are considered low risk for pelvic cancer and who do not have symptoms. Ask your health care provider if a screening pelvic exam is right for you.  If you have had past treatment for cervical cancer or a condition that could lead to cancer, you need Pap tests and screening for cancer for at least 20 years after your treatment. If Pap tests have been discontinued, your risk factors (such as having a new sexual partner) need to be reassessed to determine if screening should resume. Some women have medical problems that increase the chance of getting cervical cancer. In these cases, your health care provider may recommend more frequent screening and Pap tests. Colorectal Cancer  This type of cancer can be detected and often prevented.  Routine colorectal cancer screening usually begins at 41 years of age and continues through 41 years  of age.  Your health care provider may recommend screening at an earlier age if you have risk factors for colon cancer.  Your health care provider may also recommend using home test kits to check for hidden blood in the stool.  A small camera at the end of a tube can be used to examine your colon directly (sigmoidoscopy or colonoscopy). This is done to check for the earliest forms of colorectal cancer.  Routine screening usually begins at age 4.  Direct examination of the colon should be repeated every 5-10 years through 41 years of age. However, you may need to be screened more often if early forms of precancerous polyps or small growths are found. Skin Cancer  Check your skin from head to toe regularly.  Tell your health care provider about any new moles or changes in moles, especially if there is a change in a mole's shape or color.  Also tell your health care provider if you have a mole that is larger than the size of a pencil eraser.  Always use sunscreen. Apply  sunscreen liberally and repeatedly throughout the day.  Protect yourself by wearing long sleeves, pants, a wide-brimmed hat, and sunglasses whenever you are outside. Heart disease, diabetes, and high blood pressure  High blood pressure causes heart disease and increases the risk of stroke. High blood pressure is more likely to develop in: ? People who have blood pressure in the high end of the normal range (130-139/85-89 mm Hg). ? People who are overweight or obese. ? People who are African American.  If you are 89-22 years of age, have your blood pressure checked every 3-5 years. If you are 31 years of age or older, have your blood pressure checked every year. You should have your blood pressure measured twice-once when you are at a hospital or clinic, and once when you are not at a hospital or clinic. Record the average of the two measurements. To check your blood pressure when you are not at a hospital or clinic, you can  use: ? An automated blood pressure machine at a pharmacy. ? A home blood pressure monitor.  If you are between 79 years and 37 years old, ask your health care provider if you should take aspirin to prevent strokes.  Have regular diabetes screenings. This involves taking a blood sample to check your fasting blood sugar level. ? If you are at a normal weight and have a low risk for diabetes, have this test once every three years after 41 years of age. ? If you are overweight and have a high risk for diabetes, consider being tested at a younger age or more often. Preventing infection Hepatitis B  If you have a higher risk for hepatitis B, you should be screened for this virus. You are considered at high risk for hepatitis B if: ? You were born in a country where hepatitis B is common. Ask your health care provider which countries are considered high risk. ? Your parents were born in a high-risk country, and you have not been immunized against hepatitis B (hepatitis B vaccine). ? You have HIV or AIDS. ? You use needles to inject street drugs. ? You live with someone who has hepatitis B. ? You have had sex with someone who has hepatitis B. ? You get hemodialysis treatment. ? You take certain medicines for conditions, including cancer, organ transplantation, and autoimmune conditions. Hepatitis C  Blood testing is recommended for: ? Everyone born from [birth year] through 1963. ? Anyone with known risk factors for hepatitis C. Sexually transmitted infections (STIs)  You should be screened for sexually transmitted infections (STIs) including gonorrhea and chlamydia if: ? You are sexually active and are younger than 41 years of age. ? You are older than 41 years of age and your health care provider tells you that you are at risk for this type of infection. ? Your sexual activity has changed since you were last screened and you are at an increased risk for chlamydia or gonorrhea. Ask your health care  provider if you are at risk.  If you do not have HIV, but are at risk, it may be recommended that you take a prescription medicine daily to prevent HIV infection. This is called pre-exposure prophylaxis (PrEP). You are considered at risk if: ? You are sexually active and do not regularly use condoms or know the HIV status of your partner(s). ? You take drugs by injection. ? You are sexually active with a partner who has HIV. Talk with your health care provider about whether you are  at high risk of being infected with HIV. If you choose to begin PrEP, you should first be tested for HIV. You should then be tested every 3 months for as long as you are taking PrEP. Pregnancy  If you are premenopausal and you may become pregnant, ask your health care provider about preconception counseling.  If you may become pregnant, take 400 to 800 micrograms (mcg) of folic acid every day.  If you want to prevent pregnancy, talk to your health care provider about birth control (contraception). Osteoporosis and menopause  Osteoporosis is a disease in which the bones lose minerals and strength with aging. This can result in serious bone fractures. Your risk for osteoporosis can be identified using a bone density scan.  If you are 49 years of age or older, or if you are at risk for osteoporosis and fractures, ask your health care provider if you should be screened.  Ask your health care provider whether you should take a calcium or vitamin D supplement to lower your risk for osteoporosis.  Menopause may have certain physical symptoms and risks.  Hormone replacement therapy may reduce some of these symptoms and risks. Talk to your health care provider about whether hormone replacement therapy is right for you. Follow these instructions at home:  Schedule regular health, dental, and eye exams.  Stay current with your immunizations.  Do not use any tobacco products including cigarettes, chewing tobacco, or  electronic cigarettes.  If you are pregnant, do not drink alcohol.  If you are breastfeeding, limit how much and how often you drink alcohol.  Limit alcohol intake to no more than 1 drink per day for nonpregnant women. One drink equals 12 ounces of beer, 5 ounces of wine, or 1 ounces of hard liquor.  Do not use street drugs.  Do not share needles.  Ask your health care provider for help if you need support or information about quitting drugs.  Tell your health care provider if you often feel depressed.  Tell your health care provider if you have ever been abused or do not feel safe at home. This information is not intended to replace advice given to you by your health care provider. Make sure you discuss any questions you have with your health care provider. Document Released: 05/21/2011 Document Revised: 04/12/2016 Document Reviewed: 08/09/2015 Elsevier Interactive Patient Education  2019 [employer].

## 2019-02-24 NOTE — Telephone Encounter (Signed)
Sent mychart message

## 2019-02-25 NOTE — Telephone Encounter (Signed)
PA for Reyvow ([place]) 50 mg tablet completed on Cover My Meds. KEY: [id number]. Awaiting determination from [facility].

## 2019-03-02 NOTE — Telephone Encounter (Addendum)
Received approval through 11/19/2019 for Reyvow 50 mg however only a quantity of 4 per 30 days is allowed, not 10. Dr. Lucia Gaskins aware. Will email pt with update.    Faxed approval notice to [facility] pharmacy. Received a receipt of confirmation.

## 2019-03-03 ENCOUNTER — Ambulatory Visit: Payer: Medicare Other | Admitting: Neurology

## 2019-03-06 ENCOUNTER — Other Ambulatory Visit: Payer: Self-pay | Admitting: Osteopathic Medicine

## 2019-03-06 NOTE — Telephone Encounter (Signed)
Routing to Dr Alexander's rx refill pool 

## 2019-03-23 ENCOUNTER — Other Ambulatory Visit: Payer: Self-pay | Admitting: Osteopathic Medicine

## 2019-03-23 DIAGNOSIS — G43409 Hemiplegic migraine, not intractable, without status migrainosus: Secondary | ICD-10-CM

## 2019-05-06 ENCOUNTER — Ambulatory Visit (INDEPENDENT_AMBULATORY_CARE_PROVIDER_SITE_OTHER): Payer: Medicare Other | Admitting: Neurology

## 2019-05-06 ENCOUNTER — Telehealth: Payer: Self-pay | Admitting: Neurology

## 2019-05-06 ENCOUNTER — Other Ambulatory Visit: Payer: Self-pay

## 2019-05-06 DIAGNOSIS — G43711 Chronic migraine without aura, intractable, with status migrainosus: Secondary | ICD-10-CM

## 2019-05-06 MED ORDER — NURTEC 75 MG PO TBDP: 75.0000 mg | ORAL_TABLET | Freq: Every day | ORAL | 0 refills | Status: DC | PRN

## 2019-05-06 NOTE — Progress Notes (Signed)
Consent Form Botulism Toxin Injection For Chronic Migraine  This is our third botox. Doing very well, > 50% improvement in frequency of migraines. They have increased since our botox was delayed due to covid. Ubrelvy caused stomach discomfort. Lasmiditan did not work. Try Nurtec.   Meds ordered this encounter  Medications  . Rimegepant Sulfate (NURTEC) 75 MG TBDP    Sig: Take 75 mg by mouth daily as needed.    Dispense:  2 tablet    Refill:  0     Reviewed orally with patient, additionally signature is on file:  Botulism toxin has been approved by the Federal drug administration for treatment of chronic migraine. Botulism toxin does not cure chronic migraine and it may not be effective in some patients.  The administration of botulism toxin is accomplished by injecting a small amount of toxin into the muscles of the neck and head. Dosage must be titrated for each individual. Any benefits resulting from botulism toxin tend to wear off after 3 months with a repeat injection required if benefit is to be maintained. Injections are usually done every 3-4 months with maximum effect peak achieved by about 2 or 3 weeks. Botulism toxin is expensive and you should be sure of what costs you will incur resulting from the injection.  The side effects of botulism toxin use for chronic migraine may include:   -Transient, and usually mild, facial weakness with facial injections  -Transient, and usually mild, head or neck weakness with head/neck injections  -Reduction or loss of forehead facial animation due to forehead muscle weakness  -Eyelid drooping  -Dry eye  -Pain at the site of injection or bruising at the site of injection  -Double vision  -Potential unknown long term risks  Contraindications: You should not have Botox if you are pregnant, nursing, allergic to albumin, have an infection, skin condition, or muscle weakness at the site of the injection, or have myasthenia gravis,  Lambert-Eaton syndrome, or ALS.  It is also possible that as with any injection, there may be an allergic reaction or no effect from the medication. Reduced effectiveness after repeated injections is sometimes seen and rarely infection at the injection site may occur. All care will be taken to prevent these side effects. If therapy is given over a long time, atrophy and wasting in the muscle injected may occur. Occasionally the patient's become refractory to treatment because they develop antibodies to the toxin. In this event, therapy needs to be modified.  I have read the above information and consent to the administration of botulism toxin.    BOTOX PROCEDURE NOTE FOR MIGRAINE HEADACHE    Contraindications and precautions discussed with patient(above). Aseptic procedure was observed and patient tolerated procedure. Procedure performed by Dr. Artemio Alyoni Ahern  The condition has existed for more than 6 months, and pt does not have a diagnosis of ALS, Myasthenia Gravis or Lambert-Eaton Syndrome.  Risks and benefits of injections discussed and pt agrees to proceed with the procedure.  Written consent obtained  These injections are medically necessary. Pt  receives good benefits from these injections. These injections do not cause sedations or hallucinations which the oral therapies may cause.  Description of procedure:  The patient was placed in a sitting position. The standard protocol was used for Botox as follows, with 5 units of Botox injected at each site:   -Procerus muscle, midline injection  -Corrugator muscle, bilateral injection  -Frontalis muscle, bilateral injection, with 2 sites each side, medial injection was  performed in the upper one third of the frontalis muscle, in the region vertical from the medial inferior edge of the superior orbital rim. The lateral injection was again in the upper one third of the forehead vertically above the lateral limbus of the cornea, 1.5 cm lateral  to the medial injection site.  - Levator Scapulae: 5 units bilaterally  -Temporalis muscle injection, 5 sites, bilaterally. The first injection was 3 cm above the tragus of the ear, second injection site was 1.5 cm to 3 cm up from the first injection site in line with the tragus of the ear. The third injection site was 1.5-3 cm forward between the first 2 injection sites. The fourth injection site was 1.5 cm posterior to the second injection site. 5th site laterally in the temporalis  muscleat the level of the outer canthus.  - Patient feels her clenching is a trigger for headaches. +5 units masseter bilaterally   - Patient feels the migraines are centered around the eyes +5 units bilaterally at the outer canthus in the orbicularis occuli  -Occipitalis muscle injection, 3 sites, bilaterally. The first injection was done one half way between the occipital protuberance and the tip of the mastoid process behind the ear. The second injection site was done lateral and superior to the first, 1 fingerbreadth from the first injection. The third injection site was 1 fingerbreadth superiorly and medially from the first injection site.  -Cervical paraspinal muscle injection, 2 sites, bilateral knee first injection site was 1 cm from the midline of the cervical spine, 3 cm inferior to the lower border of the occipital protuberance. The second injection site was 1.5 cm superiorly and laterally to the first injection site.  -Trapezius muscle injection was performed at 3 sites, bilaterally. The first injection site was in the upper trapezius muscle halfway between the inflection point of the neck, and the acromion. The second injection site was one half way between the acromion and the first injection site. The third injection was done between the first injection site and the inflection point of the neck.   Will return for repeat injection in 3 months.   A 200 unit sof Botox was used, any Botox not injected was  wasted. The patient tolerated the procedure well, there were no complications of the above procedure.

## 2019-05-06 NOTE — Telephone Encounter (Signed)
I called the patients insurance to check eligibility and pre cert requirements. I spoke with Will who stated that the codes [phone] and [phone] were covered and billable in office NPR eligible for B/B. EFE#0712. DW

## 2019-05-06 NOTE — Patient Instructions (Signed)
Rimegepant: Patient drug information Access Lexicomp Online here. Copyright [phone] [employer] All rights reserved. (For additional information see "Rimegepant: Drug information") Brand Names: [place]  Nurtec  What is this drug used for?   It is used to treat migraine headaches.  What do I need to tell my doctor BEFORE I take this drug?   If you are allergic to this drug; any part of this drug; or any other drugs, foods, or substances. Tell your doctor about the allergy and what signs you had.   If you have any of these health problems: Kidney disease or liver disease.   If you take any drugs (prescription or OTC, natural products, vitamins) that must not be taken with this drug, like certain drugs that are used for HIV, infections, or seizures. There are many drugs that must not be taken with this drug.   This is not a list of all drugs or health problems that interact with this drug.   Tell your doctor and pharmacist about all of your drugs (prescription or OTC, natural products, vitamins) and health problems. You must check to make sure that it is safe for you to take this drug with all of your drugs and health problems. Do not start, stop, or change the dose of any drug without checking with your doctor.  What are some things I need to know or do while I take this drug?   Tell all of your health care providers that you take this drug. This includes your doctors, nurses, pharmacists, and dentists.   This drug is not meant to prevent or lower the number of migraine headaches you get.   Tell your doctor if you are pregnant, plan on getting pregnant, or are breast-feeding. You will need to talk about the benefits and risks to you and the baby.  What are some side effects that I need to call my doctor about right away?   WARNING/CAUTION: Even though it may be rare, some people may have very bad and sometimes deadly side effects when taking a drug. Tell your doctor or get medical help right  away if you have any of the following signs or symptoms that may be related to a very bad side effect:   Signs of an allergic reaction, like rash; hives; itching; red, swollen, blistered, or peeling skin with or without fever; wheezing; tightness in the chest or throat; trouble breathing, swallowing, or talking; unusual hoarseness; or swelling of the mouth, face, lips, tongue, or throat.  What are some other side effects of this drug?   All drugs may cause side effects. However, many people have no side effects or only have minor side effects. Call your doctor or get medical help if any of these side effects or any other side effects bother you or do not go away:   Upset stomach.   These are not all of the side effects that may occur. If you have questions about side effects, call your doctor. Call your doctor for medical advice about side effects.   You may report side effects to your national health agency.  How is this drug best taken?   Use this drug as ordered by your doctor. Read all information given to you. Follow all instructions closely.   Do not push the tablet out of the foil when opening. Use dry hands to take it from the foil. Place on your tongue and let it dissolve. Water is not needed. Do not swallow it whole. Do  not chew, break, or crush it.   If needed, you may place the tablet under the tongue.   Use right after opening.  What do I do if I miss a dose?   This drug is taken on an as needed basis. Do not take more often than told by the doctor.  How do I store and/or throw out this drug?   Store at room temperature in a dry place. Do not store in a bathroom.   Store in foil pouch until ready for use.   Keep all drugs in a safe place. Keep all drugs out of the reach of children and pets.   Throw away unused or expired drugs. Do not flush down a toilet or pour down a drain unless you are told to do so. Check with your pharmacist if you have questions about the best way to throw  out drugs. There may be drug take-back programs in your area.  General drug facts   If your symptoms or health problems do not get better or if they become worse, call your doctor.   Do not share your drugs with others and do not take anyone else's drugs.   Some drugs may have another patient information leaflet. If you have any questions about this drug, please talk with your doctor, nurse, pharmacist, or other health care provider.   If you think there has been an overdose, call your poison control center or get medical care right away. Be ready to tell or show what was taken, how much, and when it happened.  Use of UpToDate is subject to the

## 2019-05-06 NOTE — Progress Notes (Signed)
Botox- 200 units x 1 vial Lot: [id number] Expiration: 09/2021 NDC: 0023-1145-01  Bacteriostatic 0.9% Sodium Chloride- 4mL total Lot: [id number] Expiration: 08/20/2019 NDC: 0409-1966-02  Dx: G43.711 B/B   

## 2019-05-18 ENCOUNTER — Other Ambulatory Visit: Payer: Self-pay | Admitting: Neurology

## 2019-05-18 MED ORDER — KETOROLAC TROMETHAMINE 60 MG/2ML IM SOLN: INTRAMUSCULAR | 3 refills | Status: DC

## 2019-05-18 MED ORDER — PROMETHAZINE HCL 25 MG RE SUPP: 25.0000 mg | Freq: Four times a day (QID) | RECTAL | 6 refills | Status: DC | PRN

## 2019-05-19 ENCOUNTER — Other Ambulatory Visit: Payer: Self-pay | Admitting: Neurology

## 2019-05-19 ENCOUNTER — Telehealth: Payer: Self-pay | Admitting: *Deleted

## 2019-05-19 ENCOUNTER — Encounter: Payer: Self-pay | Admitting: *Deleted

## 2019-05-19 MED ORDER — KETOROLAC TROMETHAMINE 60 MG/2ML IM SOLN: INTRAMUSCULAR | 3 refills | Status: DC

## 2019-05-19 NOTE — Telephone Encounter (Signed)
Holly [name] from [facility] called. They cannot order the Toradol 60 mg/2 mL vial. They can get the 15 mg/1 mL vial but it comes in a pack of 25 only. Requests call back with alternate orders from Dr. Jaynee Eagles.

## 2019-05-19 NOTE — Telephone Encounter (Signed)
Spoke with Dr. Jaynee Eagles. Will try to send the Toradol 60 mg to [facility] which is also on pt's list.

## 2019-05-19 NOTE — Telephone Encounter (Signed)
Sent pt mychart message letting her know the Toradol was sent to [facility].

## 2019-05-20 ENCOUNTER — Other Ambulatory Visit: Payer: Self-pay | Admitting: Neurology

## 2019-05-21 ENCOUNTER — Other Ambulatory Visit: Payer: Self-pay | Admitting: *Deleted

## 2019-05-21 ENCOUNTER — Encounter: Payer: Self-pay | Admitting: *Deleted

## 2019-05-21 ENCOUNTER — Other Ambulatory Visit: Payer: Self-pay | Admitting: Neurology

## 2019-05-21 ENCOUNTER — Telehealth: Payer: Self-pay | Admitting: Neurology

## 2019-05-21 MED ORDER — METHYLPREDNISOLONE 4 MG PO TBPK: ORAL_TABLET | ORAL | 0 refills | Status: DC

## 2019-05-21 MED ORDER — KETOROLAC TROMETHAMINE 60 MG/2ML IM SOLN: INTRAMUSCULAR | 3 refills | Status: DC

## 2019-05-21 NOTE — Telephone Encounter (Signed)
Pt called in and stated the [facility] stated they cant fill Toradol it would need to be sent to [facility], [place] - [address]

## 2019-05-21 NOTE — Telephone Encounter (Signed)
Pt is requesting a refill of methylPREDNISolone (MEDROL DOSEPAK) 4 MG TBPK tablet , to be sent to [place], [place]

## 2019-05-21 NOTE — Telephone Encounter (Signed)
Dr. Jaynee Eagles aware. I sent the Toradol over to [facility] in [place].

## 2019-05-21 NOTE — Telephone Encounter (Signed)
I spoke with Dr. Jaynee Eagles. She approved another refill of the Medrol dose pack. Refill sent to [facility].

## 2019-06-08 ENCOUNTER — Other Ambulatory Visit: Payer: Self-pay | Admitting: Neurology

## 2019-06-08 ENCOUNTER — Encounter: Payer: Self-pay | Admitting: Osteopathic Medicine

## 2019-06-09 ENCOUNTER — Ambulatory Visit (INDEPENDENT_AMBULATORY_CARE_PROVIDER_SITE_OTHER): Payer: Medicare Other | Admitting: Physician Assistant

## 2019-06-09 ENCOUNTER — Encounter: Payer: Self-pay | Admitting: Physician Assistant

## 2019-06-09 VITALS — BP 115/84 | HR 96 | Temp 98.3°F | Ht 67.0 in | Wt 128.0 lb

## 2019-06-09 DIAGNOSIS — H9201 Otalgia, right ear: Secondary | ICD-10-CM | POA: Diagnosis not present

## 2019-06-09 MED ORDER — FLUTICASONE PROPIONATE 50 MCG/ACT NA SUSP: 2.0000 | Freq: Every day | NASAL | 1 refills | Status: DC

## 2019-06-09 MED ORDER — OFLOXACIN 0.3 % OT SOLN: 10.0000 [drp] | Freq: Every day | OTIC | 0 refills | Status: DC

## 2019-06-09 NOTE — Progress Notes (Signed)
Patient ID: Holly [name], female   DOB: 15-Oct-1976, 43 y.o.   MRN: [id number] .Marland Kitchen.Virtual Visit via Video Note  I connected with Holly [name] on 06/10/19 at  3:20 PM EDT by a video enabled telemedicine application and verified that I am speaking with the correct person using two identifiers.  Location: Patient: home Provider: home   I discussed the limitations of evaluation and management by telemedicine and the availability of in person appointments. The patient expressed understanding and agreed to proceed.  History of Present Illness: Pt is a 43 yo female with migraines, allergic rhinitis who calls into the clinic with right ear pain. She noticed symptoms starting yesterday and they progressively getting worse. At first felt more like "fluid trapped in her ear". She could "feel it moving back and forth with head changes". She now feels a lot o pressure and external tenderness to touch. Feels like "needles over tragus". She has taken benadryl and sudafed with no benefit. She has some pressure behind eyes today. No fever, chills, body aches, SOB. No other sick contacts. No hx of recent swimming.   .. Active Ambulatory Problems    Diagnosis Date Noted  . Intractable chronic migraine without aura and with status migrainosus 04/08/2016  . Chronic seasonal allergic rhinitis 11/27/2016  . Depression, recurrent (HCC) 01/09/2017  . Abnormal Pap smear of cervix 02/26/2017  . Acute pain of left knee 05/27/2017  . 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 Fibromyalgia syndrome 09/30/2017  . Menometrorrhagia 01/20/2018  . Dysmenorrhea 01/20/2018  . Rib pain on right side 02/26/2018   Resolved Ambulatory Problems    Diagnosis Date Noted  . GERD 04/27/2010  . WOUND OPEN, KNEE/LEG/ANKLE W/O COMPLICATION 04/27/2010  . Normal labor 07/07/2015  . NSVD (normal spontaneous vaginal delivery) 07/07/2015   Past Medical History:  Diagnosis Date  . Depression   . Fibromyalgia 2018  . GERD  (gastroesophageal reflux disease)   . Hemiplegia (HCC)   . IBS (irritable bowel syndrome)   . Migraine   . MRSA (methicillin resistant staph aureus) culture positive   . Vaginal Pap smear, abnormal    Reviewed med, allergy, problem list.      Observations/Objective: No acute distress. Normal breathing. No coughing observed.  When asked to pull up on right ear auricle and press over tragus was painful.   .. Today's Vitals   06/09/19 1440  Temp: 98.3 F (36.8 C)  TempSrc: Oral  Weight: 128 lb (58.1 kg)  Height: 5\' 7"  (1.702 m)   Body mass index is 20.05 kg/m.    Assessment and Plan: Marland Kitchen.Marland Kitchen.[name] was seen today for ear pain.  Diagnoses and all orders for this visit:  Right ear pain -     fluticasone (FLONASE) 50 MCG/ACT nasal spray; Place 2 sprays into both nostrils daily. -     ofloxacin (FLOXIN) 0.3 % OTIC solution; Place 10 drops into the right ear daily. For 7 days.   Being virtual I cannot look in ear. With tenderness over tragus and auricle I suspect more external otitis externa. Certainly could be some ETD as well.  Start ofloxacin drops for 7 days and flonase. If not improving needs to come in for office visit to look in ears. If developing more sinus symptoms can certainly up date us as well.   Follow Up Instructions:    I discussed the assessment and treatment plan with the patient. The patient was provided an opportunity to ask questions and all were answered. The patient agreed with  the plan and demonstrated an understanding of the instructions.   The patient was advised to call back or seek an in-person evaluation if the symptoms worsen or if the condition fails to improve as anticipated.     Iran Planas, PA-C

## 2019-06-09 NOTE — Progress Notes (Signed)
Woke up feeling like she had been swimming. Pain in right ear/jaw. Has tried sudafed and benadryl with no help. Feels fluid moving around. Having a headache now/ pressure behind eyes.

## 2019-06-10 ENCOUNTER — Other Ambulatory Visit: Payer: Self-pay | Admitting: Neurology

## 2019-06-10 ENCOUNTER — Encounter: Payer: Self-pay | Admitting: Physician Assistant

## 2019-06-10 MED ORDER — ONDANSETRON 4 MG PO TBDP: 4.0000 mg | ORAL_TABLET | Freq: Three times a day (TID) | ORAL | 11 refills | Status: DC | PRN

## 2019-06-12 ENCOUNTER — Ambulatory Visit (INDEPENDENT_AMBULATORY_CARE_PROVIDER_SITE_OTHER): Payer: Medicare Other | Admitting: Osteopathic Medicine

## 2019-06-12 ENCOUNTER — Encounter: Payer: Self-pay | Admitting: Osteopathic Medicine

## 2019-06-12 VITALS — BP 105/70 | HR 99 | Temp 97.5°F | Wt 131.0 lb

## 2019-06-12 DIAGNOSIS — F331 Major depressive disorder, recurrent, moderate: Secondary | ICD-10-CM

## 2019-06-12 MED ORDER — FLUOXETINE HCL 20 MG PO CAPS: 20.0000 mg | ORAL_CAPSULE | Freq: Every day | ORAL | 0 refills | Status: DC

## 2019-06-12 MED ORDER — TRIAMCINOLONE ACETONIDE 0.1 % EX CREA: 1.0000 "application " | TOPICAL_CREAM | Freq: Two times a day (BID) | CUTANEOUS | 1 refills | Status: DC

## 2019-06-12 NOTE — Progress Notes (Signed)
Virtual Visit via Video  I connected with      Holly [name] on 06/12/19 at 9:50 AM by a telemedicine application and verified that I am speaking with the correct person using two identifiers.  Patient is at home I am in office   I discussed the limitations of evaluation and management by telemedicine and the availability of in person appointments. The patient expressed understanding and agreed to proceed.  History of Present Illness: Holly [name] is a 43 y.o. female who would like to discuss depression     Holly [name] has gone through a great deal lately, her eldest child was recently diagnosed with cancer and they just turned 18 so she is no longer able to be with them in the hospital.  Her second oldest has started running away and accusing her parents of abuse, so CPS is involved.  There have been issues with disability and also government assistance for 1 of her other children with cognitive disabilities, checks are no longer coming in because the child is working.  At any rate, she is going through a lot.  Zoloft does not really seem to be making much difference at all.  She has not felt particularly anxious/overwhelmed, just constantly feeling exhausted mentally and physically, underappreciated by her family, overwhelmed with managing household and family on top of all the drama noted above.     Observations/Objective: BP 105/70 (Patient Position: Sitting, Cuff Size: Normal)   Pulse 99   Temp (!) 97.5 F (36.4 C) (Oral)   Wt 131 lb (59.4 kg)   BMI 20.52 kg/m  BP Readings from Last 3 Encounters:  06/12/19 105/70  06/09/19 115/84  12/12/18 112/67   Exam: Normal Speech.  NAD  Lab and Radiology Results No results found for this or any previous visit (from the past 72 hour(s)). No results found.     Assessment and Plan: 43 y.o. female with The encounter diagnosis was Moderate episode of recurrent major depressive disorder (HCC).  Will trial  discontinuation of Zoloft and switch over to Prozac, her mom has been on this medication with good success for many years.  Patient declines refill of clonazepam for now.  PDMP not reviewed this encounter. No orders of the defined types were placed in this encounter.  Meds ordered this encounter  Medications  . FLUoxetine (PROZAC) 20 MG capsule    Sig: Take 1 capsule (20 mg total) by mouth daily.    Dispense:  90 capsule    Refill:  0  . triamcinolone cream (KENALOG) 0.1 %    Sig: Apply 1 application topically 2 (two) times daily. To affected area(s) as needed    Dispense:  45 g    Refill:  1   There are no Patient Instructions on file for this visit.  Instructions sent via MyChart. If MyChart not available, pt was given option for info via personal e-mail w/ no guarantee of protected health info over unsecured e-mail communication, and MyChart sign-up instructions were included.   Follow Up Instructions: Return in about 4 weeks (around 07/10/2019) for RECHECK ON NEW MEDICATION.    I discussed the assessment and treatment plan with the patient. The patient was provided an opportunity to ask questions and all were answered. The patient agreed with the plan and demonstrated an understanding of the instructions.   The patient was advised to call back or seek an in-person evaluation if any new concerns, if symptoms worsen or if the condition fails  to improve as anticipated.  25 minutes of non-face-to-face time was provided during this encounter.                      Historical information moved to improve visibility of documentation.  Past Medical History:  Diagnosis Date  . Depression   . Fibromyalgia 2018  . GERD (gastroesophageal reflux disease)   . Hemiplegia (HCC)   . IBS (irritable bowel syndrome)   . Migraine   . MRSA (methicillin resistant staph aureus) culture positive   . Vaginal Pap smear, abnormal    Past Surgical History:  Procedure Laterality  Date  . WRIST SURGERY     cyst removal   Social History   Tobacco Use  . Smoking status: Never Smoker  . Smokeless tobacco: Never Used  Substance Use Topics  . Alcohol use: No    Alcohol/week: 0.0 standard drinks    Comment: none   family history includes Autoimmune disease in her father; Cancer in her sister; Crohn's disease in her father; Depression in her mother and sister; Diabetes in her father, paternal grandfather, and sister; Factor V Leiden deficiency in her father; Heart attack in her father and mother; Heart failure in her father; Hyperlipidemia in her father and mother; Hypertension in her mother; Migraines in her mother; Scoliosis in her mother; Thyroid disease in her mother.  Medications: Current Outpatient Medications  Medication Sig Dispense Refill  . butalbital-acetaminophen-caffeine (FIORICET, ESGIC) 50-325-40 MG tablet Take 1-2 tablets at onset of migraine every 6 hours as needed. 15 tablet 4  . fluticasone (FLONASE) 50 MCG/ACT nasal spray Place 2 sprays into both nostrils daily. 16 g 1  . Galcanezumab-gnlm (EMGALITY) 120 MG/ML SOAJ Inject 120 mg into the skin every 30 (thirty) days. 1 pen 11  . ibuprofen (ADVIL,MOTRIN) 800 MG tablet Take 1 tablet (800 mg total) by mouth every 8 (eight) hours as needed. 60 tablet 2  . montelukast (SINGULAIR) 10 MG tablet Take 1 tablet (10 mg) by mouth daily in the evening 90 tablet 3  . norethindrone (MICRONOR) 0.35 MG tablet TAKE ONE TABLET BY MOUTH EVERY DAY NEEDS APPOINTMENT BEFORE MORE REFILLS 84 tablet 1  . ofloxacin (FLOXIN) 0.3 % OTIC solution Place 10 drops into the right ear daily. For 7 days. 10 mL 0  . omeprazole (PRILOSEC) 40 MG capsule TAKE ONE CAPSULE DAILY 90 capsule 2  . ondansetron (ZOFRAN-ODT) 4 MG disintegrating tablet Take 1 tablet (4 mg total) by mouth every 8 (eight) hours as needed for nausea. 30 tablet 11  . prochlorperazine (COMPAZINE) 10 MG tablet Take 1 tablet (10 mg total) by mouth every 6 (six) hours as  needed. 30 tablet 11  . promethazine (PHENERGAN) 25 MG suppository Place 1 suppository (25 mg total) rectally every 6 (six) hours as needed for nausea or vomiting. 12 each 6  . QC LORATADINE-D 10-240 MG 24 hr tablet TAKE ONE TABLET DAILY 30 tablet 11  . Rimegepant Sulfate (NURTEC) 75 MG TBDP Take 75 mg by mouth daily as needed. 2 tablet 0  . Topiramate ER (TROKENDI XR) 100 MG CP24 Take 100 mg by mouth at bedtime. 30 capsule 6  . FLUoxetine (PROZAC) 20 MG capsule Take 1 capsule (20 mg total) by mouth daily. 90 capsule 0  . triamcinolone cream (KENALOG) 0.1 % Apply 1 application topically 2 (two) times daily. To affected area(s) as needed 45 g 1   No current facility-administered medications for this visit.    Allergies  Allergen Reactions  .  Other Rash    Overly sleepy  . Terbutaline Palpitations  . Sumatriptan   . Adhesive [Tape] Itching and Rash  . Aimovig [Erenumab-Aooe] Rash  . Celebrex [Celecoxib] Rash    03/17/18 patient is unsure, thinks it may have been shaving cream  . Cetirizine Hcl Other (See Comments)    Overly sleepy   . Ciprofloxacin Rash    PDMP not reviewed this encounter. No orders of the defined types were placed in this encounter.  Meds ordered this encounter  Medications  . FLUoxetine (PROZAC) 20 MG capsule    Sig: Take 1 capsule (20 mg total) by mouth daily.    Dispense:  90 capsule    Refill:  0  . triamcinolone cream (KENALOG) 0.1 %    Sig: Apply 1 application topically 2 (two) times daily. To affected area(s) as needed    Dispense:  45 g    Refill:  1

## 2019-07-21 ENCOUNTER — Other Ambulatory Visit: Payer: Self-pay | Admitting: *Deleted

## 2019-07-21 MED ORDER — BUTALBITAL-APAP-CAFFEINE 50-325-40 MG PO TABS: ORAL_TABLET | ORAL | 4 refills | Status: DC

## 2019-07-21 NOTE — Telephone Encounter (Signed)
Received refill request for fioricet. Pending appt 08/11/2019. Will send to Dr. Jaynee Eagles for authorization.

## 2019-07-21 NOTE — Telephone Encounter (Signed)
Faxed signed fioricet prescription to [facility]. Received a receipt of confirmation.

## 2019-08-11 ENCOUNTER — Ambulatory Visit (INDEPENDENT_AMBULATORY_CARE_PROVIDER_SITE_OTHER): Payer: Medicare Other | Admitting: Neurology

## 2019-08-11 ENCOUNTER — Other Ambulatory Visit: Payer: Self-pay

## 2019-08-11 VITALS — Temp 98.2°F

## 2019-08-11 DIAGNOSIS — G43711 Chronic migraine without aura, intractable, with status migrainosus: Secondary | ICD-10-CM | POA: Diagnosis not present

## 2019-08-11 MED ORDER — HYDROCODONE-ACETAMINOPHEN 5-300 MG PO TABS: 1.0000 | ORAL_TABLET | Freq: Four times a day (QID) | ORAL | 0 refills | Status: DC | PRN

## 2019-08-11 NOTE — Progress Notes (Signed)
Consent Form Botulism Toxin Injection For Chronic Migraine  This is our fourth botox. Doing very well, > 50% improvement in frequency of migraines. They have increased since our botox was delayed due to covid. Ubrelvy caused stomach discomfort. Lasmiditan did not work. Try Nurtec. Continue Fioricet, only acute med that works   Reviewed orally with patient, additionally signature is on file:Consent Form Botulism Toxin Injection For Chronic Migraine    Reviewed orally with patient, additionally signature is on file:  Botulism toxin has been approved by the Federal drug administration for treatment of chronic migraine. Botulism toxin does not cure chronic migraine and it may not be effective in some patients.  The administration of botulism toxin is accomplished by injecting a small amount of toxin into the muscles of the neck and head. Dosage must be titrated for each individual. Any benefits resulting from botulism toxin tend to wear off after 3 months with a repeat injection required if benefit is to be maintained. Injections are usually done every 3-4 months with maximum effect peak achieved by about 2 or 3 weeks. Botulism toxin is expensive and you should be sure of what costs you will incur resulting from the injection.  The side effects of botulism toxin use for chronic migraine may include:   -Transient, and usually mild, facial weakness with facial injections  -Transient, and usually mild, head or neck weakness with head/neck injections  -Reduction or loss of forehead facial animation due to forehead muscle weakness  -Eyelid drooping  -Dry eye  -Pain at the site of injection or bruising at the site of injection  -Double vision  -Potential unknown long term risks  Contraindications: You should not have Botox if you are pregnant, nursing, allergic to albumin, have an infection, skin condition, or muscle weakness at the site of the injection, or have myasthenia gravis,  Lambert-Eaton syndrome, or ALS.  It is also possible that as with any injection, there may be an allergic reaction or no effect from the medication. Reduced effectiveness after repeated injections is sometimes seen and rarely infection at the injection site may occur. All care will be taken to prevent these side effects. If therapy is given over a long time, atrophy and wasting in the muscle injected may occur. Occasionally the patient's become refractory to treatment because they develop antibodies to the toxin. In this event, therapy needs to be modified.  I have read the above information and consent to the administration of botulism toxin.    BOTOX PROCEDURE NOTE FOR MIGRAINE HEADACHE    Contraindications and precautions discussed with patient(above). Aseptic procedure was observed and patient tolerated procedure. Procedure performed by Dr. Georgia Dom  The condition has existed for more than 6 months, and pt does not have a diagnosis of ALS, Myasthenia Gravis or Lambert-Eaton Syndrome.  Risks and benefits of injections discussed and pt agrees to proceed with the procedure.  Written consent obtained  These injections are medically necessary. Pt  receives good benefits from these injections. These injections do not cause sedations or hallucinations which the oral therapies may cause.  Description of procedure:  The patient was placed in a sitting position. The standard protocol was used for Botox as follows, with 5 units of Botox injected at each site:   -Procerus muscle, midline injection  -Corrugator muscle, bilateral injection  -Frontalis muscle, bilateral injection, with 2 sites each side, medial injection was performed in the upper one third of the frontalis muscle, in the region vertical from the medial  inferior edge of the superior orbital rim. The lateral injection was again in the upper one third of the forehead vertically above the lateral limbus of the cornea, 1.5 cm lateral  to the medial injection site.  -Temporalis muscle injection, 4 sites, bilaterally. The first injection was 3 cm above the tragus of the ear, second injection site was 1.5 cm to 3 cm up from the first injection site in line with the tragus of the ear. The third injection site was 1.5-3 cm forward between the first 2 injection sites. The fourth injection site was 1.5 cm posterior to the second injection site.   -Occipitalis muscle injection, 3 sites, bilaterally. The first injection was done one half way between the occipital protuberance and the tip of the mastoid process behind the ear. The second injection site was done lateral and superior to the first, 1 fingerbreadth from the first injection. The third injection site was 1 fingerbreadth superiorly and medially from the first injection site.  -Cervical paraspinal muscle injection, 2 sites, bilateral knee first injection site was 1 cm from the midline of the cervical spine, 3 cm inferior to the lower border of the occipital protuberance. The second injection site was 1.5 cm superiorly and laterally to the first injection site.  -Trapezius muscle injection was performed at 3 sites, bilaterally. The first injection site was in the upper trapezius muscle halfway between the inflection point of the neck, and the acromion. The second injection site was one half way between the acromion and the first injection site. The third injection was done between the first injection site and the inflection point of the neck.   Will return for repeat injection in 3 months.   200 units of Botox was used, any Botox not injected was wasted. The patient tolerated the procedure well, there were no complications of the above procedure.

## 2019-08-11 NOTE — Progress Notes (Signed)
Botox- 100 units x 2 vials Lot: [id number] Expiration: 04/2022 NDC: 2703-5009-38 &  Lot: [id number] Exp: 12/2021 NDC: 1696-7893-81  Bacteriostatic 0.9% Sodium Chloride- 1mL total Lot: [id number] Expiration: 08/20/2019 NDC: 2585-2778-24  Dx: M35.361 B/B

## 2019-08-12 ENCOUNTER — Encounter: Payer: Self-pay | Admitting: *Deleted

## 2019-08-12 ENCOUNTER — Telehealth: Payer: Self-pay

## 2019-08-12 ENCOUNTER — Other Ambulatory Visit: Payer: Self-pay | Admitting: Neurology

## 2019-08-12 MED ORDER — HYDROCODONE-ACETAMINOPHEN 5-325 MG PO TABS: 1.0000 | ORAL_TABLET | Freq: Four times a day (QID) | ORAL | 0 refills | Status: DC | PRN

## 2019-08-12 NOTE — Telephone Encounter (Signed)
I called [facility] and canceled the Vicodin 5-300 mg tablet prescription. Spoke with Cherlynn Kaiser. Sent pt a Pharmacist, community message with update.

## 2019-08-12 NOTE — Telephone Encounter (Signed)
Patient called and stated that she is having a problem getting the Vicodin filled with her pharmacy and would like to discuss this with the nurse. Please call and advise.

## 2019-08-12 NOTE — Telephone Encounter (Signed)
done

## 2019-08-12 NOTE — Telephone Encounter (Signed)
Spoke with [employer] @ [facility]. She stated insurance will not pay for the 5-300 tablet but will probably cover the 5-325. I will send to Dr. Jaynee Eagles to see if the change can be made.

## 2019-08-24 ENCOUNTER — Other Ambulatory Visit: Payer: Self-pay | Admitting: *Deleted

## 2019-08-24 DIAGNOSIS — G43411 Hemiplegic migraine, intractable, with status migrainosus: Secondary | ICD-10-CM

## 2019-08-24 DIAGNOSIS — G43711 Chronic migraine without aura, intractable, with status migrainosus: Secondary | ICD-10-CM

## 2019-08-24 MED ORDER — TROKENDI XR 100 MG PO CP24: 100.0000 mg | ORAL_CAPSULE | Freq: Every day | ORAL | 3 refills | Status: DC

## 2019-08-29 ENCOUNTER — Other Ambulatory Visit: Payer: Self-pay | Admitting: Osteopathic Medicine

## 2019-08-29 DIAGNOSIS — G43409 Hemiplegic migraine, not intractable, without status migrainosus: Secondary | ICD-10-CM

## 2019-09-02 ENCOUNTER — Encounter: Payer: Self-pay | Admitting: Osteopathic Medicine

## 2019-09-03 MED ORDER — "NEEDLE (DISP) 23G X 1"" MISC": 0 refills | Status: DC

## 2019-09-03 MED ORDER — BD SYRINGE LUER-LOK 3 ML MISC: 0 refills | Status: DC

## 2019-09-03 NOTE — Telephone Encounter (Signed)
Spoke with Dr. Jaynee Eagles and she provided v.o. for the syringes for the Toradol.   four 3 mL syringes and four 23 gauge 1 inch needles

## 2019-09-13 ENCOUNTER — Encounter: Payer: Self-pay | Admitting: Osteopathic Medicine

## 2019-09-14 MED ORDER — FLUOXETINE HCL 20 MG PO CAPS: 20.0000 mg | ORAL_CAPSULE | Freq: Every day | ORAL | 0 refills | Status: DC

## 2019-09-22 ENCOUNTER — Ambulatory Visit (INDEPENDENT_AMBULATORY_CARE_PROVIDER_SITE_OTHER): Payer: Medicare Other | Admitting: Osteopathic Medicine

## 2019-09-22 ENCOUNTER — Encounter: Payer: Self-pay | Admitting: Osteopathic Medicine

## 2019-09-22 VITALS — BP 118/72 | HR 105 | Temp 99.0°F | Wt 123.0 lb

## 2019-09-22 DIAGNOSIS — M797 Fibromyalgia: Secondary | ICD-10-CM

## 2019-09-22 DIAGNOSIS — G43711 Chronic migraine without aura, intractable, with status migrainosus: Secondary | ICD-10-CM

## 2019-09-22 DIAGNOSIS — F339 Major depressive disorder, recurrent, unspecified: Secondary | ICD-10-CM

## 2019-09-22 DIAGNOSIS — N946 Dysmenorrhea, unspecified: Secondary | ICD-10-CM | POA: Diagnosis not present

## 2019-09-22 DIAGNOSIS — N921 Excessive and frequent menstruation with irregular cycle: Secondary | ICD-10-CM

## 2019-09-22 DIAGNOSIS — R5382 Chronic fatigue, unspecified: Secondary | ICD-10-CM

## 2019-09-22 DIAGNOSIS — L609 Nail disorder, unspecified: Secondary | ICD-10-CM

## 2019-09-22 MED ORDER — FLUOXETINE HCL 10 MG PO CAPS: 10.0000 mg | ORAL_CAPSULE | Freq: Every day | ORAL | 1 refills | Status: DC

## 2019-09-22 NOTE — Progress Notes (Signed)
Virtual Visit via Video (App used: Doximity) Note  I connected with      Holly [name] on 09/22/19 at 2:00 PM by a telemedicine application and verified that I am speaking with the correct person using two identifiers.  Patient is at home I am working from home    I discussed the limitations of evaluation and management by telemedicine and the availability of in person appointments. The patient expressed understanding and agreed to proceed.  History of Present Illness: Holly [name] is a 43 y.o. female who would like to discuss moods/depression  Holly [name] is really going through it, unfortunately. Her son has metastatic thyroid cancer and is undergoing chemotherapy. She notes that the prozac is working much better than the zoloft was, but she might need to increase her dose.   Also having issues with irregular, very heavy periods with severe cramping. Following with OBGYN but hasn't been seen since earlier this year, pre-COVID.       Observations/Objective: BP 118/72 (BP Location: Left Arm, Patient Position: Sitting, Cuff Size: Normal)   Pulse (!) 105   Temp 99 F (37.2 C) (Oral)   Wt 123 lb (55.8 kg)   SpO2 98%   BMI 19.26 kg/m  BP Readings from Last 3 Encounters:  09/22/19 118/72  09/22/19 118/72  06/12/19 105/70   Exam: Normal Speech.  NAD  Lab and Radiology Results No results found for this or any previous visit (from the past 72 hour(s)). No results found.     Assessment and Plan: 43 y.o. female with The primary encounter diagnosis was Depression, recurrent (HCC). Diagnoses of Dysmenorrhea, Menometrorrhagia, Intractable chronic migraine without aura and with status migrainosus, Fibromyalgia syndrome, Nail abnormalities, and Chronic fatigue were also pertinent to this visit.   PDMP not reviewed this encounter. Orders Placed This Encounter  Procedures  . CBC  . COMPLETE METABOLIC PANEL WITH GFR  . TSH  . LIPID SCREENING  . VITAMIN D 25  .  Vitamin B12  . Fe+TIBC+Fer   Meds ordered this encounter  Medications  . FLUoxetine (PROZAC) 10 MG capsule    Sig: Take 1 capsule (10 mg total) by mouth daily. Take with 20 mg capsules for total daily dose 30 mg    Dispense:  30 capsule    Refill:  1   Patient Instructions  Let's try increasing the Prozac from 20 mg to 30 mg, add the 20 + 10 mg pills together for now. We can go up to 40 mg (20+10+10 pills, or 20+20 pills) if needed if you feel it's not helping after a couple weeks, though full effect of a dose change may take 4-6 weeks total.   I'd contact your OBGYN regarding the bleeding issues. Severe symptoms warrant a discussion about possible treatment options (medications, ablation, hysterectomy or others per OBGYN recommendations).   Let's get some labs! It's been awhile.    Instructions sent via MyChart. If MyChart not available, pt was given option for info via personal e-mail w/ no guarantee of protected health info over unsecured e-mail communication, and MyChart sign-up instructions were included.   Follow Up Instructions: Return in about 4 weeks (around 10/20/2019) for RECHECK ON NEW MEDICATION (INCREASED DOSE PROZAC) OR SEE ME SOONER IF NEEDED .    I discussed the assessment and treatment plan with the patient. The patient was provided an opportunity to ask questions and all were answered. The patient agreed with the plan and demonstrated an understanding of the instructions.  The patient was advised to call back or seek an in-person evaluation if any new concerns, if symptoms worsen or if the condition fails to improve as anticipated.  25 minutes of non-face-to-face time was provided during this encounter.                      Historical information moved to improve visibility of documentation.  Past Medical History:  Diagnosis Date  . Depression   . Fibromyalgia 2018  . GERD (gastroesophageal reflux disease)   . Hemiplegia ([place])   . IBS  (irritable bowel syndrome)   . Migraine   . MRSA (methicillin resistant staph aureus) culture positive   . Vaginal Pap smear, abnormal    Past Surgical History:  Procedure Laterality Date  . WRIST SURGERY     cyst removal   Social History   Tobacco Use  . Smoking status: Never Smoker  . Smokeless tobacco: Never Used  Substance Use Topics  . Alcohol use: No    Alcohol/week: 0.0 standard drinks    Comment: none   family history includes Autoimmune disease in her father; Cancer in her sister; Crohn's disease in her father; Depression in her mother and sister; Diabetes in her father, paternal grandfather, and sister; Factor V Leiden deficiency in her father; Heart attack in her father and mother; Heart failure in her father; Hyperlipidemia in her father and mother; Hypertension in her mother; Migraines in her mother; Scoliosis in her mother; Thyroid disease in her mother.  Medications: Current Outpatient Medications  Medication Sig Dispense Refill  . butalbital-acetaminophen-caffeine (FIORICET) 50-325-40 MG tablet Take 1-2 tablets at onset of migraine every 6 hours as needed. 15 tablet 4  . FLUoxetine (PROZAC) 20 MG capsule Take 1 capsule (20 mg total) by mouth daily. 90 capsule 0  . fluticasone (FLONASE) 50 MCG/ACT nasal spray Place 2 sprays into both nostrils daily. 16 g 1  . Galcanezumab-gnlm (EMGALITY) 120 MG/ML SOAJ Inject 120 mg into the skin every 30 (thirty) days. 1 pen 11  . HYDROcodone-acetaminophen (NORCO/VICODIN) 5-325 MG tablet Take 1 tablet by mouth every 6 (six) hours as needed for moderate pain. 28 tablet 0  . ibuprofen (ADVIL,MOTRIN) 800 MG tablet Take 1 tablet (800 mg total) by mouth every 8 (eight) hours as needed. 60 tablet 2  . montelukast (SINGULAIR) 10 MG tablet Take 1 tablet (10 mg) by mouth daily in the evening 90 tablet 3  . NEEDLE, DISP, 23 G 23G X 1" MISC Attach to syringe to draw up and administer Toradol into the muscle. 4 each 0  . norethindrone (MICRONOR)  0.35 MG tablet Take 1 tablet (0.35 mg total) by mouth daily. 84 tablet 3  . omeprazole (PRILOSEC) 40 MG capsule TAKE ONE CAPSULE DAILY 90 capsule 2  . ondansetron (ZOFRAN-ODT) 4 MG disintegrating tablet Take 1 tablet (4 mg total) by mouth every 8 (eight) hours as needed for nausea. 30 tablet 11  . prochlorperazine (COMPAZINE) 10 MG tablet Take 1 tablet (10 mg total) by mouth every 6 (six) hours as needed. 30 tablet 11  . promethazine (PHENERGAN) 25 MG suppository Place 1 suppository (25 mg total) rectally every 6 (six) hours as needed for nausea or vomiting. 12 each 6  . QC LORATADINE-D 10-240 MG 24 hr tablet TAKE ONE TABLET DAILY 30 tablet 11  . Syringe, Disposable, (B-D SYRINGE LUER-LOK 3CC) 3 ML MISC Attach to needle to draw up and administer Toradol into the muscle. 4 each 0  . Topiramate ER (  TROKENDI XR) 100 MG CP24 Take 100 mg by mouth at bedtime. 30 capsule 3  . triamcinolone cream (KENALOG) 0.1 % Apply 1 application topically 2 (two) times daily. To affected area(s) as needed 45 g 1  . FLUoxetine (PROZAC) 10 MG capsule Take 1 capsule (10 mg total) by mouth daily. Take with 20 mg capsules for total daily dose 30 mg 30 capsule 1  . Rimegepant Sulfate (NURTEC) 75 MG TBDP Take 75 mg by mouth daily as needed. (Patient not taking: Reported on 09/22/2019) 2 tablet 0   No current facility-administered medications for this visit.    Allergies  Allergen Reactions  . Other Rash    Overly sleepy  . Terbutaline Palpitations  . Sumatriptan   . Adhesive [Tape] Itching and Rash  . Aimovig [Erenumab-Aooe] Rash  . Celebrex [Celecoxib] Rash    03/17/18 patient is unsure, thinks it may have been shaving cream  . Cetirizine Hcl Other (See Comments)    Overly sleepy   . Ciprofloxacin Rash    PDMP not reviewed this encounter. Orders Placed This Encounter  Procedures  . CBC  . COMPLETE METABOLIC PANEL WITH GFR  . TSH  . LIPID SCREENING  . VITAMIN D 25  . Vitamin B12  . Fe+TIBC+Fer   Meds  ordered this encounter  Medications  . FLUoxetine (PROZAC) 10 MG capsule    Sig: Take 1 capsule (10 mg total) by mouth daily. Take with 20 mg capsules for total daily dose 30 mg    Dispense:  30 capsule    Refill:  1

## 2019-09-22 NOTE — Progress Notes (Signed)
See other note from 09/22/19

## 2019-09-23 ENCOUNTER — Ambulatory Visit: Payer: Medicare Other | Admitting: Osteopathic Medicine

## 2019-09-23 ENCOUNTER — Encounter: Payer: Self-pay | Admitting: Osteopathic Medicine

## 2019-09-23 NOTE — Patient Instructions (Addendum)
Let's try increasing the Prozac from 20 mg to 30 mg, add the 20 + 10 mg pills together for now. We can go up to 40 mg (20+10+10 pills, or 20+20 pills) if needed if you feel it's not helping after a couple weeks, though full effect of a dose change may take 4-6 weeks total.   I'd contact your OBGYN regarding the bleeding issues. Severe symptoms warrant a discussion about possible treatment options (medications, ablation, hysterectomy or others per OBGYN recommendations).   Let's get some labs! It's been awhile.

## 2019-10-02 DIAGNOSIS — N946 Dysmenorrhea, unspecified: Secondary | ICD-10-CM | POA: Diagnosis not present

## 2019-10-02 DIAGNOSIS — E786 Lipoprotein deficiency: Secondary | ICD-10-CM | POA: Diagnosis not present

## 2019-10-02 DIAGNOSIS — M797 Fibromyalgia: Secondary | ICD-10-CM | POA: Diagnosis not present

## 2019-10-02 DIAGNOSIS — R5382 Chronic fatigue, unspecified: Secondary | ICD-10-CM | POA: Diagnosis not present

## 2019-10-02 DIAGNOSIS — E559 Vitamin D deficiency, unspecified: Secondary | ICD-10-CM | POA: Diagnosis not present

## 2019-10-02 DIAGNOSIS — Z79899 Other long term (current) drug therapy: Secondary | ICD-10-CM | POA: Diagnosis not present

## 2019-10-03 LAB — COMPLETE METABOLIC PANEL WITH GFR
AG Ratio: 1.7 (calc) (ref 1.0–2.5)
ALT: 5 U/L — ABNORMAL LOW (ref 6–29)
AST: 10 U/L (ref 10–30)
Albumin: 4.2 g/dL (ref 3.6–5.1)
Alkaline phosphatase (APISO): 48 U/L (ref 31–125)
BUN: 7 mg/dL (ref 7–25)
CO2: 25 mmol/L (ref 20–32)
Calcium: 8.5 mg/dL — ABNORMAL LOW (ref 8.6–10.2)
Chloride: 109 mmol/L (ref 98–110)
Creat: 0.97 mg/dL (ref 0.50–1.10)
GFR, Est African American: 84 mL/min/{1.73_m2} (ref >=60)
GFR, Est Non African American: 73 mL/min/{1.73_m2} (ref >=60)
Globulin: 2.5 g/dL (calc) (ref 1.9–3.7)
Glucose, Bld: 93 mg/dL (ref 65–99)
Potassium: 3.9 mmol/L (ref 3.5–5.3)
Sodium: 140 mmol/L (ref 135–146)
Total Bilirubin: 0.3 mg/dL (ref 0.2–1.2)
Total Protein: 6.7 g/dL (ref 6.1–8.1)

## 2019-10-03 LAB — CBC
HCT: 34 % — ABNORMAL LOW (ref 35.0–45.0)
Hemoglobin: 11.7 g/dL (ref 11.7–15.5)
MCH: 31.2 pg (ref 27.0–33.0)
MCHC: 34.4 g/dL (ref 32.0–36.0)
MCV: 90.7 fL (ref 80.0–100.0)
MPV: 8.7 fL (ref 7.5–12.5)
Platelets: 208 10*3/uL (ref 140–400)
RBC: 3.75 10*6/uL — ABNORMAL LOW (ref 3.80–5.10)
RDW: 12.4 % (ref 11.0–15.0)
WBC: 5.8 10*3/uL (ref 3.8–10.8)

## 2019-10-03 LAB — LIPID PANEL
Cholesterol: 114 mg/dL (ref <200)
HDL: 42 mg/dL — ABNORMAL LOW (ref >=50)
LDL Cholesterol (Calc): 59 mg/dL (calc)
Non-HDL Cholesterol (Calc): 72 mg/dL (calc) (ref <130)
Total CHOL/HDL Ratio: 2.7 (calc) (ref <5.0)
Triglycerides: 54 mg/dL (ref <150)

## 2019-10-03 LAB — TSH: TSH: 1.27 mIU/L

## 2019-10-03 LAB — IRON,TIBC AND FERRITIN PANEL
%SAT: 30 % (calc) (ref 16–45)
Ferritin: 17 ng/mL (ref 16–232)
Iron: 65 ug/dL (ref 40–190)
TIBC: 215 mcg/dL (calc) — ABNORMAL LOW (ref 250–450)

## 2019-10-03 LAB — VITAMIN D 25 HYDROXY (VIT D DEFICIENCY, FRACTURES): Vit D, 25-Hydroxy: 12 ng/mL — ABNORMAL LOW (ref 30–100)

## 2019-10-03 LAB — VITAMIN B12: Vitamin B-12: 443 pg/mL (ref 200–1100)

## 2019-10-05 MED ORDER — VITAMIN D (ERGOCALCIFEROL) 1.25 MG (50000 UNIT) PO CAPS: 50000.0000 [IU] | ORAL_CAPSULE | ORAL | 0 refills | Status: DC

## 2019-10-05 NOTE — Addendum Note (Signed)
Addended by: Maryla Morrow on: 10/05/2019 07:48 AM   Modules accepted: Orders

## 2019-10-12 ENCOUNTER — Telehealth: Payer: Self-pay

## 2019-10-12 ENCOUNTER — Ambulatory Visit: Payer: Medicare Other | Admitting: Obstetrics & Gynecology

## 2019-10-12 NOTE — Telephone Encounter (Signed)
Attempted to call pt to let her know that Dr.Leggett is not in the office today and that Dr.Anyanwu is covering today. I requested pt call the office to let us know if she wants to keep her appt today or reschedule. VM left.

## 2019-10-26 ENCOUNTER — Other Ambulatory Visit: Payer: Self-pay | Admitting: Neurology

## 2019-11-05 ENCOUNTER — Other Ambulatory Visit: Payer: Self-pay | Admitting: Osteopathic Medicine

## 2019-11-05 DIAGNOSIS — J302 Other seasonal allergic rhinitis: Secondary | ICD-10-CM

## 2019-11-05 IMAGING — DX DG CHEST 2V
2 series · 2 of 2 positions shown · non-contrast
Comparison: No recent prior.

CLINICAL DATA: Right lower rib pain. Pain under breast for 1-2
weeks. No injury.

EXAM:
CHEST - 2 VIEW

[chest pa]
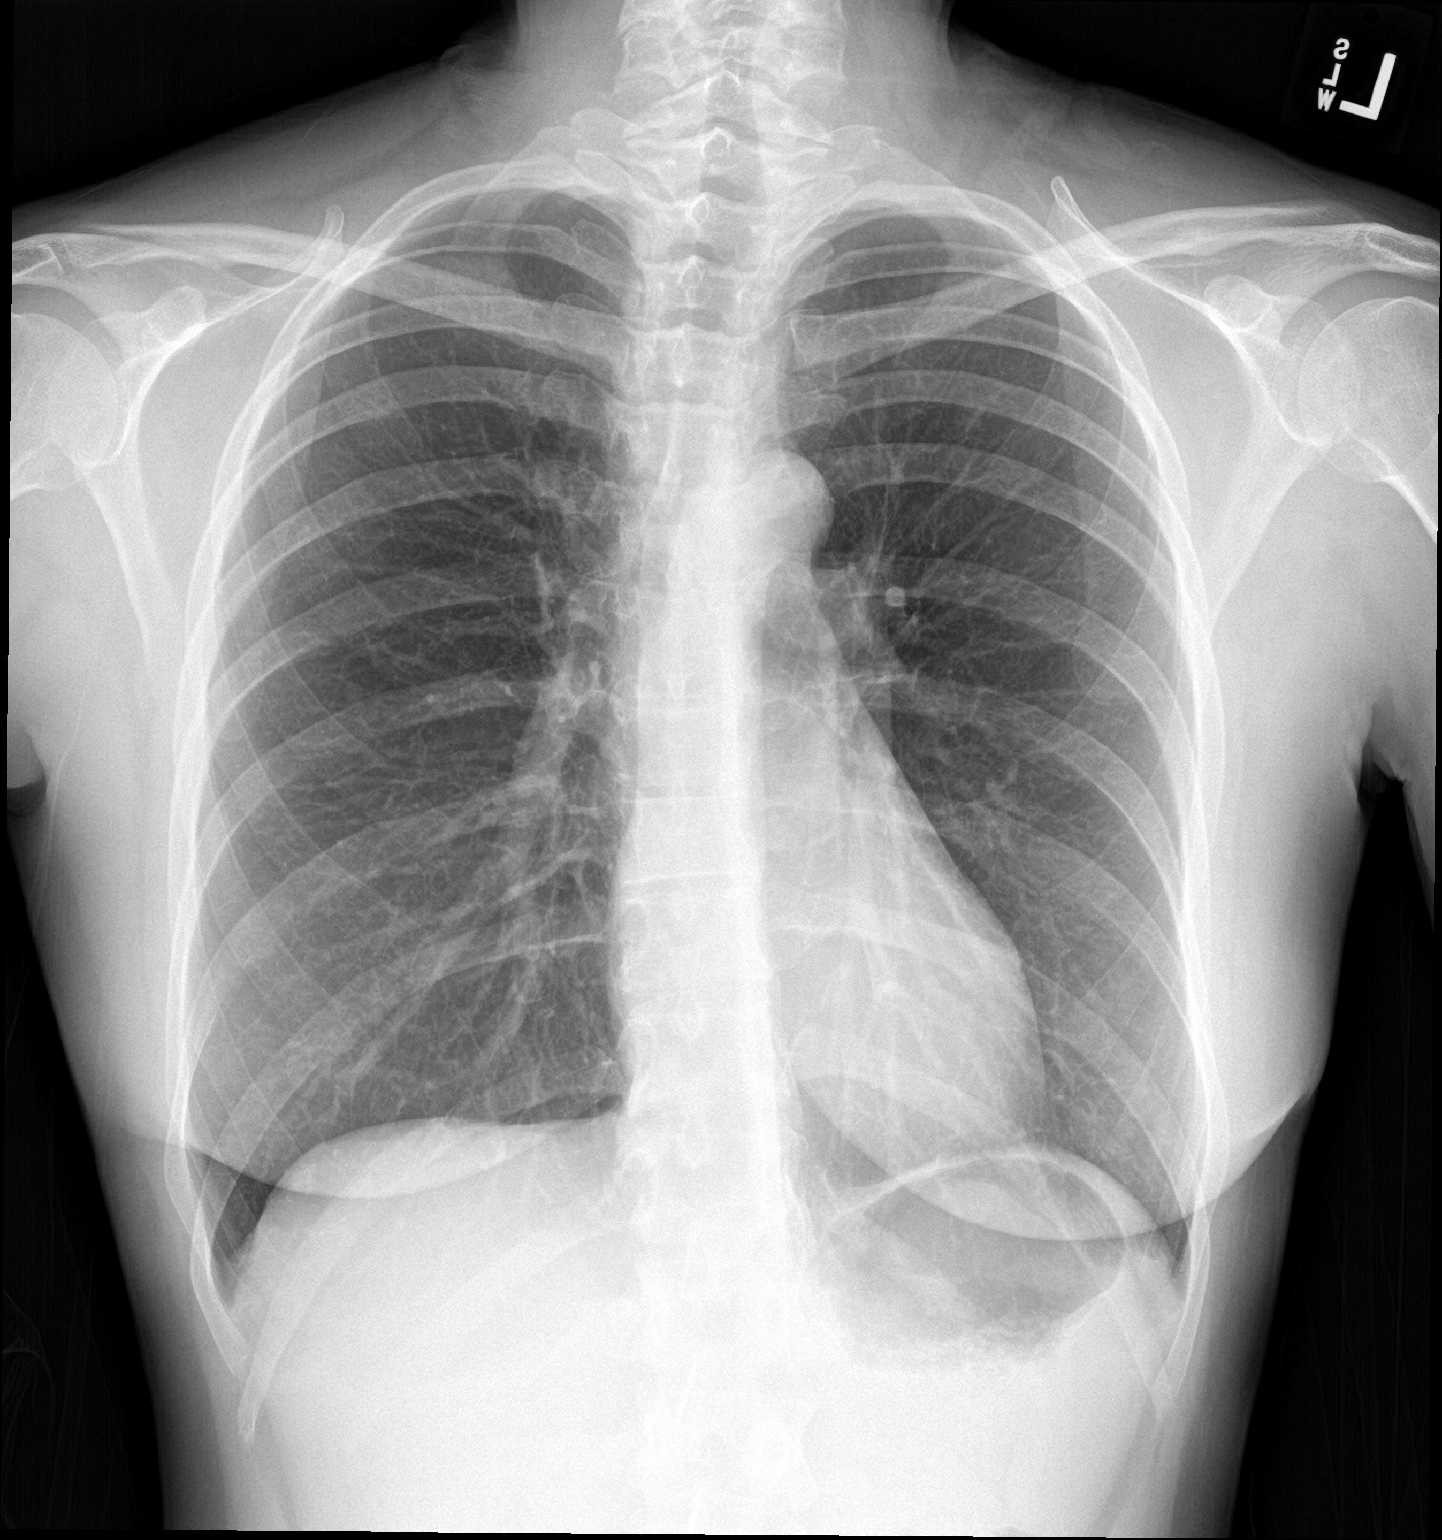

[chest lat]
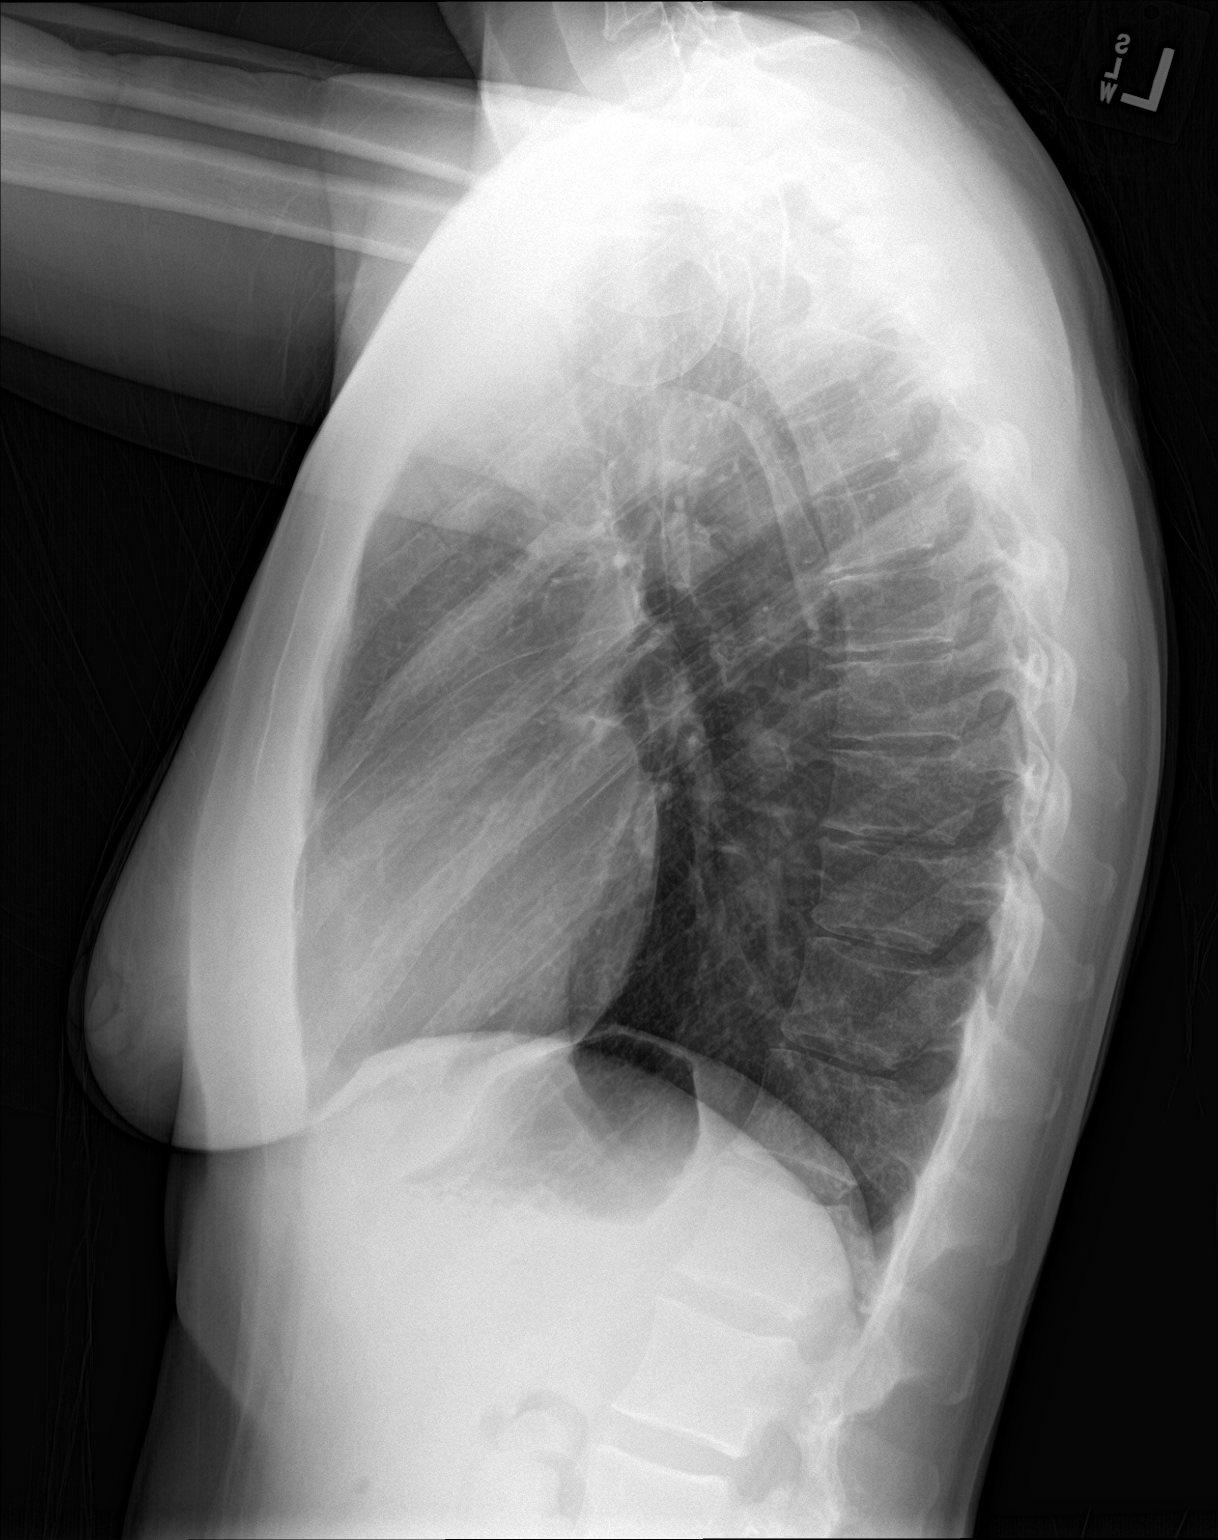

[2 of 2 positions shown; findings below may reference images not displayed]

FINDINGS: Mediastinum and hilar structures normal. Lungs are clear. No pleural
effusion or pneumothorax. No acute bony abnormality.
IMPRESSION: No acute cardiopulmonary disease.

## 2019-11-05 NOTE — Telephone Encounter (Signed)
Requested medication (s) are due for refill today: yes  Requested medication (s) are on the active medication list: yes  Last refill:  10/16/2019  Future visit scheduled: no  Notes to clinic: refill cannot be delegated    Requested Prescriptions  Pending Prescriptions Disp Refills   ALLERGY RELIEF/NASAL DECONGEST 10-240 MG 24 hr tablet [Pharmacy Med Name: Allergy Relief and Nasal Decongestant 10 mg-240 mg tablet,extended rel] 30 tablet 11    Sig: TAKE ONE TABLET BY MOUTH DAILY      Not Delegated - Ear, Nose, and Throat:  Combination Products with Pseudoephedrine Failed - 11/05/2019  8:00 AM      Failed - This refill cannot be delegated      Passed - Last BP in normal range    BP Readings from Last 1 Encounters:  09/22/19 118/72          Passed - Valid encounter within last 12 months    Recent Outpatient Visits           1 month ago Depression, recurrent ([place])   [place] [facility], Lanelle Bal, DO   1 month ago Depression, recurrent [facility])   [place] [facility], Lanelle Bal, DO   4 months ago Moderate episode of recurrent major depressive disorder [facility])   [place] [facility], Lanelle Bal, DO   4 months ago Right ear pain   [place] [facility], Royetta Car, PA-C   10 months ago Palpable lymph node   [facility], [place], DO

## 2019-11-11 ENCOUNTER — Other Ambulatory Visit: Payer: Self-pay | Admitting: Osteopathic Medicine

## 2019-11-11 NOTE — Telephone Encounter (Signed)
Requested medication (s) are due for refill today: yes  Requested medication (s) are on the active medication list: yes  Last refill:  10/19/2019  Future visit scheduled: no  Notes to clinic:  review for refill   Requested Prescriptions  Pending Prescriptions Disp Refills   FLUoxetine (PROZAC) 10 MG capsule [Pharmacy Med Name: fluoxetine 10 mg capsule] 30 capsule 1    Sig: Take 1 capsule (10 mg total) by mouth daily. Take with 20 mg capsules for total daily dose 30 mg      Psychiatry:  Antidepressants - SSRI Failed - 11/11/2019  8:00 AM      Failed - Valid encounter within last 6 months    Recent Outpatient Visits           1 month ago Depression, recurrent [facility])   [place] [facility], Lanelle Bal, DO   1 month ago Depression, recurrent [facility])   [place] [facility], Lanelle Bal, DO   5 months ago Moderate episode of recurrent major depressive disorder [facility])   [place] [facility], Lanelle Bal, DO   5 months ago Right ear pain   [place] [facility], Royetta Car, PA-C   11 months ago Palpable lymph node   [facility], [place], DO              Passed - Completed PHQ-2 or PHQ-9 in the last 360 days.

## 2019-11-17 ENCOUNTER — Other Ambulatory Visit: Payer: Self-pay

## 2019-11-17 ENCOUNTER — Ambulatory Visit (INDEPENDENT_AMBULATORY_CARE_PROVIDER_SITE_OTHER): Payer: Medicare Other | Admitting: Neurology

## 2019-11-17 VITALS — Temp 97.8°F

## 2019-11-17 DIAGNOSIS — G43711 Chronic migraine without aura, intractable, with status migrainosus: Secondary | ICD-10-CM | POA: Diagnosis not present

## 2019-11-17 NOTE — Progress Notes (Signed)
Botox- 200 units x 1 vial Lot: [id number]  Expiration: 07/2022 NDC: 7505-1833-58  Bacteriostatic 0.9% Sodium Chloride- 59mL total Lot: [id number] Expiration: 07/2022 NDC: 4210-3128-11  Dx: W86.773 B/B

## 2019-11-17 NOTE — Progress Notes (Signed)
Consent Form Botulism Toxin Injection For Chronic Migraine 11/17/2019: Still doing very well, > 50% improvement in frequency of migraines.  Ubrelvy caused stomach discomfort. Lasmiditan did not work. Try Nurtec. Continue Fioricet, only acute med that works. Did extra in the cervical paraspinals, also across the occipitalis in the back as well "around the ponytail"   Reviewed orally with patient, additionally signature is on file:Consent Form Botulism Toxin Injection For Chronic Migraine    Reviewed orally with patient, additionally signature is on file:  Botulism toxin has been approved by the Federal drug administration for treatment of chronic migraine. Botulism toxin does not cure chronic migraine and it may not be effective in some patients.  The administration of botulism toxin is accomplished by injecting a small amount of toxin into the muscles of the neck and head. Dosage must be titrated for each individual. Any benefits resulting from botulism toxin tend to wear off after 3 months with a repeat injection required if benefit is to be maintained. Injections are usually done every 3-4 months with maximum effect peak achieved by about 2 or 3 weeks. Botulism toxin is expensive and you should be sure of what costs you will incur resulting from the injection.  The side effects of botulism toxin use for chronic migraine may include:   -Transient, and usually mild, facial weakness with facial injections  -Transient, and usually mild, head or neck weakness with head/neck injections  -Reduction or loss of forehead facial animation due to forehead muscle weakness  -Eyelid drooping  -Dry eye  -Pain at the site of injection or bruising at the site of injection  -Double vision  -Potential unknown long term risks  Contraindications: You should not have Botox if you are pregnant, nursing, allergic to albumin, have an infection, skin condition, or muscle weakness at the site of the injection,  or have myasthenia gravis, Lambert-Eaton syndrome, or ALS.  It is also possible that as with any injection, there may be an allergic reaction or no effect from the medication. Reduced effectiveness after repeated injections is sometimes seen and rarely infection at the injection site may occur. All care will be taken to prevent these side effects. If therapy is given over a long time, atrophy and wasting in the muscle injected may occur. Occasionally the patient's become refractory to treatment because they develop antibodies to the toxin. In this event, therapy needs to be modified.  I have read the above information and consent to the administration of botulism toxin.    BOTOX PROCEDURE NOTE FOR MIGRAINE HEADACHE    Contraindications and precautions discussed with patient(above). Aseptic procedure was observed and patient tolerated procedure. Procedure performed by Dr. Artemio Aly  The condition has existed for more than 6 months, and pt does not have a diagnosis of ALS, Myasthenia Gravis or Lambert-Eaton Syndrome.  Risks and benefits of injections discussed and pt agrees to proceed with the procedure.  Written consent obtained  These injections are medically necessary. Pt  receives good benefits from these injections. These injections do not cause sedations or hallucinations which the oral therapies may cause.  Description of procedure:  The patient was placed in a sitting position. The standard protocol was used for Botox as follows, with 5 units of Botox injected at each site:   -Procerus muscle, midline injection  -Corrugator muscle, bilateral injection  -Frontalis muscle, bilateral injection, with 2 sites each side, medial injection was performed in the upper one third of the frontalis muscle, in the region  vertical from the medial inferior edge of the superior orbital rim. The lateral injection was again in the upper one third of the forehead vertically above the lateral limbus of  the cornea, 1.5 cm lateral to the medial injection site.  -Temporalis muscle injection, 4 sites, bilaterally. The first injection was 3 cm above the tragus of the ear, second injection site was 1.5 cm to 3 cm up from the first injection site in line with the tragus of the ear. The third injection site was 1.5-3 cm forward between the first 2 injection sites. The fourth injection site was 1.5 cm posterior to the second injection site.   -Occipitalis muscle injection, 3 sites, bilaterally. The first injection was done one half way between the occipital protuberance and the tip of the mastoid process behind the ear. The second injection site was done lateral and superior to the first, 1 fingerbreadth from the first injection. The third injection site was 1 fingerbreadth superiorly and medially from the first injection site.  -Cervical paraspinal muscle injection, 2 sites, bilateral knee first injection site was 1 cm from the midline of the cervical spine, 3 cm inferior to the lower border of the occipital protuberance. The second injection site was 1.5 cm superiorly and laterally to the first injection site.  -Trapezius muscle injection was performed at 3 sites, bilaterally. The first injection site was in the upper trapezius muscle halfway between the inflection point of the neck, and the acromion. The second injection site was one half way between the acromion and the first injection site. The third injection was done between the first injection site and the inflection point of the neck.   Will return for repeat injection in 3 months.   200 units of Botox was used, any Botox not injected was wasted. The patient tolerated the procedure well, there were no complications of the above procedure.

## 2019-11-18 ENCOUNTER — Encounter: Payer: Self-pay | Admitting: Osteopathic Medicine

## 2019-11-18 ENCOUNTER — Other Ambulatory Visit: Payer: Self-pay | Admitting: Neurology

## 2019-11-18 ENCOUNTER — Ambulatory Visit (INDEPENDENT_AMBULATORY_CARE_PROVIDER_SITE_OTHER): Payer: Medicare Other | Admitting: Osteopathic Medicine

## 2019-11-18 VITALS — BP 117/72 | HR 73 | Temp 98.1°F | Wt 138.0 lb

## 2019-11-18 DIAGNOSIS — F339 Major depressive disorder, recurrent, unspecified: Secondary | ICD-10-CM

## 2019-11-18 MED ORDER — FLUOXETINE HCL 20 MG PO CAPS: 20.0000 mg | ORAL_CAPSULE | Freq: Every day | ORAL | 3 refills | Status: DC

## 2019-11-18 MED ORDER — IBUPROFEN 800 MG PO TABS: 800.0000 mg | ORAL_TABLET | Freq: Three times a day (TID) | ORAL | 1 refills | Status: DC | PRN

## 2019-11-18 MED ORDER — FLUOXETINE HCL 10 MG PO CAPS: 10.0000 mg | ORAL_CAPSULE | Freq: Every day | ORAL | 3 refills | Status: DC

## 2019-11-18 MED ORDER — OMEPRAZOLE 40 MG PO CPDR: 40.0000 mg | DELAYED_RELEASE_CAPSULE | Freq: Every day | ORAL | 3 refills | Status: DC

## 2019-11-18 NOTE — Progress Notes (Signed)
Virtual Visit via Video (App used: Doximity) Note  I connected with      Holly [name] on 11/18/19 at 10:27 AM  by a telemedicine application and verified that I am speaking with the correct person using two identifiers.  Patient is at home I am in office   I discussed the limitations of evaluation and management by telemedicine and the availability of in person appointments. The patient expressed understanding and agreed to proceed.  History of Present Illness: Holly [name] is a 43 y.o. female who would like to discuss mental health    Holly [name] is really going through it, unfortunately. Her son has metastatic thyroid cancer and is undergoing chemotherapy. She notes that the prozac is working much better than the zoloft was, last visit about 1.5 mos ago we increased it from 20 mg to 30 mg w/option to go up to 40 mg. Pt went to 30 and di ok, went u to 40 for a few days but didn't think would be helpful given everythign external goin gon. Feels ok on the 30 mg and would like to continue   Also having issues with irregular, very heavy periods with severe cramping. Following with OBGYN but needs to schedule appt. Dr Penne LashLeggett not inoffice, pt will wait for her.   [facility] 11/18/2019 09/22/2019 02/24/2019  Decreased Interest 1 0 0  Down, Depressed, Hopeless 0 0 0  PHQ - 2 Score 1 0 0  Altered sleeping 2 - -  Tired, decreased energy 2 - -  Change in appetite 0 - -  Feeling bad or failure about yourself  0 - -  Trouble concentrating 0 - -  Moving slowly or fidgety/restless 1 - -  Suicidal thoughts 0 - -  PHQ-9 Score 6 - -  Difficult doing work/chores Somewhat difficult - -   GAD 7 : Generalized Anxiety Score 11/18/2019 09/22/2019 12/12/2018 08/06/2018  Nervous, Anxious, on Edge 0 0 0 1  Control/stop worrying 0 0 1 1  Worry too much - different things 0 0 1 2  Trouble relaxing 2 1 2 2   Restless 0 0 1 1  Easily annoyed or irritable 2 1 2 1   Afraid - awful might  happen 0 0 0 1  Total GAD 7 Score 4 2 7 9   Anxiety Difficulty Somewhat difficult Somewhat difficult Somewhat difficult -       Observations/Objective: BP 117/72   Pulse 73   Temp 98.1 F (36.7 C)   Wt 138 lb (62.6 kg)   LMP 10/23/2019   SpO2 100%   BMI 21.61 kg/m  BP Readings from Last 3 Encounters:  11/18/19 117/72  09/22/19 118/72  09/22/19 118/72   Exam: Normal Speech.  NAD  Lab and Radiology Results No results found for this or any previous visit (from the past 72 hour(s)). No results found.     Assessment and Plan: 43 y.o. female with The encounter diagnosis was Depression, recurrent (HCC).   PDMP not reviewed this encounter. No orders of the defined types were placed in this encounter.  Meds ordered this encounter  Medications  . FLUoxetine (PROZAC) 10 MG capsule    Sig: Take 1 capsule (10 mg total) by mouth daily. Take with 20 mg capsules for total daily dose 30 mg    Dispense:  90 capsule    Refill:  3  . FLUoxetine (PROZAC) 20 MG capsule    Sig: Take 1 capsule (20 mg total) by mouth  daily.    Dispense:  90 capsule    Refill:  3  . ibuprofen (ADVIL) 800 MG tablet    Sig: Take 1 tablet (800 mg total) by mouth every 8 (eight) hours as needed.    Dispense:  90 tablet    Refill:  1  . omeprazole (PRILOSEC) 40 MG capsule    Sig: Take 1 capsule (40 mg total) by mouth daily.    Dispense:  90 capsule    Refill:  3     Follow Up Instructions: Return in about 6 months (around 05/18/2020) for ANNUAL (call week prior to visit for lab orders).    I discussed the assessment and treatment plan with the patient. The patient was provided an opportunity to ask questions and all were answered. The patient agreed with the plan and demonstrated an understanding of the instructions.   The patient was advised to call back or seek an in-person evaluation if any new concerns, if symptoms worsen or if the condition fails to improve as anticipated.  15 minutes of  non-face-to-face time was provided during this encounter.      . . . . . . . . . . . . . Marland Kitchen                   Historical information moved to improve visibility of documentation.  Past Medical History:  Diagnosis Date  . Depression   . Fibromyalgia 2018  . GERD (gastroesophageal reflux disease)   . Hemiplegia (HCC)   . IBS (irritable bowel syndrome)   . Migraine   . MRSA (methicillin resistant staph aureus) culture positive   . Vaginal Pap smear, abnormal    Past Surgical History:  Procedure Laterality Date  . WRIST SURGERY     cyst removal   Social History   Tobacco Use  . Smoking status: Never Smoker  . Smokeless tobacco: Never Used  Substance Use Topics  . Alcohol use: No    Alcohol/week: 0.0 standard drinks    Comment: none   family history includes Autoimmune disease in her father; Cancer in her sister; Crohn's disease in her father; Depression in her mother and sister; Diabetes in her father, paternal grandfather, and sister; Factor V Leiden deficiency in her father; Heart attack in her father and mother; Heart failure in her father; Hyperlipidemia in her father and mother; Hypertension in her mother; Migraines in her mother; Scoliosis in her mother; Thyroid disease in her mother.  Medications: Current Outpatient Medications  Medication Sig Dispense Refill  . ALLERGY RELIEF/NASAL DECONGEST 10-240 MG 24 hr tablet TAKE ONE TABLET BY MOUTH DAILY 30 tablet 11  . butalbital-acetaminophen-caffeine (FIORICET) 50-325-40 MG tablet Take 1-2 tablets at onset of migraine every 6 hours as needed. 15 tablet 4  . FLUoxetine (PROZAC) 10 MG capsule Take 1 capsule (10 mg total) by mouth daily. Take with 20 mg capsules for total daily dose 30 mg 90 capsule 3  . FLUoxetine (PROZAC) 20 MG capsule Take 1 capsule (20 mg total) by mouth daily. 90 capsule 3  . fluticasone (FLONASE) 50 MCG/ACT nasal spray Place 2 sprays into both nostrils daily. 16 g 1  .  Galcanezumab-gnlm (EMGALITY) 120 MG/ML SOAJ Inject 120 mg into the skin every 30 (thirty) days. 1 pen 11  . HYDROcodone-acetaminophen (NORCO/VICODIN) 5-325 MG tablet Take 1 tablet by mouth every 6 (six) hours as needed for moderate pain. 28 tablet 0  . ibuprofen (ADVIL) 800 MG tablet Take 1 tablet (800 mg  total) by mouth every 8 (eight) hours as needed. 90 tablet 1  . ketorolac (TORADOL) 60 MG/2ML SOLN injection INJECT 60 MG(2 ML) INTO THE MUSCLE EVERY 6 HOURS AS NEEDED FOR MIGRAINE. MAXIMUM DAILY DOSE IS 120 MG. DO NOT USE MORE THAN 4 DAYS IN A MONTH. 8 mL 3  . montelukast (SINGULAIR) 10 MG tablet Take 1 tablet (10 mg) by mouth daily in the evening 90 tablet 3  . NEEDLE, DISP, 23 G 23G X 1" MISC Attach to syringe to draw up and administer Toradol into the muscle. 4 each 0  . norethindrone (MICRONOR) 0.35 MG tablet Take 1 tablet (0.35 mg total) by mouth daily. 84 tablet 3  . omeprazole (PRILOSEC) 40 MG capsule Take 1 capsule (40 mg total) by mouth daily. 90 capsule 3  . ondansetron (ZOFRAN-ODT) 4 MG disintegrating tablet Take 1 tablet (4 mg total) by mouth every 8 (eight) hours as needed for nausea. 30 tablet 11  . prochlorperazine (COMPAZINE) 10 MG tablet Take 1 tablet (10 mg total) by mouth every 6 (six) hours as needed. 30 tablet 11  . promethazine (PHENERGAN) 25 MG suppository Place 1 suppository (25 mg total) rectally every 6 (six) hours as needed for nausea or vomiting. 12 each 6  . Rimegepant Sulfate (NURTEC) 75 MG TBDP Take 75 mg by mouth daily as needed. 2 tablet 0  . Syringe, Disposable, (B-D SYRINGE LUER-LOK 3CC) 3 ML MISC Attach to needle to draw up and administer Toradol into the muscle. 4 each 0  . Topiramate ER (TROKENDI XR) 100 MG CP24 Take 100 mg by mouth at bedtime. 30 capsule 3  . triamcinolone cream (KENALOG) 0.1 % Apply 1 application topically 2 (two) times daily. To affected area(s) as needed 45 g 1  . Vitamin D, Ergocalciferol, (DRISDOL) 1.25 MG (50000 UT) CAPS capsule Take 1  capsule (50,000 Units total) by mouth every 7 (seven) days. Take for 12 total doses(weeks) than can transition to 2000 units OTC supplement daily 12 capsule 0   No current facility-administered medications for this visit.   Allergies  Allergen Reactions  . Other Rash    Overly sleepy  . Terbutaline Palpitations  . Sumatriptan   . Adhesive [Tape] Itching and Rash  . Aimovig [Erenumab-Aooe] Rash  . Celebrex [Celecoxib] Rash    03/17/18 patient is unsure, thinks it may have been shaving cream  . Cetirizine Hcl Other (See Comments)    Overly sleepy   . Ciprofloxacin Rash

## 2019-11-25 ENCOUNTER — Encounter: Payer: Self-pay | Admitting: *Deleted

## 2019-11-30 DIAGNOSIS — E786 Lipoprotein deficiency: Secondary | ICD-10-CM | POA: Insufficient documentation

## 2019-11-30 DIAGNOSIS — E559 Vitamin D deficiency, unspecified: Secondary | ICD-10-CM | POA: Insufficient documentation

## 2019-12-02 ENCOUNTER — Telehealth: Payer: Self-pay | Admitting: *Deleted

## 2019-12-02 NOTE — Telephone Encounter (Signed)
Per CMM: Request Reference Number: [id number]. EMGALITY INJ 120MG /ML is approved through 11/18/2020. For further questions, call [phone].

## 2019-12-02 NOTE — Telephone Encounter (Signed)
Completed Em[employer] renewal PA on CMM. Per previous notes, positive response with [employer]. Key: [id number]. Awaiting [facility] determination.

## 2019-12-03 NOTE — Telephone Encounter (Signed)
We received the approval notice via fax from [facility]. I faxed this to the pt's pharmacy as FYI. Received a receipt of confirmation.

## 2019-12-11 ENCOUNTER — Other Ambulatory Visit: Payer: Self-pay | Admitting: Neurology

## 2019-12-17 ENCOUNTER — Other Ambulatory Visit: Payer: Self-pay | Admitting: Neurology

## 2019-12-17 DIAGNOSIS — G43711 Chronic migraine without aura, intractable, with status migrainosus: Secondary | ICD-10-CM

## 2019-12-17 DIAGNOSIS — G43411 Hemiplegic migraine, intractable, with status migrainosus: Secondary | ICD-10-CM

## 2020-01-14 ENCOUNTER — Other Ambulatory Visit: Payer: Self-pay | Admitting: Neurology

## 2020-01-26 ENCOUNTER — Ambulatory Visit: Payer: Medicare Other | Admitting: Osteopathic Medicine

## 2020-02-03 ENCOUNTER — Other Ambulatory Visit: Payer: Self-pay | Admitting: *Deleted

## 2020-02-03 MED ORDER — BD SYRINGE LUER-LOK 3 ML MISC: 0 refills | Status: DC

## 2020-02-03 MED ORDER — "NEEDLE (DISP) 23G X 1"" MISC": 0 refills | Status: DC

## 2020-02-03 NOTE — Telephone Encounter (Signed)
Refill request for needles/syringe

## 2020-02-08 ENCOUNTER — Other Ambulatory Visit: Payer: Self-pay | Admitting: Neurology

## 2020-02-09 ENCOUNTER — Telehealth: Payer: Self-pay | Admitting: *Deleted

## 2020-02-09 ENCOUNTER — Encounter: Payer: Self-pay | Admitting: *Deleted

## 2020-02-09 NOTE — Telephone Encounter (Signed)
I called [employer] and spoke to [facility].  She states that [id number] and [id number] are billable and do not require PA.  Ref# for call is [phone].

## 2020-02-10 ENCOUNTER — Encounter: Payer: Self-pay | Admitting: *Deleted

## 2020-02-10 NOTE — Progress Notes (Signed)
Updated chart to clarify that patient is NOT breastfeeding.

## 2020-02-16 ENCOUNTER — Other Ambulatory Visit: Payer: Self-pay

## 2020-02-16 ENCOUNTER — Ambulatory Visit (INDEPENDENT_AMBULATORY_CARE_PROVIDER_SITE_OTHER): Payer: Medicare Other | Admitting: Neurology

## 2020-02-16 VITALS — Temp 98.1°F

## 2020-02-16 DIAGNOSIS — G43711 Chronic migraine without aura, intractable, with status migrainosus: Secondary | ICD-10-CM | POA: Diagnosis not present

## 2020-02-16 MED ORDER — KETOROLAC TROMETHAMINE 60 MG/2ML IM SOLN: INTRAMUSCULAR | 3 refills | Status: DC

## 2020-02-16 MED ORDER — BUTALBITAL-APAP-CAFFEINE 50-325-40 MG PO TABS: 1.0000 | ORAL_TABLET | Freq: Four times a day (QID) | ORAL | 5 refills | Status: DC | PRN

## 2020-02-16 MED ORDER — HYDROCODONE-ACETAMINOPHEN 5-325 MG PO TABS: 1.0000 | ORAL_TABLET | Freq: Four times a day (QID) | ORAL | 0 refills | Status: DC | PRN

## 2020-02-16 NOTE — Progress Notes (Signed)
Consent Form Botulism Toxin Injection For Chronic Migraine 11/17/2019: Still doing very well, > 50% improvement in frequency of migraines.  Ubrelvy caused stomach discomfort. Lasmiditan did not work. Try Nurtec. Continue Fioricet, only acute med that works. Did extra in the cervical paraspinals, also across the occipitalis in the back as well "around the ponytail"   Reviewed orally with patient, additionally signature is on file:Consent Form Botulism Toxin Injection For Chronic Migraine    Reviewed orally with patient, additionally signature is on file:  Botulism toxin has been approved by the Federal drug administration for treatment of chronic migraine. Botulism toxin does not cure chronic migraine and it may not be effective in some patients.  The administration of botulism toxin is accomplished by injecting a small amount of toxin into the muscles of the neck and head. Dosage must be titrated for each individual. Any benefits resulting from botulism toxin tend to wear off after 3 months with a repeat injection required if benefit is to be maintained. Injections are usually done every 3-4 months with maximum effect peak achieved by about 2 or 3 weeks. Botulism toxin is expensive and you should be sure of what costs you will incur resulting from the injection.  The side effects of botulism toxin use for chronic migraine may include:   -Transient, and usually mild, facial weakness with facial injections  -Transient, and usually mild, head or neck weakness with head/neck injections  -Reduction or loss of forehead facial animation due to forehead muscle weakness  -Eyelid drooping  -Dry eye  -Pain at the site of injection or bruising at the site of injection  -Double vision  -Potential unknown long term risks  Contraindications: You should not have Botox if you are pregnant, nursing, allergic to albumin, have an infection, skin condition, or muscle weakness at the site of the injection,  or have myasthenia gravis, Lambert-Eaton syndrome, or ALS.  It is also possible that as with any injection, there may be an allergic reaction or no effect from the medication. Reduced effectiveness after repeated injections is sometimes seen and rarely infection at the injection site may occur. All care will be taken to prevent these side effects. If therapy is given over a long time, atrophy and wasting in the muscle injected may occur. Occasionally the patient's become refractory to treatment because they develop antibodies to the toxin. In this event, therapy needs to be modified.  I have read the above information and consent to the administration of botulism toxin.    BOTOX PROCEDURE NOTE FOR MIGRAINE HEADACHE    Contraindications and precautions discussed with patient(above). Aseptic procedure was observed and patient tolerated procedure. Procedure performed by Dr. Artemio Aly  The condition has existed for more than 6 months, and pt does not have a diagnosis of ALS, Myasthenia Gravis or Lambert-Eaton Syndrome.  Risks and benefits of injections discussed and pt agrees to proceed with the procedure.  Written consent obtained  These injections are medically necessary. Pt  receives good benefits from these injections. These injections do not cause sedations or hallucinations which the oral therapies may cause.  Description of procedure:  The patient was placed in a sitting position. The standard protocol was used for Botox as follows, with 5 units of Botox injected at each site:   -Procerus muscle, midline injection  -Corrugator muscle, bilateral injection  -Frontalis muscle, bilateral injection, with 2 sites each side, medial injection was performed in the upper one third of the frontalis muscle, in the region  vertical from the medial inferior edge of the superior orbital rim. The lateral injection was again in the upper one third of the forehead vertically above the lateral limbus of  the cornea, 1.5 cm lateral to the medial injection site.  -Temporalis muscle injection, 4 sites, bilaterally. The first injection was 3 cm above the tragus of the ear, second injection site was 1.5 cm to 3 cm up from the first injection site in line with the tragus of the ear. The third injection site was 1.5-3 cm forward between the first 2 injection sites. The fourth injection site was 1.5 cm posterior to the second injection site.   -Occipitalis muscle injection, 3 sites, bilaterally. The first injection was done one half way between the occipital protuberance and the tip of the mastoid process behind the ear. The second injection site was done lateral and superior to the first, 1 fingerbreadth from the first injection. The third injection site was 1 fingerbreadth superiorly and medially from the first injection site.  -Cervical paraspinal muscle injection, 2 sites, bilateral knee first injection site was 1 cm from the midline of the cervical spine, 3 cm inferior to the lower border of the occipital protuberance. The second injection site was 1.5 cm superiorly and laterally to the first injection site.  -Trapezius muscle injection was performed at 3 sites, bilaterally. The first injection site was in the upper trapezius muscle halfway between the inflection point of the neck, and the acromion. The second injection site was one half way between the acromion and the first injection site. The third injection was done between the first injection site and the inflection point of the neck.   Will return for repeat injection in 3 months.   200 units of Botox was used, any Botox not injected was wasted. The patient tolerated the procedure well, there were no complications of the above procedure.

## 2020-02-16 NOTE — Progress Notes (Signed)
Botox- 100 units x 2 vials Lot: [id number] Expiration: 08/2022 NDC: 4401-0272-53 & Lot: [id number] Expiration: 02/2022 NDC: 7425-9563-87  Bacteriostatic 0.9% Sodium Chloride- 23mL total Lot: [id number] Expiration: 02/18/2020 NDC: 9518-8416-60  Dx: Y30.160 B/B

## 2020-02-17 ENCOUNTER — Ambulatory Visit: Payer: Medicare Other | Admitting: Neurology

## 2020-03-03 ENCOUNTER — Telehealth: Payer: Self-pay | Admitting: Neurology

## 2020-03-03 NOTE — Telephone Encounter (Signed)
Pt called requesting a CB from rn states she has been having worsening migraine  and would like to get a steroid shot to break cycle

## 2020-03-03 NOTE — Telephone Encounter (Signed)
I tried to reach pt again on her cell but she didn't answer. Need to update allergies.

## 2020-03-03 NOTE — Telephone Encounter (Signed)
Spoke with Dr. Lucia Gaskins. She will offer pt a medrol dose pack. I called the pt & LVM asking for call back. Also advised her I would send a mychart message.

## 2020-03-16 ENCOUNTER — Other Ambulatory Visit: Payer: Self-pay | Admitting: Neurology

## 2020-03-16 DIAGNOSIS — G43411 Hemiplegic migraine, intractable, with status migrainosus: Secondary | ICD-10-CM

## 2020-03-16 DIAGNOSIS — G43711 Chronic migraine without aura, intractable, with status migrainosus: Secondary | ICD-10-CM

## 2020-04-11 ENCOUNTER — Encounter: Payer: Self-pay | Admitting: Obstetrics & Gynecology

## 2020-04-11 ENCOUNTER — Other Ambulatory Visit: Payer: Self-pay

## 2020-04-11 ENCOUNTER — Other Ambulatory Visit (HOSPITAL_COMMUNITY)
Admission: RE | Admit: 2020-04-11 | Discharge: 2020-04-11 | Disposition: A | Discharge status: Home / Self Care | Payer: Medicare Other | Source: Ambulatory Visit | Attending: Obstetrics & Gynecology | Admitting: Obstetrics & Gynecology

## 2020-04-11 ENCOUNTER — Ambulatory Visit (INDEPENDENT_AMBULATORY_CARE_PROVIDER_SITE_OTHER): Payer: Medicare Other | Admitting: Obstetrics & Gynecology

## 2020-04-11 VITALS — BP 123/78 | HR 94 | Ht 67.0 in | Wt 153.0 lb

## 2020-04-11 DIAGNOSIS — Z01419 Encounter for gynecological examination (general) (routine) without abnormal findings: Secondary | ICD-10-CM | POA: Insufficient documentation

## 2020-04-11 DIAGNOSIS — R87613 High grade squamous intraepithelial lesion on cytologic smear of cervix (HGSIL): Secondary | ICD-10-CM | POA: Insufficient documentation

## 2020-04-11 DIAGNOSIS — Z1151 Encounter for screening for human papillomavirus (HPV): Secondary | ICD-10-CM | POA: Insufficient documentation

## 2020-04-11 NOTE — Progress Notes (Signed)
   Subjective:    Patient ID: Holly [name], female    DOB: 1976/10/28, 44 y.o.   MRN: [id number]  HPI  Pt presents for annual exam and complaints of continue irregular bleeding.  Pt is on POPs for menstrual migraine control.  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  POPs have side effect of spotting and irregular bleeding.  Pt would like to continue pills b/c she thinks it helps her headaches.    Past Medical History:  Diagnosis Date  . Depression   . Fibromyalgia 2018  . GERD (gastroesophageal reflux disease)   . Hemiplegia (HCC)   . IBS (irritable bowel syndrome)   . Migraine   . MRSA (methicillin resistant staph aureus) culture positive   . Vaginal Pap smear, abnormal    Past Surgical History:  Procedure Laterality Date  . WRIST SURGERY     cyst removal   Family History  Problem Relation Age of Onset  . Thyroid disease Mother   . Scoliosis Mother   . Depression Mother   . Migraines Mother   . Hyperlipidemia Mother   . Hypertension Mother   . Heart attack Mother   . Crohn's disease Father   . Autoimmune disease Father   . Factor V Leiden deficiency Father   . Heart failure Father   . Diabetes Father   . Hyperlipidemia Father   . Heart attack Father   . Cancer Sister   . Depression Sister   . Diabetes Sister   . Diabetes Paternal Grandfather    Review of Systems  Constitutional: Negative.   Respiratory: Negative.   Cardiovascular: Negative.   Gastrointestinal: Negative.   Genitourinary: Positive for vaginal bleeding.  Skin: Negative.   Neurological: Positive for headaches.  Psychiatric/Behavioral: Negative.        Pt doing well in spite of 59 year old son being diagnosed with aggressive thyroid cancer.         Objective:   Physical Exam Vitals reviewed.  Constitutional:      General: She is not in acute distress.    Appearance: She is well-developed.  HENT:     Head: Normocephalic and atraumatic.  Eyes:     Conjunctiva/sclera: Conjunctivae normal.    Cardiovascular:     Rate and Rhythm: Normal rate.  Pulmonary:     Effort: Pulmonary effort is normal.  Abdominal:     General: Abdomen is flat.     Palpations: Abdomen is soft.     Tenderness: There is no abdominal tenderness.  Genitourinary:    Comments: Tanner V Vulva:  No lesion Vagina:  Pink, no lesions, no discharge, no blood Cervix:  No CMT Uterus:  Non tender, mobile Right adnexa--non tender, no mass Left adnexa--non tender, no mass  Skin:    General: Skin is warm and dry.  Neurological:     Mental Status: She is alert and oriented to person, place, and time.    Vitals:   04/11/20 1417  BP: 123/78  Pulse: 94  Weight: 153 lb (69.4 kg)  Height: 5\' 7"  (1.702 m)      Assessment & Plan:  44 yo female for annual exam and c/o continued irregular bleeding on POPs. 1.  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  Will need to repeat biopsy if bleeding continues. 2.  Pap with cotesting 3.  Mammogram 4.  Has PCP for routine health maintenance.

## 2020-04-12 ENCOUNTER — Other Ambulatory Visit: Payer: Medicare Other

## 2020-04-12 LAB — CYTOLOGY - PAP
Comment: NEGATIVE
Diagnosis: HIGH — AB
High risk HPV: POSITIVE — AB

## 2020-04-13 ENCOUNTER — Other Ambulatory Visit: Payer: Self-pay | Admitting: Neurology

## 2020-04-14 ENCOUNTER — Encounter: Payer: Self-pay | Admitting: *Deleted

## 2020-04-15 ENCOUNTER — Telehealth: Payer: Self-pay | Admitting: *Deleted

## 2020-04-15 NOTE — Telephone Encounter (Signed)
Left patient an urgent message to call and schedule colpo with Dr. Penne Lash on 05/16/2020.

## 2020-04-20 ENCOUNTER — Ambulatory Visit (INDEPENDENT_AMBULATORY_CARE_PROVIDER_SITE_OTHER): Payer: Medicare Other

## 2020-04-20 ENCOUNTER — Other Ambulatory Visit: Payer: Self-pay

## 2020-04-20 ENCOUNTER — Telehealth: Payer: Self-pay | Admitting: *Deleted

## 2020-04-20 DIAGNOSIS — Z01419 Encounter for gynecological examination (general) (routine) without abnormal findings: Secondary | ICD-10-CM

## 2020-04-20 DIAGNOSIS — Z1231 Encounter for screening mammogram for malignant neoplasm of breast: Secondary | ICD-10-CM

## 2020-04-20 NOTE — Telephone Encounter (Signed)
Left patient urgent message to call and schedule Colpo with Dr. Penne Lash on 05/16/2020.

## 2020-04-26 ENCOUNTER — Other Ambulatory Visit: Payer: Self-pay | Admitting: Neurology

## 2020-04-26 MED ORDER — DEXAMETHASONE 2 MG PO TABS: ORAL_TABLET | ORAL | 0 refills | Status: DC

## 2020-04-26 NOTE — Telephone Encounter (Signed)
Spoke with pt to clarify her vision complaints. She is referring to the normal blurred vision she has with a migraine. Pt took Fioricet and feels the migraines are worse because of the storms. She will await a call back/message with on call doctor's recommendations. Pt verbalized appreciation. She will proceed to urgent care or ER if needed.

## 2020-05-06 ENCOUNTER — Encounter: Payer: Self-pay | Admitting: Osteopathic Medicine

## 2020-05-10 ENCOUNTER — Telehealth: Payer: Self-pay | Admitting: Neurology

## 2020-05-10 NOTE — Telephone Encounter (Signed)
I called [employer] [phone]) and spoke with Jana Half. She states that codes [id number] and [id number] are valid and billable and do not require PA. Patient is B/B. Reference #[id number].

## 2020-05-15 ENCOUNTER — Other Ambulatory Visit: Payer: Self-pay | Admitting: Osteopathic Medicine

## 2020-05-16 ENCOUNTER — Ambulatory Visit (INDEPENDENT_AMBULATORY_CARE_PROVIDER_SITE_OTHER): Payer: Medicare Other | Admitting: Obstetrics & Gynecology

## 2020-05-16 ENCOUNTER — Other Ambulatory Visit: Payer: Self-pay | Admitting: Obstetrics & Gynecology

## 2020-05-16 ENCOUNTER — Other Ambulatory Visit: Payer: Self-pay

## 2020-05-16 ENCOUNTER — Encounter: Payer: Self-pay | Admitting: Obstetrics & Gynecology

## 2020-05-16 VITALS — BP 118/70 | HR 95 | Resp 16 | Ht 67.0 in | Wt 165.0 lb

## 2020-05-16 DIAGNOSIS — Z3202 Encounter for pregnancy test, result negative: Secondary | ICD-10-CM | POA: Diagnosis not present

## 2020-05-16 DIAGNOSIS — R87612 Low grade squamous intraepithelial lesion on cytologic smear of cervix (LGSIL): Secondary | ICD-10-CM

## 2020-05-16 DIAGNOSIS — R87613 High grade squamous intraepithelial lesion on cytologic smear of cervix (HGSIL): Secondary | ICD-10-CM

## 2020-05-16 DIAGNOSIS — N871 Moderate cervical dysplasia: Secondary | ICD-10-CM

## 2020-05-16 LAB — POCT URINE PREGNANCY: Preg Test, Ur: NEGATIVE

## 2020-05-19 ENCOUNTER — Encounter: Payer: Self-pay | Admitting: Obstetrics & Gynecology

## 2020-05-19 NOTE — Progress Notes (Signed)
Colposcopy Procedure Note  Indications: Pap smear 1 months ago showed: high-grade squamous intraepithelial neoplasia  (HGSIL-encompassing moderate and severe dysplasia). The prior pap showed ASC cannot exclude high grade lesion [facility]).  Prior cervical/vaginal disease: CIN 2. 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  Procedure Details  The risks and benefits of the procedure and Written informed consent obtained.  Speculum placed in vagina and excellent visualization of cervix achieved, cervix swabbed x 3 with acetic acid solution.  Findings: Cervix: acetowhite lesion(s) noted at 12,1,6 o'clock; cervix swabbed with Lugol's solution.   Vaginal inspection: vaginal colposcopy not performed. Vulvar colposcopy: vulvar colposcopy not performed.  Specimens: ECC, cervical biopsy at 12, 1 and 6  Complications: none.  Plan: Specimens labelled and sent to Pathology. Will base further treatment on Pathology findings. Post biopsy instructions given to patient.

## 2020-05-20 ENCOUNTER — Telehealth: Payer: Self-pay | Admitting: *Deleted

## 2020-05-20 NOTE — Telephone Encounter (Signed)
Left patient a message to call and schedule LEEP with Dr. Penne Lash on 06/20/2020 or Dr. Earlene Plater on 06/06/20 or after.

## 2020-05-24 ENCOUNTER — Ambulatory Visit (INDEPENDENT_AMBULATORY_CARE_PROVIDER_SITE_OTHER): Payer: Medicare Other | Admitting: Neurology

## 2020-05-24 DIAGNOSIS — G43411 Hemiplegic migraine, intractable, with status migrainosus: Secondary | ICD-10-CM

## 2020-05-24 DIAGNOSIS — G43711 Chronic migraine without aura, intractable, with status migrainosus: Secondary | ICD-10-CM | POA: Diagnosis not present

## 2020-05-24 MED ORDER — TROKENDI XR 100 MG PO CP24: 1.0000 | ORAL_CAPSULE | Freq: Every day | ORAL | 2 refills | Status: DC

## 2020-05-24 MED ORDER — PROCHLORPERAZINE MALEATE 10 MG PO TABS: 10.0000 mg | ORAL_TABLET | Freq: Four times a day (QID) | ORAL | 11 refills | Status: DC | PRN

## 2020-05-24 MED ORDER — KETOROLAC TROMETHAMINE 60 MG/2ML IM SOLN: INTRAMUSCULAR | 3 refills | Status: DC

## 2020-05-24 MED ORDER — "NEEDLE (DISP) 23G X 1"" MISC": 0 refills | Status: DC

## 2020-05-24 MED ORDER — ONDANSETRON 4 MG PO TBDP: 4.0000 mg | ORAL_TABLET | Freq: Three times a day (TID) | ORAL | 11 refills | Status: DC | PRN

## 2020-05-24 MED ORDER — BUTALBITAL-APAP-CAFFEINE 50-325-40 MG PO TABS: 1.0000 | ORAL_TABLET | Freq: Four times a day (QID) | ORAL | 5 refills | Status: DC | PRN

## 2020-05-24 MED ORDER — METHYLPREDNISOLONE 4 MG PO TBPK: ORAL_TABLET | ORAL | 1 refills | Status: DC

## 2020-05-24 MED ORDER — PROMETHAZINE HCL 25 MG RE SUPP: 25.0000 mg | Freq: Four times a day (QID) | RECTAL | 6 refills | Status: DC | PRN

## 2020-05-24 MED ORDER — EMGALITY 120 MG/ML ~~LOC~~ SOAJ: 120.0000 mg | SUBCUTANEOUS | 11 refills | Status: DC

## 2020-05-24 MED ORDER — BD SYRINGE LUER-LOK 3 ML MISC: 0 refills | Status: DC

## 2020-05-24 NOTE — Progress Notes (Signed)
Consent Form Botulism Toxin Injection For Chronic Migraine  05/24/2020: Doing extremely well on botox > 55% improvement in frequency and severity of migraines and headaches per patient today.Ubrelvy caused stomach discomfort. Lasmiditan did not work. Try Nurtec. Continue Fioricet, only acute med that works. Did extra in the cervical paraspinals, also across the occipitalis in the back as well "around the ponytail". DO NOT DO ANY IN THE LS it made it worse just a little extra in the occipital area and in the cervical paraspinals and traps. Her son has migraines and is 88 we will see him next year when he is closer to 77.   11/17/2019: Still doing very well, > 50% improvement in frequency of migraines.  Ubrelvy caused stomach discomfort. Lasmiditan did not work. Try Nurtec. Continue Fioricet, only acute med that works. Did extra in the cervical paraspinals, also across the occipitalis in the back as well "around the ponytail"   Reviewed orally with patient, additionally signature is on file:Consent Form Botulism Toxin Injection For Chronic Migraine    Reviewed orally with patient, additionally signature is on file:  Botulism toxin has been approved by the Federal drug administration for treatment of chronic migraine. Botulism toxin does not cure chronic migraine and it may not be effective in some patients.  The administration of botulism toxin is accomplished by injecting a small amount of toxin into the muscles of the neck and head. Dosage must be titrated for each individual. Any benefits resulting from botulism toxin tend to wear off after 3 months with a repeat injection required if benefit is to be maintained. Injections are usually done every 3-4 months with maximum effect peak achieved by about 2 or 3 weeks. Botulism toxin is expensive and you should be sure of what costs you will incur resulting from the injection.  The side effects of botulism toxin use for chronic migraine may  include:   -Transient, and usually mild, facial weakness with facial injections  -Transient, and usually mild, head or neck weakness with head/neck injections  -Reduction or loss of forehead facial animation due to forehead muscle weakness  -Eyelid drooping  -Dry eye  -Pain at the site of injection or bruising at the site of injection  -Double vision  -Potential unknown long term risks  Contraindications: You should not have Botox if you are pregnant, nursing, allergic to albumin, have an infection, skin condition, or muscle weakness at the site of the injection, or have myasthenia gravis, Lambert-Eaton syndrome, or ALS.  It is also possible that as with any injection, there may be an allergic reaction or no effect from the medication. Reduced effectiveness after repeated injections is sometimes seen and rarely infection at the injection site may occur. All care will be taken to prevent these side effects. If therapy is given over a long time, atrophy and wasting in the muscle injected may occur. Occasionally the patient's become refractory to treatment because they develop antibodies to the toxin. In this event, therapy needs to be modified.  I have read the above information and consent to the administration of botulism toxin.    BOTOX PROCEDURE NOTE FOR MIGRAINE HEADACHE    Contraindications and precautions discussed with patient(above). Aseptic procedure was observed and patient tolerated procedure. Procedure performed by Dr. Artemio Aly  The condition has existed for more than 6 months, and pt does not have a diagnosis of ALS, Myasthenia Gravis or Lambert-Eaton Syndrome.  Risks and benefits of injections discussed and pt agrees to proceed with  the procedure.  Written consent obtained  These injections are medically necessary. Pt  receives good benefits from these injections. These injections do not cause sedations or hallucinations which the oral therapies may cause.  Description of  procedure:  The patient was placed in a sitting position. The standard protocol was used for Botox as follows, with 5 units of Botox injected at each site:   -Procerus muscle, midline injection  -Corrugator muscle, bilateral injection  -Frontalis muscle, bilateral injection, with 2 sites each side, medial injection was performed in the upper one third of the frontalis muscle, in the region vertical from the medial inferior edge of the superior orbital rim. The lateral injection was again in the upper one third of the forehead vertically above the lateral limbus of the cornea, 1.5 cm lateral to the medial injection site.  -Temporalis muscle injection, 4 sites, bilaterally. The first injection was 3 cm above the tragus of the ear, second injection site was 1.5 cm to 3 cm up from the first injection site in line with the tragus of the ear. The third injection site was 1.5-3 cm forward between the first 2 injection sites. The fourth injection site was 1.5 cm posterior to the second injection site.   -Occipitalis muscle injection, 3 sites, bilaterally. The first injection was done one half way between the occipital protuberance and the tip of the mastoid process behind the ear. The second injection site was done lateral and superior to the first, 1 fingerbreadth from the first injection. The third injection site was 1 fingerbreadth superiorly and medially from the first injection site.  -Cervical paraspinal muscle injection, 2 sites, bilateral knee first injection site was 1 cm from the midline of the cervical spine, 3 cm inferior to the lower border of the occipital protuberance. The second injection site was 1.5 cm superiorly and laterally to the first injection site.  -Trapezius muscle injection was performed at 3 sites, bilaterally. The first injection site was in the upper trapezius muscle halfway between the inflection point of the neck, and the acromion. The second injection site was one half way  between the acromion and the first injection site. The third injection was done between the first injection site and the inflection point of the neck.   Will return for repeat injection in 3 months.   200 units of Botox was used, any Botox not injected was wasted. The patient tolerated the procedure well, there were no complications of the above procedure.

## 2020-05-24 NOTE — Progress Notes (Signed)
Botox- 200 units x 1 vial Lot: [id number] Expiration: 01/2023 NDC: 0023-3921-02  Bacteriostatic 0.9% Sodium Chloride- 4mL total Lot: [id number] Expiration: 08/19/2021 NDC: 0409-1966-02  Dx: G43.711 B/B  

## 2020-06-09 ENCOUNTER — Encounter: Payer: Self-pay | Admitting: Obstetrics and Gynecology

## 2020-06-09 ENCOUNTER — Other Ambulatory Visit: Payer: Self-pay

## 2020-06-09 ENCOUNTER — Ambulatory Visit (INDEPENDENT_AMBULATORY_CARE_PROVIDER_SITE_OTHER): Payer: Medicare Other | Admitting: Obstetrics and Gynecology

## 2020-06-09 VITALS — BP 126/72 | HR 93 | Ht 67.0 in

## 2020-06-09 DIAGNOSIS — Z01812 Encounter for preprocedural laboratory examination: Secondary | ICD-10-CM | POA: Diagnosis not present

## 2020-06-09 DIAGNOSIS — Z3202 Encounter for pregnancy test, result negative: Secondary | ICD-10-CM

## 2020-06-09 DIAGNOSIS — D069 Carcinoma in situ of cervix, unspecified: Secondary | ICD-10-CM

## 2020-06-09 LAB — POCT URINE PREGNANCY: Preg Test, Ur: NEGATIVE

## 2020-06-09 NOTE — Progress Notes (Signed)
GYNECOLOGY OFFICE FOLLOW UP NOTE  History:  44 y.o. [id number] here today for follow up for discussion of results of colpo.  2019: LEEP for CIN II, repeat pap CIN 1 Pap: 5/21: HGSIL, positive high risk HPV Colpo Biopsy: 6/21: CIN II-III ECC: benign    Past Medical History:  Diagnosis Date  . Depression   . Fibromyalgia 2018  . GERD (gastroesophageal reflux disease)   . Hemiplegia (HCC)   . IBS (irritable bowel syndrome)   . Migraine   . MRSA (methicillin resistant staph aureus) culture positive   . Vaginal Pap smear, abnormal     Past Surgical History:  Procedure Laterality Date  . WRIST SURGERY     cyst removal     Current Outpatient Medications:  .  ALLERGY RELIEF/NASAL DECONGEST 10-240 MG 24 hr tablet, TAKE ONE TABLET BY MOUTH DAILY, Disp: 30 tablet, Rfl: 11 .  B-D 3CC LUER-LOK SYR 23GX1" 23G X 1" 3 ML MISC, Attach to syringe to draw up and administer Toradol into the muscle., Disp: 4 each, Rfl: 0 .  butalbital-acetaminophen-caffeine (FIORICET) 50-325-40 MG tablet, Take 1-2 tablets by mouth every 6 (six) hours as needed for headache., Disp: 15 tablet, Rfl: 5 .  FLUoxetine (PROZAC) 10 MG capsule, Take 1 capsule (10 mg total) by mouth daily. Take with 20 mg capsules for total daily dose 30 mg, Disp: 90 capsule, Rfl: 3 .  FLUoxetine (PROZAC) 20 MG capsule, Take 1 capsule (20 mg total) by mouth daily., Disp: 90 capsule, Rfl: 3 .  fluticasone (FLONASE) 50 MCG/ACT nasal spray, Place 2 sprays into both nostrils daily., Disp: 16 g, Rfl: 1 .  Galcanezumab-gnlm (EMGALITY) 120 MG/ML SOAJ, Inject 120 mg into the skin every 30 (thirty) days., Disp: 1 pen, Rfl: 11 .  HYDROcodone-acetaminophen (NORCO/VICODIN) 5-325 MG tablet, Take 1 tablet by mouth every 6 (six) hours as needed for moderate pain., Disp: 28 tablet, Rfl: 0 .  ibuprofen (ADVIL) 800 MG tablet, Take 1 tablet (800 mg total) by mouth every 8 (eight) hours as needed., Disp: 90 tablet, Rfl: 0 .  ketorolac (TORADOL) 60 MG/2ML SOLN  injection, INJECT 60 MG(2 ML) INTO THE MUSCLE EVERY 6 HOURS AS NEEDED FOR MIGRAINE. MAXIMUM DAILY DOSE IS 120 MG. DO NOT USE MORE THAN 4 DAYS IN A MONTH., Disp: 8 mL, Rfl: 3 .  methylPREDNISolone (MEDROL DOSEPAK) 4 MG TBPK tablet, follow package directions, Disp: 21 tablet, Rfl: 1 .  montelukast (SINGULAIR) 10 MG tablet, Take 1 tablet (10 mg) by mouth daily in the evening, Disp: 90 tablet, Rfl: 3 .  NEEDLE, DISP, 23 G 23G X 1" MISC, Attach to syringe to draw up and administer Toradol into the muscle., Disp: 4 each, Rfl: 0 .  norethindrone (MICRONOR) 0.35 MG tablet, Take 1 tablet (0.35 mg total) by mouth daily., Disp: 84 tablet, Rfl: 3 .  omeprazole (PRILOSEC) 40 MG capsule, Take 1 capsule (40 mg total) by mouth daily., Disp: 90 capsule, Rfl: 3 .  ondansetron (ZOFRAN-ODT) 4 MG disintegrating tablet, Take 1 tablet (4 mg total) by mouth every 8 (eight) hours as needed for nausea., Disp: 30 tablet, Rfl: 11 .  prochlorperazine (COMPAZINE) 10 MG tablet, Take 1 tablet (10 mg total) by mouth every 6 (six) hours as needed., Disp: 30 tablet, Rfl: 11 .  promethazine (PHENERGAN) 25 MG suppository, Place 1 suppository (25 mg total) rectally every 6 (six) hours as needed for nausea or vomiting., Disp: 12 each, Rfl: 6 .  Syringe, Disposable, (B-D SYRINGE LUER-LOK 3CC)  3 ML MISC, Attach to needle to draw up and administer Toradol into the muscle., Disp: 4 each, Rfl: 0 .  Topiramate ER (TROKENDI XR) 100 MG CP24, Take 1 capsule by mouth at bedtime., Disp: 30 capsule, Rfl: 2 .  triamcinolone cream (KENALOG) 0.1 %, Apply 1 application topically 2 (two) times daily. To affected area(s) as needed, Disp: 45 g, Rfl: 1  The following portions of the patient's history were reviewed and updated as appropriate: allergies, current medications, past family history, past medical history, past social history, past surgical history and problem list.   Review of Systems:  Pertinent items noted in HPI and remainder of comprehensive  ROS otherwise negative.   Objective:  Physical Exam BP 126/72   Pulse 93   Ht 5\' 7"  (1.702 m)   LMP 05/16/2020   BMI 25.84 kg/m  CONSTITUTIONAL: Well-developed, well-nourished female in no acute distress.  HENT:  Normocephalic, atraumatic. External right and left ear normal. Oropharynx is clear and moist EYES: Conjunctivae and EOM are normal. Pupils are equal, round, and reactive to light. No scleral icterus.  NECK: Normal range of motion, supple, no masses SKIN: Skin is warm and dry. No rash noted. Not diaphoretic. No erythema. No pallor. NEUROLOGIC: Alert and oriented to person, place, and time. Normal reflexes, muscle tone coordination. No cranial nerve deficit noted. PSYCHIATRIC: Normal mood and affect. Normal behavior. Normal judgment and thought content. CARDIOVASCULAR: Normal heart rate noted RESPIRATORY: Effort normal, no problems with respiration noted ABDOMEN: Soft, no distention noted.   PELVIC: Normal appearing external genitalia; normal appearing vaginal mucosa and cervix.  SCJ noted with erythema at posterior ectropion MUSCULOSKELETAL: Normal range of motion. No edema noted.  Exam done with chaperone present.  Labs and Imaging Diagnosis 1. Cervix, biopsy, 12:00, 1:00 - HIGH GRADE SQUAMOUS INTRAEPITHELIAL LESION (CIN-II TO III, MODERATE TO SEVERE SQUAMOUS DYSPLASIA/CARCINOMA IN SITU) IN TWO FRAGMENTS OF SQUAMOUS MUCOSA. - BENIGN SQUAMOUS MUCOSA, ONE FRAGMENT. 2. Cervix, biopsy, 6:00 - BENIGN ENDOCERVICAL GLANDULAR MUCOSA. NO ATYPIA, DYSPLASIA OR MALIGNANCY. 3. Endocervix, curettage - MINUTE FRAGMENTS OF BENIGN ENDOCERVICAL AND ENDOMETRIAL GLANDULAR MUCOSA. Mark 05/18/2020 MD Pathologist, Electronic Signature (Case signed 05/18/2020)  Assessment & Plan:  1. Pre-procedure lab exam - POCT urine pregnancy  2. CIN III (cervical intraepithelial neoplasia grade III) with severe dysplasia - Reviewed course, dating back to initial abnormal pap, subsequent LEEP with CIN II  on pathand abnormal pap. Repeat colpo with progression to CIN II-III on cervical biopsy with negative ECC.  - reviewed options with patient including repeat LEEP, risks/benefits including incomplete excision of abnormal tissue, damage to surrounding tissue with cautery and less tissue present for removal (higher potential for damage to surrounding structures with repeat excision) as well as cold knife cone, risks/benefits including incomplete excision, definitive knowledge about status of margins, use of anesthesia. After thorough discussion, pt opts for cold knife cone done in OR - reviewed pre-op instructions, need for COVID test, NPO after midnight, expected recovery, pt verbalizes understanding of the above and is agreement - will have scheduler call    Routine preventative health maintenance measures emphasized. Please refer to After Visit Summary for other counseling recommendations.   Return for no follow up needed at this time.  Total face-to-face time with patient: 22 minutes. Over 50% of encounter was spent on counseling and coordination of care.  05/20/2020, M.D. Attending Center for [facility] [employer])

## 2020-06-10 ENCOUNTER — Encounter: Payer: Self-pay | Admitting: Obstetrics and Gynecology

## 2020-06-27 ENCOUNTER — Encounter (HOSPITAL_BASED_OUTPATIENT_CLINIC_OR_DEPARTMENT_OTHER): Payer: Self-pay | Admitting: Obstetrics and Gynecology

## 2020-06-27 ENCOUNTER — Other Ambulatory Visit: Payer: Self-pay

## 2020-06-27 NOTE — Progress Notes (Addendum)
Spoke w/ via phone for pre-op interview---PT Lab needs dos----   URINE POCT     Lab results------none COVID test ------07-01-20 1100 am Arrive at -------530 am 07-05-2020 NPO after MN NO Solid Food.  Clear liquids from MN until---430 am then npo Medications to take morning of surgery -----proazac, flonase, prn nasal sprays,  Diabetic medication -----n/a Patient Special Instructions -----none Pre-Op special Istructions -----none Patient verbalized understanding of instructions that were given at this phone interview. Patient denies shortness of breath, chest pain, fever, cough at this phone interview.  lov neurology dr ahern(hemiplegic migraines)  05-24-2020 epic

## 2020-07-01 ENCOUNTER — Other Ambulatory Visit (HOSPITAL_COMMUNITY)
Admission: RE | Admit: 2020-07-01 | Discharge: 2020-07-01 | Disposition: A | Discharge status: Home / Self Care | Payer: Medicare Other | Source: Ambulatory Visit | Attending: Obstetrics and Gynecology | Admitting: Obstetrics and Gynecology

## 2020-07-01 DIAGNOSIS — Z20822 Contact with and (suspected) exposure to covid-19: Secondary | ICD-10-CM | POA: Diagnosis not present

## 2020-07-01 DIAGNOSIS — Z01812 Encounter for preprocedural laboratory examination: Secondary | ICD-10-CM | POA: Diagnosis not present

## 2020-07-01 LAB — SARS CORONAVIRUS 2 (TAT 6-24 HRS): SARS Coronavirus 2: NEGATIVE

## 2020-07-02 ENCOUNTER — Other Ambulatory Visit: Payer: Self-pay | Admitting: Neurology

## 2020-07-04 ENCOUNTER — Encounter (HOSPITAL_BASED_OUTPATIENT_CLINIC_OR_DEPARTMENT_OTHER): Payer: Self-pay | Admitting: Obstetrics and Gynecology

## 2020-07-04 NOTE — Anesthesia Preprocedure Evaluation (Addendum)
Anesthesia Evaluation  Patient identified by MRN, date of birth, ID band Patient awake    Reviewed: Allergy & Precautions, NPO status , Patient's Chart, lab work & pertinent test results  History of Anesthesia Complications (+) Family history of anesthesia reaction  Airway Mallampati: II  TM Distance: >3 FB Neck ROM: Full    Dental  (+) Poor Dentition   Pulmonary neg pulmonary ROS,    Pulmonary exam normal breath sounds clear to auscultation       Cardiovascular negative cardio ROS Normal cardiovascular exam Rhythm:Regular Rate:Normal     Neuro/Psych  Headaches, PSYCHIATRIC DISORDERS Depression  Neuromuscular disease    GI/Hepatic Neg liver ROS, GERD  Medicated and Controlled,IBS   Endo/Other  negative endocrine ROS  Renal/GU negative Renal ROS  negative genitourinary   Musculoskeletal  (+) Fibromyalgia -  Abdominal   Peds  Hematology negative hematology ROS (+)   Anesthesia Other Findings   Reproductive/Obstetrics CIN3                          Anesthesia Physical Anesthesia Plan  ASA: II  Anesthesia Plan: General   Post-op Pain Management:    Induction: Intravenous  PONV Risk Score and Plan: 4 or greater and Ondansetron, Treatment may vary due to age or medical condition and Midazolam  Airway Management Planned: LMA  Additional Equipment:   Intra-op Plan:   Post-operative Plan: Extubation in OR  Informed Consent: I have reviewed the patients History and Physical, chart, labs and discussed the procedure including the risks, benefits and alternatives for the proposed anesthesia with the patient or authorized representative who has indicated his/her understanding and acceptance.     Dental advisory given  Plan Discussed with: CRNA and Anesthesiologist  Anesthesia Plan Comments:        Anesthesia Quick Evaluation

## 2020-07-05 ENCOUNTER — Encounter (HOSPITAL_BASED_OUTPATIENT_CLINIC_OR_DEPARTMENT_OTHER): Payer: Self-pay | Admitting: Obstetrics and Gynecology

## 2020-07-05 ENCOUNTER — Ambulatory Visit (HOSPITAL_BASED_OUTPATIENT_CLINIC_OR_DEPARTMENT_OTHER): Payer: Medicare Other | Admitting: Anesthesiology

## 2020-07-05 ENCOUNTER — Other Ambulatory Visit: Payer: Self-pay

## 2020-07-05 ENCOUNTER — Ambulatory Visit (HOSPITAL_BASED_OUTPATIENT_CLINIC_OR_DEPARTMENT_OTHER)
Admission: RE | Admit: 2020-07-05 | Discharge: 2020-07-05 | Disposition: A | Discharge status: Home / Self Care | Payer: Medicare Other | Source: Ambulatory Visit | Attending: Obstetrics and Gynecology | Admitting: Obstetrics and Gynecology

## 2020-07-05 ENCOUNTER — Encounter (HOSPITAL_BASED_OUTPATIENT_CLINIC_OR_DEPARTMENT_OTHER)
Admission: RE | Disposition: A | Discharge status: Home / Self Care | Payer: Self-pay | Source: Ambulatory Visit | Attending: Obstetrics and Gynecology

## 2020-07-05 DIAGNOSIS — F329 Major depressive disorder, single episode, unspecified: Secondary | ICD-10-CM | POA: Insufficient documentation

## 2020-07-05 DIAGNOSIS — L309 Dermatitis, unspecified: Secondary | ICD-10-CM | POA: Diagnosis not present

## 2020-07-05 DIAGNOSIS — D069 Carcinoma in situ of cervix, unspecified: Secondary | ICD-10-CM | POA: Diagnosis not present

## 2020-07-05 DIAGNOSIS — E559 Vitamin D deficiency, unspecified: Secondary | ICD-10-CM | POA: Diagnosis not present

## 2020-07-05 DIAGNOSIS — D061 Carcinoma in situ of exocervix: Secondary | ICD-10-CM

## 2020-07-05 DIAGNOSIS — Z79899 Other long term (current) drug therapy: Secondary | ICD-10-CM | POA: Insufficient documentation

## 2020-07-05 DIAGNOSIS — M797 Fibromyalgia: Secondary | ICD-10-CM | POA: Diagnosis not present

## 2020-07-05 DIAGNOSIS — N871 Moderate cervical dysplasia: Secondary | ICD-10-CM | POA: Diagnosis not present

## 2020-07-05 DIAGNOSIS — Z793 Long term (current) use of hormonal contraceptives: Secondary | ICD-10-CM | POA: Insufficient documentation

## 2020-07-05 DIAGNOSIS — K219 Gastro-esophageal reflux disease without esophagitis: Secondary | ICD-10-CM | POA: Insufficient documentation

## 2020-07-05 DIAGNOSIS — G43409 Hemiplegic migraine, not intractable, without status migrainosus: Secondary | ICD-10-CM | POA: Diagnosis not present

## 2020-07-05 HISTORY — DX: Dermatitis, unspecified: L30.9

## 2020-07-05 HISTORY — DX: Hemiplegic migraine, not intractable, without status migrainosus: G43.409

## 2020-07-05 HISTORY — DX: Family history of other specified conditions: Z84.89

## 2020-07-05 HISTORY — PX: CERVICAL CONIZATION W/BX: SHX1330

## 2020-07-05 LAB — POCT PREGNANCY, URINE: Preg Test, Ur: NEGATIVE

## 2020-07-05 LAB — POCT I-STAT, CHEM 8
BUN: 10 mg/dL (ref 6–20)
Calcium, Ion: 1.22 mmol/L (ref 1.15–1.40)
Chloride: 105 mmol/L (ref 98–111)
Creatinine, Ser: 1 mg/dL (ref 0.44–1.00)
Glucose, Bld: 95 mg/dL (ref 70–99)
HCT: 36 % (ref 36.0–46.0)
Hemoglobin: 12.2 g/dL (ref 12.0–15.0)
Potassium: 3.9 mmol/L (ref 3.5–5.1)
Sodium: 142 mmol/L (ref 135–145)
TCO2: 23 mmol/L (ref 22–32)

## 2020-07-05 SURGERY — CONE BIOPSY, CERVIX
Anesthesia: General | Site: Vagina

## 2020-07-05 MED ORDER — IBUPROFEN 800 MG PO TABS
800.0000 mg | ORAL_TABLET | Freq: Three times a day (TID) | ORAL | 1 refills | Status: DC | PRN
Start: 2020-07-05 — End: 2020-08-03

## 2020-07-05 MED ORDER — FENTANYL CITRATE (PF) 250 MCG/5ML IJ SOLN
INTRAMUSCULAR | Status: DC | PRN
  Administered 2020-07-05 (×4): 25 ug via INTRAVENOUS

## 2020-07-05 MED ORDER — MIDAZOLAM HCL 5 MG/5ML IJ SOLN
INTRAMUSCULAR | Status: DC | PRN
  Administered 2020-07-05: 2 mg via INTRAVENOUS

## 2020-07-05 MED ORDER — KETOROLAC TROMETHAMINE 30 MG/ML IJ SOLN
INTRAMUSCULAR | Status: DC | PRN
Start: 2020-07-05 — End: 2020-07-05
  Administered 2020-07-05: 30 mg via INTRAVENOUS

## 2020-07-05 MED ORDER — OXYCODONE HCL 5 MG/5ML PO SOLN: 5.0000 mg | Freq: Once | ORAL | Status: DC | PRN

## 2020-07-05 MED ORDER — IODINE STRONG (LUGOLS) 5 % PO SOLN
ORAL | Status: DC | PRN
  Administered 2020-07-05: 0.1 mL

## 2020-07-05 MED ORDER — DEXAMETHASONE SODIUM PHOSPHATE 10 MG/ML IJ SOLN
INTRAMUSCULAR | Status: DC | PRN
Start: 2020-07-05 — End: 2020-07-05
  Administered 2020-07-05: 4 mg via INTRAVENOUS

## 2020-07-05 MED ORDER — LIDOCAINE-EPINEPHRINE (PF) 1 %-1:200000 IJ SOLN
INTRAMUSCULAR | Status: DC | PRN
  Administered 2020-07-05: 11 mL

## 2020-07-05 MED ORDER — OXYCODONE HCL 5 MG PO TABS: 5.0000 mg | ORAL_TABLET | ORAL | 0 refills | Status: DC | PRN

## 2020-07-05 MED ORDER — LIDOCAINE 2% (20 MG/ML) 5 ML SYRINGE
INTRAMUSCULAR | Status: AC
  Filled 2020-07-05: qty 5

## 2020-07-05 MED ORDER — PROPOFOL 10 MG/ML IV BOLUS
INTRAVENOUS | Status: DC | PRN
  Administered 2020-07-05: 200 mg via INTRAVENOUS

## 2020-07-05 MED ORDER — LIDOCAINE 2% (20 MG/ML) 5 ML SYRINGE
INTRAMUSCULAR | Status: DC | PRN
  Administered 2020-07-05: 40 mg via INTRAVENOUS

## 2020-07-05 MED ORDER — FENTANYL CITRATE (PF) 100 MCG/2ML IJ SOLN
INTRAMUSCULAR | Status: AC
  Filled 2020-07-05: qty 2

## 2020-07-05 MED ORDER — MIDAZOLAM HCL 2 MG/2ML IJ SOLN
INTRAMUSCULAR | Status: AC
  Filled 2020-07-05: qty 2

## 2020-07-05 MED ORDER — FENTANYL CITRATE (PF) 100 MCG/2ML IJ SOLN: 25.0000 ug | INTRAMUSCULAR | Status: DC | PRN

## 2020-07-05 MED ORDER — OXYCODONE HCL 5 MG PO TABS: 5.0000 mg | ORAL_TABLET | Freq: Once | ORAL | Status: DC | PRN

## 2020-07-05 MED ORDER — ONDANSETRON HCL 4 MG/2ML IJ SOLN: 4.0000 mg | Freq: Once | INTRAMUSCULAR | Status: DC | PRN

## 2020-07-05 MED ORDER — LACTATED RINGERS IV SOLN: INTRAVENOUS | Status: DC

## 2020-07-05 MED ORDER — FERRIC SUBSULFATE SOLN
Status: DC | PRN
  Administered 2020-07-05: 1 via TOPICAL

## 2020-07-05 MED ORDER — PROPOFOL 10 MG/ML IV BOLUS
INTRAVENOUS | Status: AC
  Filled 2020-07-05: qty 20

## 2020-07-05 MED ORDER — ONDANSETRON HCL 4 MG/2ML IJ SOLN
INTRAMUSCULAR | Status: DC | PRN
  Administered 2020-07-05: 4 mg via INTRAVENOUS

## 2020-07-05 SURGICAL SUPPLY — 31 items
APL SWBSTK 6 STRL LF DISP (MISCELLANEOUS) ×1
APPLICATOR COTTON TIP 6 STRL (MISCELLANEOUS) ×1 IMPLANT
APPLICATOR COTTON TIP 6IN STRL (MISCELLANEOUS) ×3
CNTNR URN SCR LID CUP LEK RST (MISCELLANEOUS) IMPLANT
CONT SPEC 4OZ STRL OR WHT (MISCELLANEOUS) ×3
ELECT BALL LEEP 5MM RED (ELECTRODE) ×2 IMPLANT
ELECT LOOP LEEP RND 15X12 GRN (CUTTING LOOP)
ELECT LOOP LEEP RND 20X12 WHT (CUTTING LOOP)
ELECT REM PT RETURN 9FT ADLT (ELECTROSURGICAL) ×3
ELECTRODE LOOP LP RND 15X12GRN (CUTTING LOOP) IMPLANT
ELECTRODE LOOP LP RND 20X12WHT (CUTTING LOOP) IMPLANT
ELECTRODE REM PT RTRN 9FT ADLT (ELECTROSURGICAL) ×1 IMPLANT
EXTENDER ELECT LOOP LEEP 10CM (CUTTING LOOP) IMPLANT
GLOVE BIOGEL PI IND STRL 6.5 (GLOVE) ×1 IMPLANT
GLOVE BIOGEL PI INDICATOR 6.5 (GLOVE) ×4
GLOVE ECLIPSE 6.5 STRL STRAW (GLOVE) ×5 IMPLANT
GOWN STRL REUS W/TWL LRG LVL3 (GOWN DISPOSABLE) ×6 IMPLANT
HEMOSTAT SURGICEL 4X8 (HEMOSTASIS) ×2 IMPLANT
HIBICLENS CHG 4% 4OZ (MISCELLANEOUS) ×3 IMPLANT
NS IRRIG 1000ML POUR BTL (IV SOLUTION) ×1 IMPLANT
NS IRRIG 500ML POUR BTL (IV SOLUTION) ×2 IMPLANT
PACK VAGINAL MINOR WOMEN LF (CUSTOM PROCEDURE TRAY) ×3 IMPLANT
PAD OB MATERNITY 4.3X12.25 (PERSONAL CARE ITEMS) ×3 IMPLANT
PENCIL SMOKE EVAC W/HOLSTER (ELECTROSURGICAL) IMPLANT
SCOPETTES 8  STERILE (MISCELLANEOUS) ×3
SCOPETTES 8 STERILE (MISCELLANEOUS) ×2 IMPLANT
SLEEVE SURGEON STRL (DRAPES) ×2 IMPLANT
SUT ETHILON 3 0 PS 1 (SUTURE) IMPLANT
SUT VIC AB 0 CT1 27 (SUTURE) ×6
SUT VIC AB 0 CT1 27XBRD ANTBC (SUTURE) IMPLANT
TOWEL OR 17X26 10 PK STRL BLUE (TOWEL DISPOSABLE) ×4 IMPLANT

## 2020-07-05 NOTE — Discharge Instructions (Signed)
Do not put anything in the vagina for 3 weeks. Do not submerge in water for 3 weeks. You may take showers as needed. Call with any issues. Call with heavy bleeding, significant pain.      Post Anesthesia Home Care Instructions  Activity: Get plenty of rest for the remainder of the day. A responsible individual must stay with you for 24 hours following the procedure.  For the next 24 hours, DO NOT: -Drive a car -Advertising copywriter -Drink alcoholic beverages -Take any medication unless instructed by your physician -Make any legal decisions or sign important papers.  Meals: Start with liquid foods such as gelatin or soup. Progress to regular foods as tolerated. Avoid greasy, spicy, heavy foods. If nausea and/or vomiting occur, drink only clear liquids until the nausea and/or vomiting subsides. Call your physician if vomiting continues.  Special Instructions/Symptoms: Your throat may feel dry or sore from the anesthesia or the breathing tube placed in your throat during surgery. If this causes discomfort, gargle with warm salt water. The discomfort should disappear within 24 hours.

## 2020-07-05 NOTE — Op Note (Signed)
Catt[place]a A Chandley PROCEDURE DATE: 07/05/2020   PREOPERATIVE DIAGNOSIS:  CIN III, h/o LEEP  POSTOPERATIVE DIAGNOSIS: same  PROCEDURE: Cold Knife Conization, Endocervical Curettage, Colposcopy  SURGEON:  Dr. Baldemar Lenis  ASSISTANT:  none  ANESTHESIOLOGY TEAM: Anesthesiologist: Mal Amabile, MD CRNA: Lucinda Dell, CRNA  INDICATIONS: 44 y.o. [phone]  here for scheduled surgery for the aforementioned diagnoses.   Risks of surgery were discussed with the patient including but not limited to: bleeding which may require transfusion; infection which may require antibiotics; injury to vagina or surrounding organs; risk of preterm delivery with future births; need for additional procedures including laparotomy or laparoscopy; and other postoperative/anesthesia complications. Written informed consent was obtained.    FINDINGS: Normal appearing female genitalia with multiparous normal appearing cervix. Area of decreased uptake of Lugols at 1 o'clock.  ANESTHESIA:   General INTRAVENOUS FLUIDS:  850 ml of LR ESTIMATED BLOOD LOSS:  75 mL URINE OUTPUT: 500 mL clear yellow urine SPECIMENS: cold knife cone biopsy, endocervical curettage COMPLICATIONS:  None immediate.  PROCEDURE DETAILS:  The patient was seen in the pre-op area where the plan was reviewed and she again verbalized understanding and consent. She voided prior to going to operating room. She was then taken to the operating room where general anesthesia was administered.  After an adequate timeout was performed, she was placed in the dorsal lithotomy position and examined; Lugols solution was applied and the cervix was examined using the colposcope. Findings as noted above. A vaginal prep was carefully done with betadine. A weighted speculum was then placed in the patient's vagina and a Sims retractor used to visualize the cervix. Two lateral sutures were placed in the fornices at 3 and 9 o'clock with 0 vicryl and tagged with  hemostats. A single toothed tenaculum was applied to the anterior lip of the cervix. 12 mL of 1% lidocaine with epinephrine was injected circumferentially into the cervical stroma. An 11 blade was used to cut a circumferential cone noting to include the areas of poor uptake. The base of this cone was then excised and the cone was tagged at the 12 o'clock position with silk suture. The remaining bed was cauterized with the rollerball to good effect. Endocervical curettage done and sent to pathology. Monsel's solution applied to the bed with hemostasis noted. Surgicel applied to the cone bed bed and the two lateral sutures were tied together to provide additional hemostasis. The remaining instruments were removed from the vagina. The patient tolerated the procedure well and was taken to the recovery area awake, extubated and in stable condition.  The patient will be discharged to home as per PACU criteria.  Routine postoperative instructions given.  She was prescribed Oxycodone, Ibuprofen.  She will follow up in the clinic in 3 weeks for postoperative evaluation.    Baldemar Lenis, M.D. Attending Obstetrician & Gynecologist, [facility] for [employer], [facility]

## 2020-07-05 NOTE — Brief Op Note (Signed)
07/05/2020  8:43 AM  PATIENT:  Holly [name]  44 y.o. female  PRE-OPERATIVE DIAGNOSIS:  CIN 3  POST-OPERATIVE DIAGNOSIS:  CIN 3  PROCEDURE:  Procedure(s): COLD KNIFE CONIZATION CERVIX WITH BIOPSY; COLPOSCOPY AND ENDOCERVICAL CURRETAGE (N/A)  SURGEON:  Surgeon(s) and Role:    * Conan Bowens, MD - Primary  ASSISTANTS: none   ANESTHESIA:   general  EBL:  75 mL   BLOOD ADMINISTERED:none  DRAINS: none   LOCAL MEDICATIONS USED:  LIDOCAINE   SPECIMEN:  Source of Specimen:  cervix biopsy, endocevical curettage  DISPOSITION OF SPECIMEN:  PATHOLOGY  COUNTS:  YES  TOURNIQUET:  * No tourniquets in log *  DICTATION: .Note written in EPIC  PLAN OF CARE: Discharge to home after PACU  PATIENT DISPOSITION:  PACU - hemodynamically stable.   [name] start of Pharmacological VTE agent (>24hrs) due to surgical blood loss or risk of bleeding: no

## 2020-07-05 NOTE — Transfer of Care (Signed)
Immediate Anesthesia Transfer of Care Note  Patient: Holly [name]  Procedure(s) Performed: COLD KNIFE CONIZATION CERVIX WITH BIOPSY; COLPOSCOPY AND ENDOCERVICAL CURRETAGE (N/A Vagina )  Patient Location: PACU  Anesthesia Type:General  Level of Consciousness: awake, alert , oriented and patient cooperative  Airway & Oxygen Therapy: Patient Spontanous Breathing and Patient connected to face mask oxygen  Post-op Assessment: Report given to RN, Post -op Vital signs reviewed and stable and Patient moving all extremities  Post vital signs: Reviewed and stable  Last Vitals:  Vitals Value Taken Time  BP 153/87 07/05/20 0838  Temp 36.4 C 07/05/20 0838  Pulse 86 07/05/20 0842  Resp 14 07/05/20 0842  SpO2 100 % 07/05/20 0842  Vitals shown include unvalidated device data.  Last Pain:  Vitals:   07/05/20 0618  TempSrc: Oral  PainSc: 0-No pain      Patients Stated Pain Goal: 4 (07/05/20 0618)  Complications: No complications documented.

## 2020-07-05 NOTE — Anesthesia Postprocedure Evaluation (Signed)
Anesthesia Post Note  Patient: Quinnley A Kassebaum  Procedure(s) Performed: COLD KNIFE CONIZATION CERVIX WITH BIOPSY; COLPOSCOPY AND ENDOCERVICAL CURRETAGE (N/A Vagina )     Patient location during evaluation: PACU Anesthesia Type: General Level of consciousness: awake and alert and oriented Pain management: pain level controlled Vital Signs Assessment: post-procedure vital signs reviewed and stable Respiratory status: spontaneous breathing, nonlabored ventilation and respiratory function stable Cardiovascular status: blood pressure returned to baseline and stable Postop Assessment: no apparent nausea or vomiting Anesthetic complications: no   No complications documented.  Last Vitals:  Vitals:   07/05/20 0900 07/05/20 0915  BP: (!) 147/91 (!) 158/87  Pulse: 82 79  Resp: 16 15  Temp:    SpO2: 100% 100%    Last Pain:  Vitals:   07/05/20 0915  TempSrc:   PainSc: 5                  Manha Amato A.

## 2020-07-05 NOTE — Anesthesia Procedure Notes (Signed)
Procedure Name: LMA Insertion Date/Time: 07/05/2020 7:32 AM Performed by: Lucinda Dell, CRNA Pre-anesthesia Checklist: Patient identified, Emergency Drugs available, Suction available and Patient being monitored Patient Re-evaluated:Patient Re-evaluated prior to induction Oxygen Delivery Method: Circle system utilized Preoxygenation: Pre-oxygenation with 100% oxygen Induction Type: IV induction Ventilation: Mask ventilation without difficulty LMA: LMA inserted LMA Size: 4.0 Number of attempts: 1 Placement Confirmation: positive ETCO2 and breath sounds checked- equal and bilateral Tube secured with: Tape Dental Injury: Teeth and Oropharynx as per pre-operative assessment

## 2020-07-05 NOTE — H&P (Signed)
OB/GYN Pre-Op History and Physical  Holly [name] is a 44 y.o. 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 No complaints today.  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 Depression   . Eczema    scalp  . Family history of adverse reaction to anesthesia    mother struggles with sob, slow to awaken, n/v bedridden for days  . Fibromyalgia 2018  . GERD (gastroesophageal reflux disease)   . Hemiplegic migraine   . IBS (irritable bowel syndrome)   . Migraine   . MRSA (methicillin resistant staph aureus) culture positive not sure when  . Vaginal Pap smear, abnormal     Past Surgical History:  Procedure Laterality Date  . COLPOSCOPY     x 3 with biopsy on 1 done  . LEEP  2020  . MULTIPLE TOOTH EXTRACTIONS     as child  . WRIST SURGERY Left 2010   cyst removal    OB History  Gravida Para Term Preterm AB Living  6 6 2 4   6   SAB TAB Ectopic Multiple Live Births        0 6    # Outcome Date GA Lbr Len/2nd Weight Sex Delivery Anes PTL Lv  6 Preterm 07/07/15 [redacted]w[redacted]d 62:46 2880 g F Vag-Spont None  LIV  5 Term 02/2012 [redacted]w[redacted]d  3204 g M Vag-Spont EPI  LIV  4 Term 09/2008 [redacted]w[redacted]d  3430 g F Vag-Spont Local  LIV  3 Preterm 2006 [redacted]w[redacted]d  3459 g F Vag-Spont None  LIV  2 Preterm 2004 [redacted]w[redacted]d  3033 g M Vag-Spont   LIV  1 Preterm 2001 [redacted]w[redacted]d  2807 g M Vag-Spont   LIV    Social History   Socioeconomic History  . Marital status: Married    Spouse name: [id number]  . Number of children: 6  . Years of education: 35  . Highest education level: Some college, no degree  Occupational History  . Occupation: Home Maker  Tobacco Use  . Smoking status: Never Smoker  . Smokeless tobacco: Never Used  Vaping Use  . Vaping Use: Never used  Substance and Sexual Activity  . Alcohol use: No    Alcohol/week: 0.0 standard drinks     Comment: none  . Drug use: Never  . Sexual activity: Yes    Birth control/protection: Pill  Other Topics Concern  . Not on file  Social History Narrative   1 caffeine drink a day at max   Right handed   Lives at home with children. Husband is a 18 and rarely home.   Has a lot of migraines   Social Determinants of Health   Financial Resource Strain:   . Difficulty of Paying Living Expenses:   Food Insecurity:   . Worried About Naval architect in the Last Year:   . Programme researcher, broadcasting/film/video in the Last Year:   Transportation Needs:   . Barista (Medical):   Freight forwarder Lack of Transportation (Non-Medical):   Physical Activity:   . Days of Exercise per Week:   . Minutes of Exercise per Session:   Stress:   . Feeling of Stress :   Social Connections:   . Frequency of Communication with Friends and Family:   .  Frequency of Social Gatherings with Friends and Family:   . Attends Religious Services:   . Active Member of Clubs or Organizations:   . Attends Banker Meetings:   Marland Kitchen Marital Status:     Family History  Problem Relation Age of Onset  . Thyroid disease Mother   . Scoliosis Mother   . Depression Mother   . Migraines Mother   . Hyperlipidemia Mother   . Hypertension Mother   . Heart attack Mother   . Crohn's disease Father   . Autoimmune disease Father   . Factor V Leiden deficiency Father   . Heart failure Father   . Diabetes Father   . Hyperlipidemia Father   . Heart attack Father   . Cancer Sister   . Depression Sister   . Diabetes Sister   . Diabetes Paternal Grandfather     Medications Prior to Admission  Medication Sig Dispense Refill Last Dose  . ALLERGY RELIEF/NASAL DECONGEST 10-240 MG 24 hr tablet TAKE ONE TABLET BY MOUTH DAILY 30 tablet 11 Past Week at Unknown time  . B-D 3CC LUER-LOK SYR 23GX1" 23G X 1" 3 ML MISC Attach to syringe to draw up and administer Toradol into the muscle. 4 each 0 Past Week at Unknown time  .  butalbital-acetaminophen-caffeine (FIORICET) 50-325-40 MG tablet Take 1-2 tablets by mouth every 6 (six) hours as needed for headache. 15 tablet 5 Past Week at Unknown time  . cholecalciferol (VITAMIN D3) 25 MCG (1000 UNIT) tablet Take 2,000 Units by mouth daily.   07/04/2020 at Unknown time  . FLUoxetine (PROZAC) 10 MG capsule Take 1 capsule (10 mg total) by mouth daily. Take with 20 mg capsules for total daily dose 30 mg 90 capsule 3 07/04/2020 at Unknown time  . FLUoxetine (PROZAC) 20 MG capsule Take 1 capsule (20 mg total) by mouth daily. 90 capsule 3 07/04/2020 at Unknown time  . fluticasone (FLONASE) 50 MCG/ACT nasal spray Place 2 sprays into both nostrils daily. 16 g 1 Past Month at Unknown time  . Galcanezumab-gnlm (EMGALITY) 120 MG/ML SOAJ Inject 120 mg into the skin every 30 (thirty) days. 1 pen 11 Past Month at Unknown time  . HYDROcodone-acetaminophen (NORCO/VICODIN) 5-325 MG tablet Take 1 tablet by mouth every 6 (six) hours as needed for moderate pain. 28 tablet 0 Past Month at Unknown time  . ibuprofen (ADVIL) 800 MG tablet Take 1 tablet (800 mg total) by mouth every 8 (eight) hours as needed. 90 tablet 0 Past Month at Unknown time  . ketorolac (TORADOL) 60 MG/2ML SOLN injection INJECT 60 MG(2 ML) INTO THE MUSCLE EVERY 6 HOURS AS NEEDED FOR MIGRAINE. MAXIMUM DAILY DOSE IS 120 MG. DO NOT USE MORE THAN 4 DAYS IN A MONTH. 8 mL 3 Past Week at Unknown time  . methylPREDNISolone (MEDROL DOSEPAK) 4 MG TBPK tablet follow package directions (Patient taking differently: as needed. follow package directions) 21 tablet 1 Past Week at Unknown time  . montelukast (SINGULAIR) 10 MG tablet Take 1 tablet (10 mg) by mouth daily in the evening 90 tablet 3 07/04/2020 at Unknown time  . NEEDLE, DISP, 23 G 23G X 1" MISC Attach to syringe to draw up and administer Toradol into the muscle. 4 each 0 Past Week at Unknown time  . norethindrone (MICRONOR) 0.35 MG tablet Take 1 tablet (0.35 mg total) by mouth daily.  (Patient taking differently: Take 1 tablet by mouth at bedtime. ) 84 tablet 3 07/04/2020 at Unknown time  . omeprazole (  PRILOSEC) 40 MG capsule Take 1 capsule (40 mg total) by mouth daily. (Patient taking differently: Take 40 mg by mouth at bedtime. ) 90 capsule 3 07/04/2020 at Unknown time  . ondansetron (ZOFRAN-ODT) 4 MG disintegrating tablet Take 1 tablet (4 mg total) by mouth every 8 (eight) hours as needed for nausea. 30 tablet 11 Past Week at Unknown time  . Syringe, Disposable, (B-D SYRINGE LUER-LOK 3CC) 3 ML MISC Attach to needle to draw up and administer Toradol into the muscle. 4 each 0 Past Week at Unknown time  . Topiramate ER (TROKENDI XR) 100 MG CP24 Take 1 capsule by mouth at bedtime. 30 capsule 2 07/04/2020 at Unknown time  . prochlorperazine (COMPAZINE) 10 MG tablet Take 1 tablet (10 mg total) by mouth every 6 (six) hours as needed. 30 tablet 11 More than a month at Unknown time  . promethazine (PHENERGAN) 25 MG suppository Place 1 suppository (25 mg total) rectally every 6 (six) hours as needed for nausea or vomiting. 12 each 6 More than a month at Unknown time  . triamcinolone cream (KENALOG) 0.1 % Apply 1 application topically 2 (two) times daily. To affected area(s) as needed 45 g 1 More than a month at Unknown time    Allergies  Allergen Reactions  . Other Rash    Overly sleepy zyrtec  . Terbutaline Palpitations  . Lidocaine     Does not numb  . Sumatriptan   . Adhesive [Tape] Itching and Rash  . Aimovig [Erenumab-Aooe] Rash  . Celebrex [Celecoxib] Rash    03/17/18 patient is unsure, thinks it may have been shaving cream  . Cetirizine Hcl Other (See Comments)    Overly sleepy   . Ciprofloxacin Rash    Swelling of knees    Review of Systems: Negative except for what is mentioned in HPI.     Physical Exam: BP 136/87   Pulse 91   Temp 98.5 F (36.9 C) (Oral)   Resp 16   Ht 5\' 7"  (1.702 m)   Wt 75.4 kg   LMP 04/22/2020   SpO2 100%   BMI 26.03 kg/m    CONSTITUTIONAL: Well-developed, well-nourished female in no acute distress.  HENT:  Normocephalic, atraumatic, External right and left ear normal. Oropharynx is clear and moist EYES: Conjunctivae and EOM are normal. Pupils are equal, round, and reactive to light. No scleral icterus.  NECK: Normal range of motion, supple, no masses SKIN: Skin is warm and dry. No rash noted. Not diaphoretic. No erythema. No pallor. NEUROLGIC: Alert and oriented to person, place, and time. Normal reflexes, muscle tone coordination. No cranial nerve deficit noted. PSYCHIATRIC: Normal mood and affect. Normal behavior. Normal judgment and thought content. CARDIOVASCULAR: Normal heart rate noted RESPIRATORY: Effort normal, no problems with respiration noted ABDOMEN: Soft, nondistended, gravid.  PELVIC: Deferred MUSCULOSKELETAL: Normal range of motion. No edema and no tenderness. 2+ distal pulses.   Pertinent Labs/Studies:   Results for orders placed or performed during the hospital encounter of 07/05/20 (from the past 72 hour(s))  Pregnancy, urine POC     Status: None   Collection Time: 07/05/20  5:41 AM  Result Value Ref Range   Preg Test, Ur NEGATIVE NEGATIVE    Comment:        THE SENSITIVITY OF THIS METHODOLOGY IS >24 mIU/mL   I-STAT, chem 8     Status: None   Collection Time: 07/05/20  6:33 AM  Result Value Ref Range   Sodium 142 135 - 145 mmol/L  Potassium 3.9 3.5 - 5.1 mmol/L   Chloride 105 98 - 111 mmol/L   BUN 10 6 - 20 mg/dL   Creatinine, Ser 8.32 0.44 - 1.00 mg/dL   Glucose, Bld 95 70 - 99 mg/dL    Comment: Glucose reference range applies only to samples taken after fasting for at least 8 hours.   Calcium, Ion 1.22 1.15 - 1.40 mmol/L   TCO2 23 22 - 32 mmol/L   Hemoglobin 12.2 12.0 - 15.0 g/dL   HCT 54.9 36 - 46 %       Assessment and Plan :Terrianna A Tineo is a 44 y.o. [id number] here for cold knife conization. H/o LEEP. Reviewed risks/benefits of cold knife conization including risk of  infection, hemorrhage, damage to surrounding tissue and organs. Risks of positive margins, risk of final pathology being better/worse than biopsy. Patient agreeable to blood transfusion in the event of hemorrhage. Consent signed. Reviewed post op instructions.  Of note, patient has allergy to lidocaine listed. She states she has needed increased amounts of medication at the dentist, does not have itching/swelling with lidocaine. Had lidocaine with LEEP procedure with no issues. She is comfortable with me using lidocaine for this procedure.    Plan for cold knife conization NPO Admission labs ordered VS per routine No antibiotics indicated   K. Therese Sarah, M.D. Attending Obstetrician & Gynecologist, [facility] for [employer], [facility]

## 2020-07-06 ENCOUNTER — Encounter (HOSPITAL_BASED_OUTPATIENT_CLINIC_OR_DEPARTMENT_OTHER): Payer: Self-pay | Admitting: Obstetrics and Gynecology

## 2020-07-06 LAB — SURGICAL PATHOLOGY

## 2020-07-21 ENCOUNTER — Ambulatory Visit (INDEPENDENT_AMBULATORY_CARE_PROVIDER_SITE_OTHER): Payer: Medicare Other | Admitting: Obstetrics and Gynecology

## 2020-07-21 ENCOUNTER — Telehealth: Payer: Self-pay

## 2020-07-21 ENCOUNTER — Encounter: Payer: Self-pay | Admitting: Obstetrics and Gynecology

## 2020-07-21 ENCOUNTER — Other Ambulatory Visit: Payer: Self-pay

## 2020-07-21 ENCOUNTER — Other Ambulatory Visit (HOSPITAL_COMMUNITY)
Admission: RE | Admit: 2020-07-21 | Discharge: 2020-07-21 | Disposition: A | Discharge status: Home / Self Care | Payer: Medicare Other | Source: Ambulatory Visit | Attending: Obstetrics and Gynecology | Admitting: Obstetrics and Gynecology

## 2020-07-21 VITALS — BP 112/70 | HR 98 | Ht 67.0 in | Wt 168.0 lb

## 2020-07-21 DIAGNOSIS — R112 Nausea with vomiting, unspecified: Secondary | ICD-10-CM

## 2020-07-21 DIAGNOSIS — N898 Other specified noninflammatory disorders of vagina: Secondary | ICD-10-CM

## 2020-07-21 DIAGNOSIS — Z9889 Other specified postprocedural states: Secondary | ICD-10-CM

## 2020-07-21 NOTE — Telephone Encounter (Signed)
Called pt to see if she received voicemail from Mariel Aloe, RN yesterday about making her an appointment for 9/2 at 1:00 to follow up on issues from surgery with Dr Earlene Plater. Pt's phone rang once and then sent me to voicemail. Voicemail left asking pt to return call to office to let us know if today's appt date and time worked for her. MyChart message also sent.

## 2020-07-21 NOTE — Progress Notes (Signed)
GYNECOLOGY OFFICE FOLLOW UP NOTE  History:  44 y.o. [id number] here today for follow up for had some bleeding after severe vomiting and migraine earlier this week, was spotting, not significant bleeding. Also with a vaginal odor, some discharge.     Past Medical History:  Diagnosis Date  . Depression   . Eczema    scalp  . Family history of adverse reaction to anesthesia    mother struggles with sob, slow to awaken, n/v bedridden for days  . Fibromyalgia 2018  . GERD (gastroesophageal reflux disease)   . Hemiplegic migraine   . IBS (irritable bowel syndrome)   . Migraine   . MRSA (methicillin resistant staph aureus) culture positive not sure when  . Vaginal Pap smear, abnormal     Past Surgical History:  Procedure Laterality Date  . CERVICAL CONIZATION W/BX N/A 07/05/2020   Procedure: COLD KNIFE CONIZATION CERVIX WITH BIOPSY; COLPOSCOPY AND ENDOCERVICAL CURRETAGE;  Surgeon: Conan Bowens, MD;  Location: [facility];  Service: Gynecology;  Laterality: N/A;  . COLPOSCOPY     x 3 with biopsy on 1 done  . LEEP  2020  . MULTIPLE TOOTH EXTRACTIONS     as child  . WRIST SURGERY Left 2010   cyst removal     Current Outpatient Medications:  .  ALLERGY RELIEF/NASAL DECONGEST 10-240 MG 24 hr tablet, TAKE ONE TABLET BY MOUTH DAILY, Disp: 30 tablet, Rfl: 11 .  B-D 3CC LUER-LOK SYR 23GX1" 23G X 1" 3 ML MISC, Attach to syringe to draw up and administer Toradol into the muscle., Disp: 4 each, Rfl: 0 .  butalbital-acetaminophen-caffeine (FIORICET) 50-325-40 MG tablet, Take 1-2 tablets by mouth every 6 (six) hours as needed for headache., Disp: 15 tablet, Rfl: 5 .  cholecalciferol (VITAMIN D3) 25 MCG (1000 UNIT) tablet, Take 2,000 Units by mouth daily., Disp: , Rfl:  .  FLUoxetine (PROZAC) 10 MG capsule, Take 1 capsule (10 mg total) by mouth daily. Take with 20 mg capsules for total daily dose 30 mg, Disp: 90 capsule, Rfl: 3 .  FLUoxetine (PROZAC) 20 MG capsule, Take 1 capsule  (20 mg total) by mouth daily., Disp: 90 capsule, Rfl: 3 .  fluticasone (FLONASE) 50 MCG/ACT nasal spray, Place 2 sprays into both nostrils daily., Disp: 16 g, Rfl: 1 .  Galcanezumab-gnlm (EMGALITY) 120 MG/ML SOAJ, Inject 120 mg into the skin every 30 (thirty) days., Disp: 1 pen, Rfl: 11 .  ibuprofen (ADVIL) 800 MG tablet, Take 1 tablet (800 mg total) by mouth every 8 (eight) hours as needed., Disp: 90 tablet, Rfl: 0 .  ibuprofen (ADVIL) 800 MG tablet, Take 1 tablet (800 mg total) by mouth 3 (three) times daily with meals as needed for headache, moderate pain or cramping., Disp: 30 tablet, Rfl: 1 .  ketorolac (TORADOL) 60 MG/2ML SOLN injection, INJECT 60 MG(2 ML) INTO THE MUSCLE EVERY 6 HOURS AS NEEDED FOR MIGRAINE. MAXIMUM DAILY DOSE IS 120 MG. DO NOT USE MORE THAN 4 DAYS IN A MONTH., Disp: 8 mL, Rfl: 3 .  methylPREDNISolone (MEDROL DOSEPAK) 4 MG TBPK tablet, follow package directions (Patient taking differently: as needed. follow package directions), Disp: 21 tablet, Rfl: 1 .  montelukast (SINGULAIR) 10 MG tablet, Take 1 tablet (10 mg) by mouth daily in the evening, Disp: 90 tablet, Rfl: 3 .  NEEDLE, DISP, 23 G 23G X 1" MISC, Attach to syringe to draw up and administer Toradol into the muscle., Disp: 4 each, Rfl: 0 .  norethindrone (  MICRONOR) 0.35 MG tablet, Take 1 tablet (0.35 mg total) by mouth daily. (Patient taking differently: Take 1 tablet by mouth at bedtime. ), Disp: 84 tablet, Rfl: 3 .  omeprazole (PRILOSEC) 40 MG capsule, Take 1 capsule (40 mg total) by mouth daily. (Patient taking differently: Take 40 mg by mouth at bedtime. ), Disp: 90 capsule, Rfl: 3 .  ondansetron (ZOFRAN-ODT) 4 MG disintegrating tablet, Take 1 tablet (4 mg total) by mouth every 8 (eight) hours as needed for nausea., Disp: 30 tablet, Rfl: 11 .  prochlorperazine (COMPAZINE) 10 MG tablet, Take 1 tablet (10 mg total) by mouth every 6 (six) hours as needed., Disp: 30 tablet, Rfl: 11 .  promethazine (PHENERGAN) 25 MG  suppository, Place 1 suppository (25 mg total) rectally every 6 (six) hours as needed for nausea or vomiting., Disp: 12 each, Rfl: 6 .  Syringe, Disposable, (B-D SYRINGE LUER-LOK 3CC) 3 ML MISC, Attach to needle to draw up and administer Toradol into the muscle., Disp: 4 each, Rfl: 0 .  Topiramate ER (TROKENDI XR) 100 MG CP24, Take 1 capsule by mouth at bedtime., Disp: 30 capsule, Rfl: 2 .  triamcinolone cream (KENALOG) 0.1 %, Apply 1 application topically 2 (two) times daily. To affected area(s) as needed, Disp: 45 g, Rfl: 1 .  HYDROcodone-acetaminophen (NORCO/VICODIN) 5-325 MG tablet, Take 1 tablet by mouth every 6 (six) hours as needed for moderate pain. (Patient not taking: Reported on 07/21/2020), Disp: 28 tablet, Rfl: 0 .  oxyCODONE (OXY IR/ROXICODONE) 5 MG immediate release tablet, Take 1 tablet (5 mg total) by mouth every 4 (four) hours as needed for severe pain. (Patient not taking: Reported on 07/21/2020), Disp: 30 tablet, Rfl: 0  The following portions of the patient's history were reviewed and updated as appropriate: allergies, current medications, past family history, past medical history, past social history, past surgical history and problem list.   Review of Systems:  Pertinent items noted in HPI and remainder of comprehensive ROS otherwise negative.   Objective:  Physical Exam BP 112/70   Pulse 98   Ht 5\' 7"  (1.702 m)   Wt 168 lb (76.2 kg)   BMI 26.31 kg/m  CONSTITUTIONAL: Well-developed, well-nourished female in no acute distress.  HENT:  Normocephalic, atraumatic. External right and left ear normal. Oropharynx is clear and moist EYES: Conjunctivae and EOM are normal. Pupils are equal, round, and reactive to light. No scleral icterus.  NECK: Normal range of motion, supple, no masses SKIN: Skin is warm and dry. No rash noted. Not diaphoretic. No erythema. No pallor. NEUROLOGIC: Alert and oriented to person, place, and time. Normal reflexes, muscle tone coordination. No cranial  nerve deficit noted. PSYCHIATRIC: Normal mood and affect. Normal behavior. Normal judgment and thought content. CARDIOVASCULAR: Normal heart rate noted RESPIRATORY: Effort normal, no problems with respiration noted ABDOMEN: Soft, no distention noted.   PELVIC: Normal appearing external genitalia; normal appearing vaginal mucosa. Cervix appears to be healing well, No abnormal discharge noted.  Suture cut and removed, pelvic cultures obtained.  MUSCULOSKELETAL: Normal range of motion. No edema noted.  Exam done with chaperone present.  Labs and Imaging No results found.  Assessment & Plan:   1. Nausea and vomiting, intractability of vomiting not specified, unspecified vomiting type - Cervicovaginal ancillary only( [place])  2. Postoperative state Healing well  3. Vaginal discharge swab  4. Vaginal odor  5. History of conization of cervix   Routine preventative health maintenance measures emphasized. Please refer to After Visit Summary for other  counseling recommendations.   Return in about 6 months (around 01/18/2021) for pap, Followup.  Total face-to-face time with patient: 15 minutes. Over 50% of encounter was spent on counseling and coordination of care.  Baldemar Lenis, M.D. Attending Center for [employer] [facility]wife)

## 2020-07-22 LAB — CERVICOVAGINAL ANCILLARY ONLY
Bacterial Vaginitis (gardnerella): NEGATIVE
Candida Glabrata: NEGATIVE
Candida Vaginitis: NEGATIVE
Comment: NEGATIVE
Comment: NEGATIVE
Comment: NEGATIVE

## 2020-07-28 ENCOUNTER — Encounter: Payer: Medicare Other | Admitting: Obstetrics and Gynecology

## 2020-08-03 ENCOUNTER — Encounter: Payer: Self-pay | Admitting: Osteopathic Medicine

## 2020-08-03 ENCOUNTER — Ambulatory Visit (INDEPENDENT_AMBULATORY_CARE_PROVIDER_SITE_OTHER): Payer: Medicare Other | Admitting: Osteopathic Medicine

## 2020-08-03 VITALS — BP 131/90 | HR 103 | Wt 167.0 lb

## 2020-08-03 DIAGNOSIS — M797 Fibromyalgia: Secondary | ICD-10-CM

## 2020-08-03 DIAGNOSIS — F339 Major depressive disorder, recurrent, unspecified: Secondary | ICD-10-CM

## 2020-08-03 DIAGNOSIS — E559 Vitamin D deficiency, unspecified: Secondary | ICD-10-CM

## 2020-08-03 DIAGNOSIS — G43409 Hemiplegic migraine, not intractable, without status migrainosus: Secondary | ICD-10-CM

## 2020-08-03 DIAGNOSIS — N921 Excessive and frequent menstruation with irregular cycle: Secondary | ICD-10-CM

## 2020-08-03 MED ORDER — IBUPROFEN 800 MG PO TABS: 800.0000 mg | ORAL_TABLET | Freq: Three times a day (TID) | ORAL | 1 refills | Status: DC | PRN

## 2020-08-03 MED ORDER — NORETHINDRONE 0.35 MG PO TABS: 1.0000 | ORAL_TABLET | Freq: Every day | ORAL | 3 refills | Status: DC

## 2020-08-03 MED ORDER — FLUOXETINE HCL 10 MG PO CAPS: 10.0000 mg | ORAL_CAPSULE | Freq: Every day | ORAL | 3 refills | Status: DC

## 2020-08-03 MED ORDER — FLUOXETINE HCL 20 MG PO CAPS: 20.0000 mg | ORAL_CAPSULE | Freq: Every day | ORAL | 3 refills | Status: DC

## 2020-08-03 NOTE — Progress Notes (Signed)
Holly [name] is a 44 y.o. female who presents to  [facility] at [facility]  today, 08/03/20, seeking care for the following:  . Routine check-up chronic conditions.     ASSESSMENT & PLAN with other pertinent findings:  The primary encounter diagnosis was Depression, recurrent (HCC). Diagnoses of Hemiplegic migraine without status migrainosus, not intractable, Fibromyalgia syndrome, Vitamin D deficiency, and Menometrorrhagia were also pertinent to this visit.   1. Hemiplegic migraine without status migrainosus, not intractable Following with neurology, very occasional opiate use  2. Depression, recurrent (HCC) Reports stable, controlled on current medications Some anxiety/racing thoughts worse at bedtime, some insomnia Refills sent OK to refill clonazepam if/when needed   3. Fibromyalgia syndrome Controlled / stable   4. Vitamin D deficiency Taking OTC   5. Menometrorrhagia Following with OBGYN         No results found for this or any previous visit (from the past 24 hour(s)).  Depression screen [place] 2/9 08/03/2020 11/18/2019 09/22/2019  Decreased Interest 0 1 0  Down, Depressed, Hopeless 1 0 0  PHQ - 2 Score 1 1 0  Altered sleeping 3 2 -  Tired, decreased energy 2 2 -  Change in appetite 1 0 -  Feeling bad or failure about yourself  0 0 -  Trouble concentrating 0 0 -  Moving slowly or fidgety/restless 0 1 -  Suicidal thoughts 0 0 -  PHQ-9 Score 7 6 -  Difficult doing work/chores - Somewhat difficult -   GAD 7 : Generalized Anxiety Score 08/03/2020 11/18/2019 09/22/2019 12/12/2018  Nervous, Anxious, on Edge 0 0 0 0  Control/stop worrying 0 0 0 1  Worry too much - different things 0 0 0 1  Trouble relaxing 3 2 1 2   Restless 0 0 0 1  Easily annoyed or irritable 0 2 1 2   Afraid - awful might happen 0 0 0 0  Total GAD 7 Score 3 4 2 7   Anxiety Difficulty - Somewhat difficult Somewhat difficult Somewhat difficult        There are no Patient Instructions on file for this visit.  No orders of the defined types were placed in this encounter.   Meds ordered this encounter  Medications  . FLUoxetine (PROZAC) 10 MG capsule    Sig: Take 1 capsule (10 mg total) by mouth daily. Take with 20 mg capsules for total daily dose 30 mg    Dispense:  90 capsule    Refill:  3  . FLUoxetine (PROZAC) 20 MG capsule    Sig: Take 1 capsule (20 mg total) by mouth daily.    Dispense:  90 capsule    Refill:  3  . norethindrone (MICRONOR) 0.35 MG tablet    Sig: Take 1 tablet (0.35 mg total) by mouth daily.    Dispense:  84 tablet    Refill:  3  . ibuprofen (ADVIL) 800 MG tablet    Sig: Take 1 tablet (800 mg total) by mouth 3 (three) times daily with meals as needed for headache, moderate pain or cramping.    Dispense:  90 tablet    Refill:  1       Follow-up instructions: Return in about 6 months (around 01/31/2021) for ANNUAL (call week prior to visit for lab orders).  BP 131/90 (BP Location: Left Arm, Patient Position: Sitting)   Pulse (!) 103   Wt 167 lb (75.8 kg)   SpO2 100%   BMI 26.16 kg/m   Current Meds  Medication Sig  . ALLERGY RELIEF/NASAL DECONGEST 10-240 MG 24 hr tablet TAKE ONE TABLET BY MOUTH DAILY  . B-D 3CC LUER-LOK SYR 23GX1" 23G X 1" 3 ML MISC Attach to syringe to draw up and administer Toradol into the muscle.  . butalbital-acetaminophen-caffeine (FIORICET) 50-325-40 MG tablet Take 1-2 tablets by mouth every 6 (six) hours as needed for headache.  Marland Kitchen FLUoxetine (PROZAC) 10 MG capsule Take 1 capsule (10 mg total) by mouth daily. Take with 20 mg capsules for total daily dose 30 mg  . FLUoxetine (PROZAC) 20 MG capsule Take 1 capsule (20 mg total) by mouth daily.  . fluticasone (FLONASE) 50 MCG/ACT nasal spray Place 2 sprays into both nostrils daily.  . Galcanezumab-gnlm (EMGALITY) 120 MG/ML SOAJ Inject 120 mg  into the skin every 30 (thirty) days.  Marland Kitchen HYDROcodone-acetaminophen (NORCO/VICODIN) 5-325 MG tablet Take 1 tablet by mouth every 6 (six) hours as needed for moderate pain.  Marland Kitchen ibuprofen (ADVIL) 800 MG tablet Take 1 tablet (800 mg total) by mouth every 8 (eight) hours as needed.  Marland Kitchen ibuprofen (ADVIL) 800 MG tablet Take 1 tablet (800 mg total) by mouth 3 (three) times daily with meals as needed for headache, moderate pain or cramping.  Marland Kitchen ketorolac (TORADOL) 60 MG/2ML SOLN injection INJECT 60 MG(2 ML) INTO THE MUSCLE EVERY 6 HOURS AS NEEDED FOR MIGRAINE. MAXIMUM DAILY DOSE IS 120 MG. DO NOT USE MORE THAN 4 DAYS IN A MONTH.  . methylPREDNISolone (MEDROL DOSEPAK) 4 MG TBPK tablet follow package directions (Patient taking differently: as needed. follow package directions)  . montelukast (SINGULAIR) 10 MG tablet Take 1 tablet (10 mg) by mouth daily in the evening  . NEEDLE, DISP, 23 G 23G X 1" MISC Attach to syringe to draw up and administer Toradol into the muscle.  . norethindrone (MICRONOR) 0.35 MG tablet Take 1 tablet (0.35 mg total) by mouth daily. (Patient taking differently: Take 1 tablet by mouth at bedtime. )  . omeprazole (PRILOSEC) 40 MG capsule Take 1 capsule (40 mg total) by mouth daily. (Patient taking differently: Take 40 mg by mouth at bedtime. )  . ondansetron (ZOFRAN-ODT) 4 MG disintegrating tablet Take 1 tablet (4 mg total) by mouth every 8 (eight) hours as needed for nausea.  . prochlorperazine (COMPAZINE) 10 MG tablet Take 1 tablet (10 mg total) by mouth every 6 (six) hours as needed.  . promethazine (PHENERGAN) 25 MG suppository Place 1 suppository (25 mg total) rectally every 6 (six) hours as needed for nausea or vomiting.  . Syringe, Disposable, (B-D SYRINGE LUER-LOK 3CC) 3 ML MISC Attach to needle to draw up and administer Toradol into the muscle.  . Topiramate ER (TROKENDI XR) 100 MG CP24 Take 1 capsule by mouth at bedtime.  . triamcinolone cream (KENALOG) 0.1 % Apply 1 application  topically 2 (two) times daily. To affected area(s) as needed    No results found for this or any previous visit (from the past 72 hour(s)).  No results found.     All questions at time of visit were answered - patient instructed to contact office with any additional concerns or updates.  ER/RTC precautions were reviewed with the patient as applicable.   Please note: voice recognition software was used to produce this document, and typos may escape review. Please  contact Dr. Lyn Hollingshead for any needed clarifications.   Total encounter time: 20 minutes.

## 2020-08-09 ENCOUNTER — Other Ambulatory Visit: Payer: Self-pay | Admitting: Neurology

## 2020-08-09 ENCOUNTER — Telehealth: Payer: Self-pay | Admitting: *Deleted

## 2020-08-09 NOTE — Telephone Encounter (Signed)
I called the pt. She stated at the last botox appt she discussed with Dr Lucia Gaskins having a standing steroid dose pack refill for use if needed. She used the last two refills (per pharmacy, 05/24/20 and 07/02/20). Pt doesn't immediately need the refill but would like to have one on hand if needed.

## 2020-08-09 NOTE — Telephone Encounter (Signed)
We received a refill request from [facility] for Prednisone 20 mg originally prescribed by Dr Donna Christen. Rx denied and message sent back to pharmacy stating pt should contact prescriber first. She was recently prescribed a Medrol dose pack. Received a receipt of confirmation.

## 2020-08-15 ENCOUNTER — Other Ambulatory Visit: Payer: Self-pay | Admitting: Neurology

## 2020-08-15 DIAGNOSIS — G43411 Hemiplegic migraine, intractable, with status migrainosus: Secondary | ICD-10-CM

## 2020-08-15 DIAGNOSIS — G43711 Chronic migraine without aura, intractable, with status migrainosus: Secondary | ICD-10-CM

## 2020-09-06 ENCOUNTER — Ambulatory Visit: Payer: Medicare Other | Admitting: Neurology

## 2020-09-06 ENCOUNTER — Encounter: Payer: Self-pay | Admitting: *Deleted

## 2020-09-06 ENCOUNTER — Ambulatory Visit: Payer: Medicare Other | Admitting: Family Medicine

## 2020-09-14 ENCOUNTER — Ambulatory Visit (INDEPENDENT_AMBULATORY_CARE_PROVIDER_SITE_OTHER): Payer: Medicare Other | Admitting: Neurology

## 2020-09-14 ENCOUNTER — Encounter: Payer: Self-pay | Admitting: Neurology

## 2020-09-14 DIAGNOSIS — G43711 Chronic migraine without aura, intractable, with status migrainosus: Secondary | ICD-10-CM | POA: Diagnosis not present

## 2020-09-14 DIAGNOSIS — G43411 Hemiplegic migraine, intractable, with status migrainosus: Secondary | ICD-10-CM

## 2020-09-14 MED ORDER — PROCHLORPERAZINE MALEATE 10 MG PO TABS: 10.0000 mg | ORAL_TABLET | Freq: Four times a day (QID) | ORAL | 11 refills | Status: DC | PRN

## 2020-09-14 MED ORDER — EMGALITY 120 MG/ML ~~LOC~~ SOAJ: 120.0000 mg | SUBCUTANEOUS | 6 refills | Status: DC

## 2020-09-14 MED ORDER — BUTALBITAL-APAP-CAFFEINE 50-325-40 MG PO TABS: 1.0000 | ORAL_TABLET | Freq: Four times a day (QID) | ORAL | 5 refills | Status: DC | PRN

## 2020-09-14 MED ORDER — TROKENDI XR 100 MG PO CP24: 1.0000 | ORAL_CAPSULE | Freq: Every day | ORAL | 3 refills | Status: DC

## 2020-09-14 MED ORDER — HYDROCODONE-ACETAMINOPHEN 5-325 MG PO TABS: 1.0000 | ORAL_TABLET | Freq: Four times a day (QID) | ORAL | 0 refills | Status: DC | PRN

## 2020-09-14 MED ORDER — KETOROLAC TROMETHAMINE 60 MG/2ML IM SOLN: INTRAMUSCULAR | 3 refills | Status: DC

## 2020-09-14 MED ORDER — METHYLPREDNISOLONE 4 MG PO TBPK: ORAL_TABLET | ORAL | 1 refills | Status: DC

## 2020-09-14 NOTE — Progress Notes (Signed)
Consent Form Botulism Toxin Injection For Chronic Migraine  09/14/2020: Doing extremely well on botox > 55% improvement in frequency and severity of migraines and headaches per patient today.Ubrelvy caused stomach discomfort. Lasmiditan did not work. Try Nurtec. Continue Fioricet, only acute med that works. Did extra in the cervical paraspinals, also across the occipitalis in the back as well "around the ponytail". DO NOT DO ANY IN THE LS it made it worse just a little extra in the occipital area and in the cervical paraspinals and traps. Her son has migraines and is 102 we will see him next year when he is closer to 34.   PRIOR: Still doing very well, > 50% improvement in frequency of migraines.  Ubrelvy caused stomach discomfort. Lasmiditan did not work. Try Nurtec. Continue Fioricet, only acute med that works. Did extra in the cervical paraspinals, also across the occipitalis in the back as well "around the ponytail". Discussed Vicodin, she uses sparingly, ok to give.    Reviewed orally with patient, additionally signature is on file:Consent Form Botulism Toxin Injection For Chronic Migraine    Reviewed orally with patient, additionally signature is on file:  Botulism toxin has been approved by the Federal drug administration for treatment of chronic migraine. Botulism toxin does not cure chronic migraine and it may not be effective in some patients.  The administration of botulism toxin is accomplished by injecting a small amount of toxin into the muscles of the neck and head. Dosage must be titrated for each individual. Any benefits resulting from botulism toxin tend to wear off after 3 months with a repeat injection required if benefit is to be maintained. Injections are usually done every 3-4 months with maximum effect peak achieved by about 2 or 3 weeks. Botulism toxin is expensive and you should be sure of what costs you will incur resulting from the injection.  The side effects of  botulism toxin use for chronic migraine may include:   -Transient, and usually mild, facial weakness with facial injections  -Transient, and usually mild, head or neck weakness with head/neck injections  -Reduction or loss of forehead facial animation due to forehead muscle weakness  -Eyelid drooping  -Dry eye  -Pain at the site of injection or bruising at the site of injection  -Double vision  -Potential unknown long term risks  Contraindications: You should not have Botox if you are pregnant, nursing, allergic to albumin, have an infection, skin condition, or muscle weakness at the site of the injection, or have myasthenia gravis, Lambert-Eaton syndrome, or ALS.  It is also possible that as with any injection, there may be an allergic reaction or no effect from the medication. Reduced effectiveness after repeated injections is sometimes seen and rarely infection at the injection site may occur. All care will be taken to prevent these side effects. If therapy is given over a long time, atrophy and wasting in the muscle injected may occur. Occasionally the patient's become refractory to treatment because they develop antibodies to the toxin. In this event, therapy needs to be modified.  I have read the above information and consent to the administration of botulism toxin.    BOTOX PROCEDURE NOTE FOR MIGRAINE HEADACHE    Contraindications and precautions discussed with patient(above). Aseptic procedure was observed and patient tolerated procedure. Procedure performed by Dr. Artemio Aly  The condition has existed for more than 6 months, and pt does not have a diagnosis of ALS, Myasthenia Gravis or Lambert-Eaton Syndrome.  Risks and benefits  of injections discussed and pt agrees to proceed with the procedure.  Written consent obtained  These injections are medically necessary. Pt  receives good benefits from these injections. These injections do not cause sedations or hallucinations which the  oral therapies may cause.  Description of procedure:  The patient was placed in a sitting position. The standard protocol was used for Botox as follows, with 5 units of Botox injected at each site:   -Procerus muscle, midline injection  -Corrugator muscle, bilateral injection  -Frontalis muscle, bilateral injection, with 2 sites each side, medial injection was performed in the upper one third of the frontalis muscle, in the region vertical from the medial inferior edge of the superior orbital rim. The lateral injection was again in the upper one third of the forehead vertically above the lateral limbus of the cornea, 1.5 cm lateral to the medial injection site.  -Temporalis muscle injection, 4 sites, bilaterally. The first injection was 3 cm above the tragus of the ear, second injection site was 1.5 cm to 3 cm up from the first injection site in line with the tragus of the ear. The third injection site was 1.5-3 cm forward between the first 2 injection sites. The fourth injection site was 1.5 cm posterior to the second injection site.   -Occipitalis muscle injection, 3 sites, bilaterally. The first injection was done one half way between the occipital protuberance and the tip of the mastoid process behind the ear. The second injection site was done lateral and superior to the first, 1 fingerbreadth from the first injection. The third injection site was 1 fingerbreadth superiorly and medially from the first injection site.  -Cervical paraspinal muscle injection, 2 sites, bilateral knee first injection site was 1 cm from the midline of the cervical spine, 3 cm inferior to the lower border of the occipital protuberance. The second injection site was 1.5 cm superiorly and laterally to the first injection site.  -Trapezius muscle injection was performed at 3 sites, bilaterally. The first injection site was in the upper trapezius muscle halfway between the inflection point of the neck, and the acromion.  The second injection site was one half way between the acromion and the first injection site. The third injection was done between the first injection site and the inflection point of the neck.   Will return for repeat injection in 3 months.   200 units of Botox was used, any Botox not injected was wasted. The patient tolerated the procedure well, there were no complications of the above procedure.

## 2020-09-14 NOTE — Progress Notes (Signed)
Botox- 200 units x 1 vial Lot: [id number] Expiration: 04/2023 NDC: 0023-3921-02  Bacteriostatic 0.9% Sodium Chloride- 4mL total Lot: [id number] Expiration: 12/20/2021 NDC: 0409-1966-02  Dx: G43.711 B/B  

## 2020-09-29 ENCOUNTER — Other Ambulatory Visit: Payer: Self-pay | Admitting: Neurology

## 2020-10-30 ENCOUNTER — Other Ambulatory Visit: Payer: Self-pay | Admitting: Osteopathic Medicine

## 2020-10-30 DIAGNOSIS — J302 Other seasonal allergic rhinitis: Secondary | ICD-10-CM

## 2020-11-15 ENCOUNTER — Other Ambulatory Visit: Payer: Self-pay | Admitting: Osteopathic Medicine

## 2020-11-29 ENCOUNTER — Ambulatory Visit (INDEPENDENT_AMBULATORY_CARE_PROVIDER_SITE_OTHER): Payer: Medicare Other | Admitting: Sports Medicine

## 2020-11-29 ENCOUNTER — Other Ambulatory Visit: Payer: Self-pay

## 2020-11-29 ENCOUNTER — Ambulatory Visit (INDEPENDENT_AMBULATORY_CARE_PROVIDER_SITE_OTHER): Payer: Medicare Other

## 2020-11-29 DIAGNOSIS — M7989 Other specified soft tissue disorders: Secondary | ICD-10-CM | POA: Diagnosis not present

## 2020-11-29 DIAGNOSIS — M79672 Pain in left foot: Secondary | ICD-10-CM

## 2020-11-29 DIAGNOSIS — R2242 Localized swelling, mass and lump, left lower limb: Secondary | ICD-10-CM | POA: Diagnosis not present

## 2020-11-29 DIAGNOSIS — S99922A Unspecified injury of left foot, initial encounter: Secondary | ICD-10-CM

## 2020-11-29 DIAGNOSIS — W228XXA Striking against or struck by other objects, initial encounter: Secondary | ICD-10-CM | POA: Diagnosis not present

## 2020-11-29 NOTE — Assessment & Plan Note (Signed)
This is a pleasant 45 year old female with history of hemiplegic complex migraines, had an episode and bumped her foot into a few objects a week ago, she then noticed bruising, swelling, pain. On exam she has tenderness at the neck of the second and fourth metatarsals, adding x-rays, postop shoe, she is got pain medicine at home. Return to see me in 3 weeks.

## 2020-11-29 NOTE — Progress Notes (Signed)
    Procedures performed today:    None.  Independent interpretation of notes and tests performed by another provider:   None.  Brief History, Exam, Impression, and Recommendations:    Injury of foot, left This is a pleasant 45 year old female with history of hemiplegic complex migraines, had an episode and bumped her foot into a few objects a week ago, she then noticed bruising, swelling, pain. On exam she has tenderness at the neck of the second and fourth metatarsals, adding x-rays, postop shoe, she is got pain medicine at home. Return to see me in 3 weeks.    ___________________________________________ Ihor Austin. Benjamin Stain, M.D., ABFM., CAQSM. Primary Care and Sports Medicine [facility]  Adjunct Instructor of Family Medicine  [employer]

## 2020-12-19 ENCOUNTER — Other Ambulatory Visit: Payer: Self-pay | Admitting: Neurology

## 2020-12-20 ENCOUNTER — Ambulatory Visit (INDEPENDENT_AMBULATORY_CARE_PROVIDER_SITE_OTHER): Payer: Medicare Other | Admitting: Neurology

## 2020-12-20 ENCOUNTER — Other Ambulatory Visit: Payer: Self-pay

## 2020-12-20 DIAGNOSIS — R635 Abnormal weight gain: Secondary | ICD-10-CM

## 2020-12-20 DIAGNOSIS — G43411 Hemiplegic migraine, intractable, with status migrainosus: Secondary | ICD-10-CM

## 2020-12-20 DIAGNOSIS — L659 Nonscarring hair loss, unspecified: Secondary | ICD-10-CM

## 2020-12-20 DIAGNOSIS — R21 Rash and other nonspecific skin eruption: Secondary | ICD-10-CM

## 2020-12-20 DIAGNOSIS — Z862 Personal history of diseases of the blood and blood-forming organs and certain disorders involving the immune mechanism: Secondary | ICD-10-CM

## 2020-12-20 DIAGNOSIS — R5383 Other fatigue: Secondary | ICD-10-CM

## 2020-12-20 DIAGNOSIS — G43711 Chronic migraine without aura, intractable, with status migrainosus: Secondary | ICD-10-CM

## 2020-12-20 NOTE — Progress Notes (Signed)
Botox- 200 units x 1 vial Lot: [id number] Expiration: 08/2023 NDC: 0023-3921-02  Bacteriostatic 0.9% Sodium Chloride- 4mL total Lot: [id number] Expiration: 12/20/2021 NDC: 0409-1966-02  Dx: G43.711 B/B  

## 2020-12-20 NOTE — Progress Notes (Signed)
Consent Form Botulism Toxin Injection For Chronic Migraine    12/20/2020: Doing extremely well on botox > 55% improvement in frequency and severity of migraines and headaches per patient today.Ubrelvy caused stomach discomfort. Lasmiditan did not work. Try Nurtec. Continue Fioricet, only acute med that works. Did extra in the cervical paraspinals, also across the occipitalis in the back as well "around the ponytail". DO NOT DO ANY IN THE LS it made it worse just a little extra in the occipital area and in the cervical paraspinals and traps. Her son has migraines and is 47 we will see him next year when he is closer to 68.   She is having fatigue and a facial rash, we can check for autoimmune disorders at her request  Orders Placed This Encounter  Procedures  . ANA, IFA (with reflex)  . Rheumatoid factor  . Sjogren's syndrome antibods(ssa + ssb)  . Sedimentation rate  . B12 and Folate Panel  . Methylmalonic acid, serum  . Vitamin B1  . Vitamin B6  . ANA Comprehensive Panel  . CBC with Differential/Platelets  . Comprehensive metabolic panel  . Vitamin D, 25-hydroxy     PRIOR: Still doing very well, > 50% improvement in frequency of migraines.  Ubrelvy caused stomach discomfort. Lasmiditan did not work. Try Nurtec. Continue Fioricet, only acute med that works. Did extra in the cervical paraspinals, also across the occipitalis in the back as well "around the ponytail". Discussed Vicodin, she uses sparingly, ok to give.    Reviewed orally with patient, additionally signature is on file:Consent Form Botulism Toxin Injection For Chronic Migraine    Reviewed orally with patient, additionally signature is on file:  Botulism toxin has been approved by the Federal drug administration for treatment of chronic migraine. Botulism toxin does not cure chronic migraine and it may not be effective in some patients.  The administration of botulism toxin is accomplished by injecting a small  amount of toxin into the muscles of the neck and head. Dosage must be titrated for each individual. Any benefits resulting from botulism toxin tend to wear off after 3 months with a repeat injection required if benefit is to be maintained. Injections are usually done every 3-4 months with maximum effect peak achieved by about 2 or 3 weeks. Botulism toxin is expensive and you should be sure of what costs you will incur resulting from the injection.  The side effects of botulism toxin use for chronic migraine may include:   -Transient, and usually mild, facial weakness with facial injections  -Transient, and usually mild, head or neck weakness with head/neck injections  -Reduction or loss of forehead facial animation due to forehead muscle weakness  -Eyelid drooping  -Dry eye  -Pain at the site of injection or bruising at the site of injection  -Double vision  -Potential unknown long term risks  Contraindications: You should not have Botox if you are pregnant, nursing, allergic to albumin, have an infection, skin condition, or muscle weakness at the site of the injection, or have myasthenia gravis, Lambert-Eaton syndrome, or ALS.  It is also possible that as with any injection, there may be an allergic reaction or no effect from the medication. Reduced effectiveness after repeated injections is sometimes seen and rarely infection at the injection site may occur. All care will be taken to prevent these side effects. If therapy is given over a long time, atrophy and wasting in the muscle injected may occur. Occasionally the patient's become refractory to treatment  because they develop antibodies to the toxin. In this event, therapy needs to be modified.  I have read the above information and consent to the administration of botulism toxin.    BOTOX PROCEDURE NOTE FOR MIGRAINE HEADACHE    Contraindications and precautions discussed with patient(above). Aseptic procedure was observed and patient  tolerated procedure. Procedure performed by Dr. Artemio Aly  The condition has existed for more than 6 months, and pt does not have a diagnosis of ALS, Myasthenia Gravis or Lambert-Eaton Syndrome.  Risks and benefits of injections discussed and pt agrees to proceed with the procedure.  Written consent obtained  These injections are medically necessary. Pt  receives good benefits from these injections. These injections do not cause sedations or hallucinations which the oral therapies may cause.  Description of procedure:  The patient was placed in a sitting position. The standard protocol was used for Botox as follows, with 5 units of Botox injected at each site:   -Procerus muscle, midline injection  -Corrugator muscle, bilateral injection  -Frontalis muscle, bilateral injection, with 2 sites each side, medial injection was performed in the upper one third of the frontalis muscle, in the region vertical from the medial inferior edge of the superior orbital rim. The lateral injection was again in the upper one third of the forehead vertically above the lateral limbus of the cornea, 1.5 cm lateral to the medial injection site.  -Temporalis muscle injection, 4 sites, bilaterally. The first injection was 3 cm above the tragus of the ear, second injection site was 1.5 cm to 3 cm up from the first injection site in line with the tragus of the ear. The third injection site was 1.5-3 cm forward between the first 2 injection sites. The fourth injection site was 1.5 cm posterior to the second injection site.   -Occipitalis muscle injection, 3 sites, bilaterally. The first injection was done one half way between the occipital protuberance and the tip of the mastoid process behind the ear. The second injection site was done lateral and superior to the first, 1 fingerbreadth from the first injection. The third injection site was 1 fingerbreadth superiorly and medially from the first injection site.  -Cervical  paraspinal muscle injection, 2 sites, bilateral knee first injection site was 1 cm from the midline of the cervical spine, 3 cm inferior to the lower border of the occipital protuberance. The second injection site was 1.5 cm superiorly and laterally to the first injection site.  -Trapezius muscle injection was performed at 3 sites, bilaterally. The first injection site was in the upper trapezius muscle halfway between the inflection point of the neck, and the acromion. The second injection site was one half way between the acromion and the first injection site. The third injection was done between the first injection site and the inflection point of the neck.   Will return for repeat injection in 3 months.   200 units of Botox was used, any Botox not injected was wasted. The patient tolerated the procedure well, there were no complications of the above procedure.

## 2020-12-21 MED ORDER — ONDANSETRON 4 MG PO TBDP: 4.0000 mg | ORAL_TABLET | Freq: Three times a day (TID) | ORAL | 11 refills | Status: DC | PRN

## 2020-12-21 MED ORDER — PROMETHAZINE HCL 25 MG RE SUPP: 25.0000 mg | Freq: Four times a day (QID) | RECTAL | 6 refills | Status: DC | PRN

## 2020-12-21 MED ORDER — EMGALITY 120 MG/ML ~~LOC~~ SOAJ: 120.0000 mg | SUBCUTANEOUS | 6 refills | Status: DC

## 2020-12-21 MED ORDER — "BD LUER-LOK SYRINGE 23G X 1"" 3 ML MISC": 0 refills | Status: DC

## 2020-12-21 MED ORDER — METHYLPREDNISOLONE 4 MG PO TBPK: ORAL_TABLET | ORAL | 1 refills | Status: DC

## 2020-12-21 MED ORDER — TROKENDI XR 100 MG PO CP24: 1.0000 | ORAL_CAPSULE | Freq: Every day | ORAL | 3 refills | Status: DC

## 2020-12-21 MED ORDER — KETOROLAC TROMETHAMINE 60 MG/2ML IM SOLN: INTRAMUSCULAR | 3 refills | Status: DC

## 2020-12-21 MED ORDER — PROCHLORPERAZINE MALEATE 10 MG PO TABS: 10.0000 mg | ORAL_TABLET | Freq: Four times a day (QID) | ORAL | 11 refills | Status: DC | PRN

## 2020-12-21 NOTE — Addendum Note (Signed)
Addended by: Naomie Dean B on: 12/21/2020 10:32 AM   Modules accepted: Orders

## 2020-12-27 ENCOUNTER — Other Ambulatory Visit: Payer: Self-pay | Admitting: Neurology

## 2020-12-27 MED ORDER — VITAMIN D (ERGOCALCIFEROL) 1.25 MG (50000 UNIT) PO CAPS: 50000.0000 [IU] | ORAL_CAPSULE | ORAL | 1 refills | Status: DC

## 2020-12-27 NOTE — Progress Notes (Signed)
d 

## 2020-12-30 LAB — CBC WITH DIFFERENTIAL/PLATELET
Basophils Absolute: 0 10*3/uL (ref 0.0–0.2)
Basos: 0 %
EOS (ABSOLUTE): 0.2 10*3/uL (ref 0.0–0.4)
Eos: 2 %
Hematocrit: 40.1 % (ref 34.0–46.6)
Hemoglobin: 14 g/dL (ref 11.1–15.9)
Immature Grans (Abs): 0 10*3/uL (ref 0.0–0.1)
Immature Granulocytes: 0 %
Lymphocytes Absolute: 1.1 10*3/uL (ref 0.7–3.1)
Lymphs: 11 %
MCH: 31.4 pg (ref 26.6–33.0)
MCHC: 34.9 g/dL (ref 31.5–35.7)
MCV: 90 fL (ref 79–97)
Monocytes Absolute: 0.3 10*3/uL (ref 0.1–0.9)
Monocytes: 3 %
Neutrophils Absolute: 8.7 10*3/uL — ABNORMAL HIGH (ref 1.4–7.0)
Neutrophils: 84 %
Platelets: 300 10*3/uL (ref 150–450)
RBC: 4.46 x10E6/uL (ref 3.77–5.28)
RDW: 13 % (ref 11.7–15.4)
WBC: 10.3 10*3/uL (ref 3.4–10.8)

## 2020-12-30 LAB — COMPREHENSIVE METABOLIC PANEL
ALT: 16 IU/L (ref 0–32)
AST: 17 IU/L (ref 0–40)
Albumin/Globulin Ratio: 1.6 (ref 1.2–2.2)
Albumin: 4.5 g/dL (ref 3.8–4.8)
Alkaline Phosphatase: 88 IU/L (ref 44–121)
BUN/Creatinine Ratio: 9 (ref 9–23)
BUN: 9 mg/dL (ref 6–24)
Bilirubin Total: 0.2 mg/dL (ref 0.0–1.2)
CO2: 21 mmol/L (ref 20–29)
Calcium: 9 mg/dL (ref 8.7–10.2)
Chloride: 108 mmol/L — ABNORMAL HIGH (ref 96–106)
Creatinine, Ser: 1.05 mg/dL — ABNORMAL HIGH (ref 0.57–1.00)
GFR calc Af Amer: 76 mL/min/{1.73_m2} (ref >59)
GFR calc non Af Amer: 66 mL/min/{1.73_m2} (ref >59)
Globulin, Total: 2.9 g/dL (ref 1.5–4.5)
Glucose: 90 mg/dL (ref 65–99)
Potassium: 4.2 mmol/L (ref 3.5–5.2)
Sodium: 143 mmol/L (ref 134–144)
Total Protein: 7.4 g/dL (ref 6.0–8.5)

## 2020-12-30 LAB — VITAMIN D 25 HYDROXY (VIT D DEFICIENCY, FRACTURES): Vit D, 25-Hydroxy: 12.7 ng/mL — ABNORMAL LOW (ref 30.0–100.0)

## 2020-12-30 LAB — METHYLMALONIC ACID, SERUM: Methylmalonic Acid: 433 nmol/L — ABNORMAL HIGH (ref 0–378)

## 2020-12-30 LAB — ANA COMPREHENSIVE PANEL
Anti JO-1: <0.2 AI (ref 0.0–0.9)
Centromere Ab Screen: <0.2 AI (ref 0.0–0.9)
Chromatin Ab SerPl-aCnc: <0.2 AI (ref 0.0–0.9)
ENA RNP Ab: 0.2 AI (ref 0.0–0.9)
ENA SM Ab Ser-aCnc: <0.2 AI (ref 0.0–0.9)
ENA SSA (RO) Ab: <0.2 AI (ref 0.0–0.9)
ENA SSB (LA) Ab: <0.2 AI (ref 0.0–0.9)
Scleroderma (Scl-70) (ENA) Antibody, IgG: <0.2 AI (ref 0.0–0.9)
dsDNA Ab: 3 IU/mL (ref 0–9)

## 2020-12-30 LAB — VITAMIN B1: Thiamine: 237.6 nmol/L — ABNORMAL HIGH (ref 66.5–200.0)

## 2020-12-30 LAB — RHEUMATOID FACTOR: Rheumatoid fact SerPl-aCnc: <10 IU/mL (ref <14.0)

## 2020-12-30 LAB — B12 AND FOLATE PANEL
Folate: 17 ng/mL (ref >3.0)
Vitamin B-12: 274 pg/mL (ref 232–1245)

## 2020-12-30 LAB — SEDIMENTATION RATE: Sed Rate: 5 mm/hr (ref 0–32)

## 2020-12-30 LAB — VITAMIN B6: Vitamin B6: 9.3 ug/L (ref 2.0–32.8)

## 2020-12-30 LAB — ANTINUCLEAR ANTIBODIES, IFA: ANA Titer 1: NEGATIVE

## 2021-01-09 ENCOUNTER — Ambulatory Visit: Payer: Medicare Other | Admitting: Obstetrics and Gynecology

## 2021-01-19 ENCOUNTER — Other Ambulatory Visit (HOSPITAL_COMMUNITY)
Admission: RE | Admit: 2021-01-19 | Discharge: 2021-01-19 | Disposition: A | Discharge status: Home / Self Care | Payer: Medicare Other | Source: Ambulatory Visit | Attending: Obstetrics and Gynecology | Admitting: Obstetrics and Gynecology

## 2021-01-19 ENCOUNTER — Ambulatory Visit (INDEPENDENT_AMBULATORY_CARE_PROVIDER_SITE_OTHER): Payer: Medicare Other | Admitting: Obstetrics and Gynecology

## 2021-01-19 ENCOUNTER — Other Ambulatory Visit: Payer: Self-pay

## 2021-01-19 ENCOUNTER — Encounter: Payer: Self-pay | Admitting: Obstetrics and Gynecology

## 2021-01-19 VITALS — BP 114/78 | HR 100 | Ht 67.0 in | Wt 177.0 lb

## 2021-01-19 DIAGNOSIS — D069 Carcinoma in situ of cervix, unspecified: Secondary | ICD-10-CM

## 2021-01-19 DIAGNOSIS — Z01411 Encounter for gynecological examination (general) (routine) with abnormal findings: Secondary | ICD-10-CM | POA: Diagnosis not present

## 2021-01-19 DIAGNOSIS — Z9889 Other specified postprocedural states: Secondary | ICD-10-CM | POA: Diagnosis not present

## 2021-01-19 NOTE — Progress Notes (Signed)
Pt had period on September 13 and then not again until Feb 18

## 2021-01-19 NOTE — Progress Notes (Signed)
GYNECOLOGY OFFICE FOLLOW UP NOTE  History:  45 y.o. 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 Has been doing well since then, has only had 2 periods. Followed by PCP for auto-immune issues. No other complaints.    Past Medical History:  Diagnosis Date  . Depression   . Eczema    scalp  . Family history of adverse reaction to anesthesia    mother struggles with sob, slow to awaken, n/v bedridden for days  . Fibromyalgia 2018  . GERD (gastroesophageal reflux disease)   . Hemiplegic migraine   . IBS (irritable bowel syndrome)   . Migraine   . MRSA (methicillin resistant staph aureus) culture positive not sure when  . Vaginal Pap smear, abnormal     Past Surgical History:  Procedure Laterality Date  . CERVICAL CONIZATION W/BX N/A 07/05/2020   Procedure: COLD KNIFE CONIZATION CERVIX WITH BIOPSY; COLPOSCOPY AND ENDOCERVICAL CURRETAGE;  Surgeon: Conan Bowens, MD;  Location: [facility];  Service: Gynecology;  Laterality: N/A;  . COLPOSCOPY     x 3 with biopsy on 1 done  . LEEP  2020  . MULTIPLE TOOTH EXTRACTIONS     as child  . WRIST SURGERY Left 2010   cyst removal     Current Outpatient Medications:  .  ALLERGY RELIEF/NASAL DECONGEST 10-240 MG 24 hr tablet, TAKE ONE TABLET BY MOUTH DAILY, Disp: 30 tablet, Rfl: 11 .  butalbital-acetaminophen-caffeine (FIORICET) 50-325-40 MG tablet, Take 1-2 tablets by mouth every 6 (six) hours as needed for headache., Disp: 15 tablet, Rfl: 5 .  FLUoxetine (PROZAC) 10 MG capsule, Take 1 capsule (10 mg total) by mouth daily. Take with 20 mg capsules for total daily dose 30 mg, Disp: 90 capsule, Rfl: 3 .  FLUoxetine (PROZAC) 20 MG capsule, Take 1 capsule (20 mg total) by mouth daily., Disp: 90 capsule, Rfl: 3 .  fluticasone (FLONASE) 50 MCG/ACT nasal spray, Place 2 sprays into both nostrils daily., Disp: 16 g, Rfl: 1 .  Galcanezumab-gnlm (EMGALITY)  120 MG/ML SOAJ, Inject 120 mg into the skin every 30 (thirty) days., Disp: 3 mL, Rfl: 6 .  HYDROcodone-acetaminophen (NORCO/VICODIN) 5-325 MG tablet, Take 1 tablet by mouth every 6 (six) hours as needed for moderate pain., Disp: 60 tablet, Rfl: 0 .  ibuprofen (ADVIL) 800 MG tablet, Take 1 tablet (800 mg total) by mouth 3 (three) times daily with meals as needed for headache, moderate pain or cramping., Disp: 90 tablet, Rfl: 1 .  ketorolac (TORADOL) 60 MG/2ML SOLN injection, INJECT 60 MG(2 ML) INTO THE MUSCLE EVERY 6 HOURS AS NEEDED FOR MIGRAINE. MAXIMUM DAILY DOSE IS 120 MG. DO NOT USE MORE THAN 4 DAYS IN A MONTH., Disp: 8 mL, Rfl: 3 .  methylPREDNISolone (MEDROL DOSEPAK) 4 MG TBPK tablet, Take pill sdaily with food in the morning for 6 days, Disp: 21 tablet, Rfl: 1 .  NEEDLE, DISP, 23 G 23G X 1" MISC, Attach to syringe to draw up and administer Toradol into the muscle., Disp: 4 each, Rfl: 0 .  norethindrone (MICRONOR) 0.35 MG tablet, Take 1 tablet (0.35 mg total) by mouth daily., Disp: 84 tablet, Rfl: 3 .  omeprazole (PRILOSEC) 40 MG capsule, Take 1 capsule (40 mg total) by mouth daily., Disp: 90 capsule, Rfl: 1 .  ondansetron (ZOFRAN-ODT) 4 MG disintegrating tablet, Take 1 tablet (4 mg total) by mouth every 8 (eight) hours as needed  for nausea., Disp: 30 tablet, Rfl: 11 .  prochlorperazine (COMPAZINE) 10 MG tablet, Take 1 tablet (10 mg total) by mouth every 6 (six) hours as needed., Disp: 30 tablet, Rfl: 11 .  promethazine (PHENERGAN) 25 MG suppository, Place 1 suppository (25 mg total) rectally every 6 (six) hours as needed for nausea or vomiting., Disp: 12 each, Rfl: 6 .  Syringe, Disposable, (B-D SYRINGE LUER-LOK 3CC) 3 ML MISC, Attach to needle to draw up and administer Toradol into the muscle., Disp: 4 each, Rfl: 0 .  SYRINGE-NEEDLE, DISP, 3 ML (B-D 3CC LUER-LOK SYR 23GX1") 23G X 1" 3 ML MISC, Attach to syringe to draw up and administer Toradol into the muscle., Disp: 4 each, Rfl: 0 .  Topiramate  ER (TROKENDI XR) 100 MG CP24, Take 1 capsule by mouth at bedtime., Disp: 90 capsule, Rfl: 3 .  triamcinolone cream (KENALOG) 0.1 %, Apply 1 application topically 2 (two) times daily. To affected area(s) as needed, Disp: 45 g, Rfl: 1 .  Vitamin D, Ergocalciferol, (DRISDOL) 1.25 MG (50000 UNIT) CAPS capsule, Take 1 capsule (50,000 Units total) by mouth every 7 (seven) days., Disp: 5 capsule, Rfl: 1  The following portions of the patient's history were reviewed and updated as appropriate: allergies, current medications, past family history, past medical history, past social history, past surgical history and problem list.   Review of Systems:  Pertinent items noted in HPI and remainder of comprehensive ROS otherwise negative.   Objective:  Physical Exam BP 114/78   Pulse 100   Ht 5\' 7"  (1.702 m)   Wt 177 lb (80.3 kg)   LMP 01/06/2021   BMI 27.72 kg/m  CONSTITUTIONAL: Well-developed, well-nourished female in no acute distress.  HENT:  Normocephalic, atraumatic. External right and left ear normal. Oropharynx is clear and moist EYES: Conjunctivae and EOM are normal. Pupils are equal, round, and reactive to light. No scleral icterus.  NECK: Normal range of motion, supple, no masses SKIN: Skin is warm and dry. No rash noted. Not diaphoretic. No erythema. No pallor. NEUROLOGIC: Alert and oriented to person, place, and time. Normal reflexes, muscle tone coordination. No cranial nerve deficit noted. PSYCHIATRIC: Normal mood and affect. Normal behavior. Normal judgment and thought content. CARDIOVASCULAR: Normal heart rate noted RESPIRATORY: Effort normal, no problems with respiration noted ABDOMEN: Soft, no distention noted.   PELVIC: Normal appearing external genitalia; normal appearing vaginal mucosa, cervix with prominent SQJ.  No abnormal discharge noted.  Pap smear obtained.   MUSCULOSKELETAL: Normal range of motion. No edema noted.  Exam done with chaperone present.  Labs and Imaging No  results found.  Assessment & Plan:  1. CIN III (cervical intraepithelial neoplasia grade III) with severe dysplasia Repeat pap today Await results  2. History of conization of cervix Doing well   Routine preventative health maintenance measures emphasized. Please refer to After Visit Summary for other counseling recommendations.   Return for will contact patient with results for follow up.  Total face-to-face time with patient: 15 minutes. Over 50% of encounter was spent on counseling and coordination of care.  01/08/2021, MD, [facility] Attending Center for [facility])

## 2021-01-23 LAB — CYTOLOGY - PAP
Comment: NEGATIVE
Diagnosis: NEGATIVE
Diagnosis: REACTIVE
High risk HPV: NEGATIVE

## 2021-01-31 ENCOUNTER — Encounter: Payer: Self-pay | Admitting: Osteopathic Medicine

## 2021-01-31 ENCOUNTER — Ambulatory Visit (INDEPENDENT_AMBULATORY_CARE_PROVIDER_SITE_OTHER): Payer: Medicare Other | Admitting: Osteopathic Medicine

## 2021-01-31 ENCOUNTER — Other Ambulatory Visit: Payer: Self-pay

## 2021-01-31 VITALS — BP 130/72 | HR 93 | Temp 97.4°F | Wt 181.0 lb

## 2021-01-31 DIAGNOSIS — G43419 Hemiplegic migraine, intractable, without status migrainosus: Secondary | ICD-10-CM

## 2021-01-31 DIAGNOSIS — F339 Major depressive disorder, recurrent, unspecified: Secondary | ICD-10-CM

## 2021-01-31 DIAGNOSIS — Z0000 Encounter for general adult medical examination without abnormal findings: Secondary | ICD-10-CM

## 2021-01-31 DIAGNOSIS — Z808 Family history of malignant neoplasm of other organs or systems: Secondary | ICD-10-CM

## 2021-01-31 DIAGNOSIS — E538 Deficiency of other specified B group vitamins: Secondary | ICD-10-CM | POA: Diagnosis not present

## 2021-01-31 DIAGNOSIS — E559 Vitamin D deficiency, unspecified: Secondary | ICD-10-CM

## 2021-01-31 DIAGNOSIS — M791 Myalgia, unspecified site: Secondary | ICD-10-CM

## 2021-01-31 DIAGNOSIS — R7989 Other specified abnormal findings of blood chemistry: Secondary | ICD-10-CM

## 2021-01-31 DIAGNOSIS — N921 Excessive and frequent menstruation with irregular cycle: Secondary | ICD-10-CM

## 2021-01-31 DIAGNOSIS — D061 Carcinoma in situ of exocervix: Secondary | ICD-10-CM

## 2021-01-31 DIAGNOSIS — M797 Fibromyalgia: Secondary | ICD-10-CM | POA: Diagnosis not present

## 2021-01-31 NOTE — Patient Instructions (Signed)
General Preventive Care  Most recent routine screening labs: ordered.   Blood pressure goal 130/80 or less.   Tobacco: don't!   Alcohol: responsible moderation is ok for most adults - if you have concerns about your alcohol intake, please talk to me!   Exercise: as tolerated to reduce risk of cardiovascular disease and diabetes. Strength training will also prevent osteoporosis.   Mental health: if need for mental health care (medicines, counseling, other), or concerns about moods, please let me know!   Sexual / Reproductive health: if need for STD testing, or if concerns with libido/pain problems, please let me or OBGYN know!   Advanced Directive: Living Will and/or Healthcare Power of Attorney recommended for all adults, regardless of age or health.  Vaccines  Flu vaccine: every fall/winter  Shingles vaccine: after age 66.   Pneumonia vaccines: after age 86.  Tetanus booster: every 10 years (due 2026)   COVID vaccine: strongly recommended! If patients choose not to vaccinate, be EXTRA diligent about limiting socialization, wearing masks in public (preferably well-fitted surgical/N95/KN95 masks) Cancer screenings   Colon cancer screening: for everyone age 72-75.  Breast cancer screening: mammogram at age 74 every other year at least, and annually after age 25.   Cervical cancer screening: Pap per OBGYN  Lung cancer screening: not needed for non-smokers  Infection screenings  . HIV: recommended screening at least once age 15-65 . Gonorrhea/Chlamydia: screening as needed . Hepatitis C: recommended once for everyone age 61-75 . TB: at-risk populations, or depending on work requirements and/or travel history Other . Bone Density Test: recommended for women at age 77

## 2021-01-31 NOTE — Progress Notes (Unsigned)
Holly [name] is a 45 y.o. female who presents to  [facility]  today, 01/31/21, seeking care for the following:  . Annual physical  . Peristent intermittent myalgia, recently on steroids when other rheum labs drawn, requests repeat later date      [place] with other pertinent findings:  The primary encounter diagnosis was Annual physical exam. Diagnoses of Fibromyalgia syndrome, Vitamin D deficiency, B12 deficiency, Intractable hemiplegic migraine without status migrainosus, Depression, recurrent ([place]), Carcinoma in situ of exocervix, Menometrorrhagia, Myalgia, Family history of thyroid cancer, and Abnormal TSH were also pertinent to this visit.    Patient Instructions  General Preventive Care  Most recent routine screening labs: ordered.   Blood pressure goal 130/80 or less.   Tobacco: don't!   Alcohol: responsible moderation is ok for most adults - if you have concerns about your alcohol intake, please talk to me!   Exercise: as tolerated to reduce risk of cardiovascular disease and diabetes. Strength training will also prevent osteoporosis.   Mental health: if need for mental health care (medicines, counseling, other), or concerns about moods, please let me know!   Sexual / Reproductive health: if need for STD testing, or if concerns with libido/pain problems, please let me or OBGYN know!   Advanced Directive: Living Will and/or Healthcare Power of Attorney recommended for all adults, regardless of age or health.  Vaccines  Flu vaccine: every fall/winter  Shingles vaccine: after age 26.   Pneumonia vaccines: after age 41.  Tetanus booster: every 10 years (due 2026)   COVID vaccine: strongly recommended! If patients choose not to vaccinate, be EXTRA diligent about limiting socialization, wearing masks in public (preferably well-fitted surgical/N95/KN95 masks) Cancer screenings   Colon cancer  screening: for everyone age 40-75.  Breast cancer screening: mammogram at age 27 every other year at least, and annually after age 62.   Cervical cancer screening: Pap per OBGYN  Lung cancer screening: not needed for non-smokers  Infection screenings  . HIV: recommended screening at least once age 82-65 . Gonorrhea/Chlamydia: screening as needed . Hepatitis C: recommended once for everyone age 29-75 . TB: at-risk populations, or depending on work requirements and/or travel history Other . Bone Density Test: recommended for women at age 64   Orders Placed This Encounter  Procedures  . CBC  . COMPLETE METABOLIC PANEL WITH GFR  . Lipid panel  . VITAMIN D 25 Hydroxy (Vit-D Deficiency, Fractures)  . Vitamin B12  . TSH  . T4, free  . Sedimentation rate  . High sensitivity CRP  . ANA  . Rheumatoid factor    No orders of the defined types were placed in this encounter.    See below for relevant physical exam findings  See below for recent lab and imaging results reviewed  Medications, allergies, PMH, PSH, SocH, [place] reviewed below    Follow-up instructions: Return in about 1 year (around 01/31/2022) for ANNUAL PHYSICAL - SEE [place] SOONER IF NEEDED.                                        Exam:  BP 130/72 (BP Location: Left Arm, Patient Position: Sitting, Cuff Size: Normal)   Pulse 93   Temp (!) 97.4 F (36.3 C) (Oral)   Wt 181 lb (82.1 kg)   LMP 01/06/2021   BMI 28.35 kg/m  Constitutional: VS see above. General Appearance: alert, well-developed, well-nourished, NAD  Neck: No masses, trachea midline.   Respiratory: Normal respiratory effort. no wheeze, no rhonchi, no rales  Cardiovascular: S1/S2 normal, no murmur, no rub/gallop auscultated. RRR.   Musculoskeletal: Gait normal. Symmetric and independent movement of all extremities  Abdominal: non-tender, non-distended, no appreciable organomegaly, neg Murphy's, BS  WNLx4  Neurological: Normal balance/coordination. No tremor.  Skin: warm, dry, intact.   Psychiatric: Normal judgment/insight. Normal mood and affect. Oriented x3.   Current Meds  Medication Sig  . ALLERGY RELIEF/NASAL DECONGEST 10-240 MG 24 hr tablet TAKE ONE TABLET BY MOUTH DAILY  . butalbital-acetaminophen-caffeine (FIORICET) 50-325-40 MG tablet Take 1-2 tablets by mouth every 6 (six) hours as needed for headache.  Marland Kitchen FLUoxetine (PROZAC) 10 MG capsule Take 1 capsule (10 mg total) by mouth daily. Take with 20 mg capsules for total daily dose 30 mg  . FLUoxetine (PROZAC) 20 MG capsule Take 1 capsule (20 mg total) by mouth daily.  . fluticasone (FLONASE) 50 MCG/ACT nasal spray Place 2 sprays into both nostrils daily.  . Galcanezumab-gnlm (EMGALITY) 120 MG/ML SOAJ Inject 120 mg into the skin every 30 (thirty) days.  Marland Kitchen HYDROcodone-acetaminophen (NORCO/VICODIN) 5-325 MG tablet Take 1 tablet by mouth every 6 (six) hours as needed for moderate pain.  Marland Kitchen ibuprofen (ADVIL) 800 MG tablet Take 1 tablet (800 mg total) by mouth 3 (three) times daily with meals as needed for headache, moderate pain or cramping.  Marland Kitchen ketorolac (TORADOL) 60 MG/2ML SOLN injection INJECT 60 MG(2 ML) INTO THE MUSCLE EVERY 6 HOURS AS NEEDED FOR MIGRAINE. MAXIMUM DAILY DOSE IS 120 MG. DO NOT USE MORE THAN 4 DAYS IN A MONTH.  . methylPREDNISolone (MEDROL DOSEPAK) 4 MG TBPK tablet Take pill sdaily with food in the morning for 6 days  . NEEDLE, DISP, 23 G 23G X 1" MISC Attach to syringe to draw up and administer Toradol into the muscle.  . norethindrone (MICRONOR) 0.35 MG tablet Take 1 tablet (0.35 mg total) by mouth daily.  Marland Kitchen omeprazole (PRILOSEC) 40 MG capsule Take 1 capsule (40 mg total) by mouth daily.  . ondansetron (ZOFRAN-ODT) 4 MG disintegrating tablet Take 1 tablet (4 mg total) by mouth every 8 (eight) hours as needed for nausea.  . prochlorperazine (COMPAZINE) 10 MG tablet Take 1 tablet (10 mg total) by mouth every 6 (six)  hours as needed.  . promethazine (PHENERGAN) 25 MG suppository Place 1 suppository (25 mg total) rectally every 6 (six) hours as needed for nausea or vomiting.  . Syringe, Disposable, (B-D SYRINGE LUER-LOK 3CC) 3 ML MISC Attach to needle to draw up and administer Toradol into the muscle.  Marland Kitchen SYRINGE-NEEDLE, DISP, 3 ML (B-D 3CC LUER-LOK SYR 23GX1") 23G X 1" 3 ML MISC Attach to syringe to draw up and administer Toradol into the muscle.  . Topiramate ER (TROKENDI XR) 100 MG CP24 Take 1 capsule by mouth at bedtime.  . triamcinolone cream (KENALOG) 0.1 % Apply 1 application topically 2 (two) times daily. To affected area(s) as needed  . Vitamin D, Ergocalciferol, (DRISDOL) 1.25 MG (50000 UNIT) CAPS capsule Take 1 capsule (50,000 Units total) by mouth every 7 (seven) days.    Allergies  Allergen Reactions  . Other Rash    Overly sleepy zyrtec  . Terbutaline Palpitations  . Sumatriptan   . Adhesive [Tape] Itching and Rash  . Aimovig [Erenumab-Aooe] Rash  . Celebrex [Celecoxib] Rash    03/17/18 patient is unsure, thinks it may have been  shaving cream  . Cetirizine Hcl Other (See Comments)    Overly sleepy   . Ciprofloxacin Rash    Swelling of knees    Patient Active Problem List   Diagnosis Date Noted  . Injury of foot, left 11/29/2020  . Carcinoma in situ of exocervix   . Vitamin D deficiency 11/30/2019  . Low HDL (under 40) 11/30/2019  . Rib pain on right side 02/26/2018  . Menometrorrhagia 01/20/2018  . Dysmenorrhea 01/20/2018  . Fibromyalgia syndrome 09/30/2017  . Acute pain of left knee 05/27/2017  . 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 High grade squamous intraepithelial cervical dysplasia 02/26/2017  . Depression, recurrent ([place]) 01/09/2017  . Chronic seasonal allergic rhinitis 11/27/2016  . 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 Thyroid disease Mother   . Scoliosis Mother   .  Depression Mother   . Migraines Mother   . Hyperlipidemia Mother   . Hypertension Mother   . Heart attack Mother   . Crohn's disease Father   . Autoimmune disease Father   . Factor V Leiden deficiency Father   . Heart failure Father   . Diabetes Father   . Hyperlipidemia Father   . Heart attack Father   . Cancer Sister   . Depression Sister   . Diabetes Sister   . Diabetes Paternal Grandfather     Social History   Tobacco Use  Smoking Status Never Smoker  Smokeless Tobacco Never Used    Past Surgical History:  Procedure Laterality Date  . CERVICAL CONIZATION W/BX N/A 07/05/2020   Procedure: COLD KNIFE CONIZATION CERVIX WITH BIOPSY; COLPOSCOPY AND ENDOCERVICAL CURRETAGE;  Surgeon: Sloan Leiter, MD;  Location: [facility];  Service: Gynecology;  Laterality: N/A;  . COLPOSCOPY     x 3 with biopsy on 1 done  . LEEP  2020  . MULTIPLE TOOTH EXTRACTIONS     as child  . WRIST SURGERY Left 2010   cyst removal    Immunization History  Administered Date(s) Administered  . Influenza-Unspecified 08/19/2016, 09/02/2016  . Tdap 08/14/2009, 05/02/2015    Recent Results (from the past 2160 hour(s))  ANA, IFA (with reflex)     Status: None   Collection Time: 12/20/20  4:33 PM  Result Value Ref Range   ANA Titer 1 Negative     Comment:                                      Negative   <1:80                                      Borderline  1:80                                      Positive   >1:80 ICAP nomenclature: AC-0 For more information about Hep-2 cell patterns use ANApatterns.org, the official website for the International Consensus on Antinuclear Antibody (ANA) Patterns (ICAP).   Rheumatoid factor     Status: None   Collection Time: 12/20/20  4:33 PM  Result Value Ref Range   Rhuematoid fact SerPl-aCnc <10.0 <14.0 IU/mL  Sedimentation rate  Status: None   Collection Time: 12/20/20  4:33 PM  Result Value Ref Range   Sed Rate 5 0 - 32 mm/hr  B12 and  Folate Panel     Status: None   Collection Time: 12/20/20  4:33 PM  Result Value Ref Range   Vitamin B-12 274 232 - 1,245 pg/mL   Folate 17.0 >3.0 ng/mL    Comment: A serum folate concentration of less than 3.1 ng/mL is considered to represent clinical deficiency.   Methylmalonic acid, serum     Status: Abnormal   Collection Time: 12/20/20  4:33 PM  Result Value Ref Range   Methylmalonic Acid 433 (H) 0 - 378 nmol/L  Vitamin B1     Status: Abnormal   Collection Time: 12/20/20  4:33 PM  Result Value Ref Range   Thiamine 237.6 (H) 66.5 - 200.0 nmol/L  Vitamin B6     Status: None   Collection Time: 12/20/20  4:33 PM  Result Value Ref Range   Vitamin B6 9.3 2.0 - 32.8 ug/L  ANA Comprehensive Panel     Status: None   Collection Time: 12/20/20  4:33 PM  Result Value Ref Range   dsDNA Ab 3 0 - 9 IU/mL    Comment:                                    Negative      <5                                    Equivocal  5 - 9                                    Positive      >9    ENA RNP Ab 0.2 0.0 - 0.9 AI   ENA SM Ab Ser-aCnc <0.2 0.0 - 0.9 AI   Scleroderma (Scl-70) (ENA) Antibody, IgG <0.2 0.0 - 0.9 AI   ENA SSA (RO) Ab <0.2 0.0 - 0.9 AI   ENA SSB (LA) Ab <0.2 0.0 - 0.9 AI   Chromatin Ab SerPl-aCnc <0.2 0.0 - 0.9 AI   Anti JO-1 <0.2 0.0 - 0.9 AI   Centromere Ab Screen <0.2 0.0 - 0.9 AI   See below: Comment     Comment: Autoantibody                       Disease Association ------------------------------------------------------------                         Condition                  Frequency ---------------------   ------------------------   --------- Antinuclear Antibody,    SLE, mixed connective Direct (ANA-D)           tissue diseases ---------------------   ------------------------   --------- dsDNA                    SLE                        40 - 60% ---------------------   ------------------------   --------- Chromatin  Drug induced SLE                90%                           SLE                        48 - 97% ---------------------   ------------------------   --------- SSA (Ro)                 SLE                        25 - 35%                          Sjogren's Syndrome         40 - 70%                          Neonatal Lupus                 100% ---------------------   ------------------------   --------- SSB (La)                 SLE                              10%                          Sjogren's Syndrome              30% ---------------------   -----------------------    --------- Sm (anti-Smith)          SLE                        15 - 30% ---------------------   -----------------------    --------- RNP                      Mixed Connective Tissue                          Disease                         95% (U1 nRNP,                SLE                        30 - 50% anti-ribonucleoprotein)  Polymyositis and/or                          Dermatomyositis                 20% ---------------------   ------------------------   --------- Scl-70 (antiDNA          Scleroderma (diffuse)      20 - 35% topoisomerase)           Crest                           13% ---------------------   ------------------------   --------- Jo-1  Polymyositis and/or                          Dermatomyositis            20 - 40% ---------------------   ------------------------   --------- Centromere B             Scleroderma -  Crest                          variant                         80%   CBC with Differential/Platelets     Status: Abnormal   Collection Time: 12/20/20  4:33 PM  Result Value Ref Range   WBC 10.3 3.4 - 10.8 x10E3/uL   RBC 4.46 3.77 - 5.28 x10E6/uL   Hemoglobin 14.0 11.1 - 15.9 g/dL   Hematocrit 40.1 34.0 - 46.6 %   MCV 90 79 - 97 fL   MCH 31.4 26.6 - 33.0 pg   MCHC 34.9 31.5 - 35.7 g/dL   RDW 13.0 11.7 - 15.4 %   Platelets 300 150 - 450 x10E3/uL   Neutrophils 84 Not Estab. %   Lymphs 11 Not Estab. %   Monocytes 3  Not Estab. %   Eos 2 Not Estab. %   Basos 0 Not Estab. %   Neutrophils Absolute 8.7 (H) 1.4 - 7.0 x10E3/uL   Lymphocytes Absolute 1.1 0.7 - 3.1 x10E3/uL   Monocytes Absolute 0.3 0.1 - 0.9 x10E3/uL   EOS (ABSOLUTE) 0.2 0.0 - 0.4 x10E3/uL   Basophils Absolute 0.0 0.0 - 0.2 x10E3/uL   Immature Granulocytes 0 Not Estab. %   Immature Grans (Abs) 0.0 0.0 - 0.1 x10E3/uL  Comprehensive metabolic panel     Status: Abnormal   Collection Time: 12/20/20  4:33 PM  Result Value Ref Range   Glucose 90 65 - 99 mg/dL   BUN 9 6 - 24 mg/dL   Creatinine, Ser 1.05 (H) 0.57 - 1.00 mg/dL   GFR calc non Af Amer 66 >59 mL/min/1.73   GFR calc Af Amer 76 >59 mL/min/1.73    Comment: **In accordance with recommendations from the NKF-ASN Task force,**   [facility] is in the process of updating its eGFR calculation to the   2021 CKD-EPI creatinine equation that estimates kidney function   without a race variable.    BUN/Creatinine Ratio 9 9 - 23   Sodium 143 134 - 144 mmol/L   Potassium 4.2 3.5 - 5.2 mmol/L   Chloride 108 (H) 96 - 106 mmol/L   CO2 21 20 - 29 mmol/L   Calcium 9.0 8.7 - 10.2 mg/dL   Total Protein 7.4 6.0 - 8.5 g/dL   Albumin 4.5 3.8 - 4.8 g/dL   Globulin, Total 2.9 1.5 - 4.5 g/dL   Albumin/Globulin Ratio 1.6 1.2 - 2.2   Bilirubin Total 0.2 0.0 - 1.2 mg/dL   Alkaline Phosphatase 88 44 - 121 IU/L   AST 17 0 - 40 IU/L   ALT 16 0 - 32 IU/L  Vitamin D, 25-hydroxy     Status: Abnormal   Collection Time: 12/20/20  4:33 PM  Result Value Ref Range   Vit D, 25-Hydroxy 12.7 (L) 30.0 - 100.0 ng/mL    Comment: Vitamin D deficiency has been defined by the Institute of Medicine and an Endocrine Society practice guideline as a  level of serum 25-OH vitamin D less than 20 ng/mL (1,2). The Endocrine Society went on to further define vitamin D insufficiency as a level between 21 and 29 ng/mL (2). 1. IOM (Institute of Medicine). 2010. Dietary reference    intakes for calcium and D. [place]: The     [employer]. 2. Holick MF, Binkley [place], Bischoff-Ferrari HA, et al.    Evaluation, treatment, and prevention of vitamin D    deficiency: an Endocrine Society clinical practice    guideline. JCEM. 2011 Jul; 96(7):1911-30.   Cytology - PAP( [place])     Status: None   Collection Time: 01/19/21  1:43 PM  Result Value Ref Range   High risk HPV Negative    Adequacy      Satisfactory for evaluation; transformation zone component PRESENT.   Diagnosis      - Negative for Intraepithelial Lesions or Malignancy (NILM)   Diagnosis - Benign reactive/reparative changes    Comment Normal Reference Range HPV - Negative     No results found.     All questions at time of visit were answered - patient instructed to contact office with any additional concerns or updates. ER/RTC precautions were reviewed with the patient as applicable.   Please note: manual typing as well as voice recognition software may have been used to produce this document - typos may escape review. Please contact Dr. Sheppard Coil for any needed clarifications.

## 2021-02-20 ENCOUNTER — Ambulatory Visit: Payer: Medicare Other | Admitting: Obstetrics & Gynecology

## 2021-02-20 ENCOUNTER — Other Ambulatory Visit: Payer: Self-pay | Admitting: Neurology

## 2021-02-25 ENCOUNTER — Other Ambulatory Visit: Payer: Self-pay | Admitting: Neurology

## 2021-03-17 LAB — LIPID PANEL
Cholesterol: 160 mg/dL (ref <200)
HDL: 35 mg/dL — ABNORMAL LOW (ref >=50)
LDL Cholesterol (Calc): 103 mg/dL (calc) — ABNORMAL HIGH
Non-HDL Cholesterol (Calc): 125 mg/dL (calc) (ref <130)
Total CHOL/HDL Ratio: 4.6 (calc) (ref <5.0)
Triglycerides: 127 mg/dL (ref <150)

## 2021-03-17 LAB — COMPLETE METABOLIC PANEL WITH GFR
AG Ratio: 1.6 (calc) (ref 1.0–2.5)
ALT: 10 U/L (ref 6–29)
AST: 14 U/L (ref 10–30)
Albumin: 4.5 g/dL (ref 3.6–5.1)
Alkaline phosphatase (APISO): 82 U/L (ref 31–125)
BUN: 11 mg/dL (ref 7–25)
CO2: 25 mmol/L (ref 20–32)
Calcium: 9.2 mg/dL (ref 8.6–10.2)
Chloride: 104 mmol/L (ref 98–110)
Creat: 0.97 mg/dL (ref 0.50–1.10)
GFR, Est African American: 83 mL/min/{1.73_m2} (ref >=60)
GFR, Est Non African American: 72 mL/min/{1.73_m2} (ref >=60)
Globulin: 2.9 g/dL (calc) (ref 1.9–3.7)
Glucose, Bld: 95 mg/dL (ref 65–139)
Potassium: 3.9 mmol/L (ref 3.5–5.3)
Sodium: 137 mmol/L (ref 135–146)
Total Bilirubin: 0.6 mg/dL (ref 0.2–1.2)
Total Protein: 7.4 g/dL (ref 6.1–8.1)

## 2021-03-17 LAB — CBC
HCT: 37.9 % (ref 35.0–45.0)
Hemoglobin: 13 g/dL (ref 11.7–15.5)
MCH: 31.3 pg (ref 27.0–33.0)
MCHC: 34.3 g/dL (ref 32.0–36.0)
MCV: 91.1 fL (ref 80.0–100.0)
MPV: 8.4 fL (ref 7.5–12.5)
Platelets: 247 10*3/uL (ref 140–400)
RBC: 4.16 10*6/uL (ref 3.80–5.10)
RDW: 12.6 % (ref 11.0–15.0)
WBC: 7.7 10*3/uL (ref 3.8–10.8)

## 2021-03-17 LAB — VITAMIN B12: Vitamin B-12: 417 pg/mL (ref 200–1100)

## 2021-03-17 LAB — TSH: TSH: 1.08 mIU/L

## 2021-03-17 LAB — T4, FREE: Free T4: 0.9 ng/dL (ref 0.8–1.8)

## 2021-03-17 LAB — SEDIMENTATION RATE: Sed Rate: 11 mm/h (ref 0–20)

## 2021-03-17 LAB — RHEUMATOID FACTOR: Rheumatoid fact SerPl-aCnc: <14 IU/mL (ref <14)

## 2021-03-17 LAB — VITAMIN D 25 HYDROXY (VIT D DEFICIENCY, FRACTURES): Vit D, 25-Hydroxy: 57 ng/mL (ref 30–100)

## 2021-03-17 LAB — HIGH SENSITIVITY CRP: hs-CRP: 6.2 mg/L — ABNORMAL HIGH

## 2021-03-17 LAB — ANA: Anti Nuclear Antibody (ANA): NEGATIVE

## 2021-03-21 ENCOUNTER — Ambulatory Visit: Payer: Self-pay | Admitting: Neurology

## 2021-03-23 ENCOUNTER — Encounter: Payer: Self-pay | Admitting: *Deleted

## 2021-03-27 ENCOUNTER — Ambulatory Visit (INDEPENDENT_AMBULATORY_CARE_PROVIDER_SITE_OTHER): Payer: Medicare Other | Admitting: Neurology

## 2021-03-27 DIAGNOSIS — G43711 Chronic migraine without aura, intractable, with status migrainosus: Secondary | ICD-10-CM | POA: Diagnosis not present

## 2021-03-27 DIAGNOSIS — G43411 Hemiplegic migraine, intractable, with status migrainosus: Secondary | ICD-10-CM

## 2021-03-27 MED ORDER — BUTALBITAL-APAP-CAFFEINE 50-325-40 MG PO TABS: 1.0000 | ORAL_TABLET | Freq: Four times a day (QID) | ORAL | 5 refills | Status: DC | PRN

## 2021-03-27 MED ORDER — ONDANSETRON 4 MG PO TBDP: 4.0000 mg | ORAL_TABLET | Freq: Three times a day (TID) | ORAL | 11 refills | Status: DC | PRN

## 2021-03-27 MED ORDER — BD SYRINGE LUER-LOK 3 ML MISC: 6 refills | Status: DC

## 2021-03-27 MED ORDER — IBUPROFEN 800 MG PO TABS: 800.0000 mg | ORAL_TABLET | Freq: Three times a day (TID) | ORAL | 1 refills | Status: DC | PRN

## 2021-03-27 MED ORDER — EMGALITY 120 MG/ML ~~LOC~~ SOAJ: 120.0000 mg | SUBCUTANEOUS | 6 refills | Status: DC

## 2021-03-27 MED ORDER — PROMETHAZINE HCL 25 MG RE SUPP
25.0000 mg | Freq: Four times a day (QID) | RECTAL | 11 refills | Status: DC | PRN
Start: 2021-03-27 — End: 2022-01-23

## 2021-03-27 MED ORDER — "BD LUER-LOK SYRINGE 23G X 1"" 3 ML MISC": 6 refills | Status: DC

## 2021-03-27 MED ORDER — HYDROCODONE-ACETAMINOPHEN 5-325 MG PO TABS: 1.0000 | ORAL_TABLET | Freq: Four times a day (QID) | ORAL | 0 refills | Status: DC | PRN

## 2021-03-27 MED ORDER — METHYLPREDNISOLONE 4 MG PO TBPK: ORAL_TABLET | ORAL | 1 refills | Status: DC

## 2021-03-27 MED ORDER — TROKENDI XR 100 MG PO CP24: 1.0000 | ORAL_CAPSULE | Freq: Every day | ORAL | 3 refills | Status: DC

## 2021-03-27 MED ORDER — PROCHLORPERAZINE MALEATE 10 MG PO TABS: 10.0000 mg | ORAL_TABLET | Freq: Four times a day (QID) | ORAL | 11 refills | Status: DC | PRN

## 2021-03-27 MED ORDER — "NEEDLE (DISP) 23G X 1"" MISC": 6 refills | Status: DC

## 2021-03-27 MED ORDER — KETOROLAC TROMETHAMINE 60 MG/2ML IM SOLN: INTRAMUSCULAR | 3 refills | Status: DC

## 2021-03-27 NOTE — Progress Notes (Signed)
Botox- 200 units x 1 vial Lot: [id number] Expiration: 11/2023 NDC: 0023-3921-02  Bacteriostatic 0.9% Sodium Chloride- 4mL total Lot: [id number] Expiration: 04/19/2022 NDC: 0409-1966-02  Dx: G43.711 B/B  

## 2021-03-27 NOTE — Progress Notes (Signed)
Consent Form Botulism Toxin Injection For Chronic Migraine    5/9/202: No masseters or orb oculi. We put extra in the cervical and trapezius. None in the LS she couldn't raise her arms. Stable see below.   12/20/2020: Doing extremely well on botox > 55% improvement in frequency and severity of migraines and headaches per patient today.Ubrelvy caused stomach discomfort. Lasmiditan did not work. Try Nurtec. Continue Fioricet, only acute med that works. Did extra in the cervical paraspinals, also across the occipitalis in the back as well "around the ponytail". DO NOT DO ANY IN THE LS it made it worse just a little extra in the occipital area and in the cervical paraspinals and traps. Her son has migraines and is 80 we will see him next year when he is closer to 34.   She is having fatigue and a facial rash, we can check for autoimmune disorders at her request  No orders of the defined types were placed in this encounter.    PRIOR: Still doing very well, > 50% improvement in frequency of migraines.  Ubrelvy caused stomach discomfort. Lasmiditan did not work. Try Nurtec. Continue Fioricet, only acute med that works. Did extra in the cervical paraspinals, also across the occipitalis in the back as well "around the ponytail". Discussed Vicodin, she uses sparingly, ok to give.    Reviewed orally with patient, additionally signature is on file:Consent Form Botulism Toxin Injection For Chronic Migraine    Reviewed orally with patient, additionally signature is on file:  Botulism toxin has been approved by the Federal drug administration for treatment of chronic migraine. Botulism toxin does not cure chronic migraine and it may not be effective in some patients.  The administration of botulism toxin is accomplished by injecting a small amount of toxin into the muscles of the neck and head. Dosage must be titrated for each individual. Any benefits resulting from botulism toxin tend to wear off  after 3 months with a repeat injection required if benefit is to be maintained. Injections are usually done every 3-4 months with maximum effect peak achieved by about 2 or 3 weeks. Botulism toxin is expensive and you should be sure of what costs you will incur resulting from the injection.  The side effects of botulism toxin use for chronic migraine may include:   -Transient, and usually mild, facial weakness with facial injections  -Transient, and usually mild, head or neck weakness with head/neck injections  -Reduction or loss of forehead facial animation due to forehead muscle weakness  -Eyelid drooping  -Dry eye  -Pain at the site of injection or bruising at the site of injection  -Double vision  -Potential unknown long term risks  Contraindications: You should not have Botox if you are pregnant, nursing, allergic to albumin, have an infection, skin condition, or muscle weakness at the site of the injection, or have myasthenia gravis, Lambert-Eaton syndrome, or ALS.  It is also possible that as with any injection, there may be an allergic reaction or no effect from the medication. Reduced effectiveness after repeated injections is sometimes seen and rarely infection at the injection site may occur. All care will be taken to prevent these side effects. If therapy is given over a long time, atrophy and wasting in the muscle injected may occur. Occasionally the patient's become refractory to treatment because they develop antibodies to the toxin. In this event, therapy needs to be modified.  I have read the above information and consent to the administration of  botulism toxin.    BOTOX PROCEDURE NOTE FOR MIGRAINE HEADACHE    Contraindications and precautions discussed with patient(above). Aseptic procedure was observed and patient tolerated procedure. Procedure performed by Dr. Artemio Aly  The condition has existed for more than 6 months, and pt does not have a diagnosis of ALS,  Myasthenia Gravis or Lambert-Eaton Syndrome.  Risks and benefits of injections discussed and pt agrees to proceed with the procedure.  Written consent obtained  These injections are medically necessary. Pt  receives good benefits from these injections. These injections do not cause sedations or hallucinations which the oral therapies may cause.  Description of procedure:  The patient was placed in a sitting position. The standard protocol was used for Botox as follows, with 5 units of Botox injected at each site:   -Procerus muscle, midline injection  -Corrugator muscle, bilateral injection  -Frontalis muscle, bilateral injection, with 2 sites each side, medial injection was performed in the upper one third of the frontalis muscle, in the region vertical from the medial inferior edge of the superior orbital rim. The lateral injection was again in the upper one third of the forehead vertically above the lateral limbus of the cornea, 1.5 cm lateral to the medial injection site.  -Temporalis muscle injection, 4 sites, bilaterally. The first injection was 3 cm above the tragus of the ear, second injection site was 1.5 cm to 3 cm up from the first injection site in line with the tragus of the ear. The third injection site was 1.5-3 cm forward between the first 2 injection sites. The fourth injection site was 1.5 cm posterior to the second injection site.   -Occipitalis muscle injection, 3 sites, bilaterally. The first injection was done one half way between the occipital protuberance and the tip of the mastoid process behind the ear. The second injection site was done lateral and superior to the first, 1 fingerbreadth from the first injection. The third injection site was 1 fingerbreadth superiorly and medially from the first injection site.  -Cervical paraspinal muscle injection, 2 sites, bilateral knee first injection site was 1 cm from the midline of the cervical spine, 3 cm inferior to the lower  border of the occipital protuberance. The second injection site was 1.5 cm superiorly and laterally to the first injection site.  -Trapezius muscle injection was performed at 3 sites, bilaterally. The first injection site was in the upper trapezius muscle halfway between the inflection point of the neck, and the acromion. The second injection site was one half way between the acromion and the first injection site. The third injection was done between the first injection site and the inflection point of the neck.   Will return for repeat injection in 3 months.   200 units of Botox was used, any Botox not injected was wasted. The patient tolerated the procedure well, there were no complications of the above procedure.

## 2021-04-18 ENCOUNTER — Ambulatory Visit: Payer: Self-pay | Admitting: Neurology

## 2021-05-14 ENCOUNTER — Other Ambulatory Visit: Payer: Self-pay | Admitting: Osteopathic Medicine

## 2021-05-29 ENCOUNTER — Other Ambulatory Visit: Payer: Self-pay | Admitting: Neurology

## 2021-05-29 NOTE — Telephone Encounter (Signed)
Looks like pt filled this in January, February, May, and July 2022. Ok per Dr Lucia Gaskins provide refill.

## 2021-06-27 ENCOUNTER — Ambulatory Visit: Payer: Self-pay | Admitting: Neurology

## 2021-07-19 ENCOUNTER — Other Ambulatory Visit: Payer: Self-pay | Admitting: Neurology

## 2021-07-20 ENCOUNTER — Other Ambulatory Visit: Payer: Self-pay | Admitting: Neurology

## 2021-07-25 ENCOUNTER — Ambulatory Visit (INDEPENDENT_AMBULATORY_CARE_PROVIDER_SITE_OTHER): Payer: Medicare Other | Admitting: Neurology

## 2021-07-25 ENCOUNTER — Other Ambulatory Visit: Payer: Self-pay

## 2021-07-25 DIAGNOSIS — G43411 Hemiplegic migraine, intractable, with status migrainosus: Secondary | ICD-10-CM

## 2021-07-25 DIAGNOSIS — G43711 Chronic migraine without aura, intractable, with status migrainosus: Secondary | ICD-10-CM | POA: Diagnosis not present

## 2021-07-25 MED ORDER — "NEEDLE (DISP) 23G X 1"" MISC": 6 refills | Status: DC

## 2021-07-25 MED ORDER — METHOCARBAMOL 500 MG PO TABS: 500.0000 mg | ORAL_TABLET | Freq: Four times a day (QID) | ORAL | 6 refills | Status: DC | PRN

## 2021-07-25 MED ORDER — METHYLPREDNISOLONE 4 MG PO TBPK: ORAL_TABLET | ORAL | 1 refills | Status: DC

## 2021-07-25 MED ORDER — ONDANSETRON 8 MG PO TBDP: 8.0000 mg | ORAL_TABLET | Freq: Three times a day (TID) | ORAL | 11 refills | Status: DC | PRN

## 2021-07-25 MED ORDER — TROKENDI XR 100 MG PO CP24: 1.0000 | ORAL_CAPSULE | Freq: Every day | ORAL | 3 refills | Status: DC

## 2021-07-25 MED ORDER — HYDROCODONE-ACETAMINOPHEN 5-325 MG PO TABS: 1.0000 | ORAL_TABLET | Freq: Four times a day (QID) | ORAL | 0 refills | Status: DC | PRN

## 2021-07-25 MED ORDER — BUTALBITAL-APAP-CAFFEINE 50-325-40 MG PO TABS: 1.0000 | ORAL_TABLET | Freq: Four times a day (QID) | ORAL | 5 refills | Status: DC | PRN

## 2021-07-25 NOTE — Progress Notes (Signed)
Consent Form Botulism Toxin Injection For Chronic Migraine    07/25/2021: Doing extremely well on botox > 55% improvement in frequency and severity of migraines and headaches per patient today.Ubrelvy caused stomach discomfort. Lasmiditan did not work. Try Nurtec. Continue Fioricet, only acute med that works. She is not a clencher and she has no pain behind her eyes. We try to position the occipital and cervical injections "around the pony tail area" and at occipitalis more.       Reviewed orally with patient, additionally signature is on file:Consent Form Botulism Toxin Injection For Chronic Migraine    Reviewed orally with patient, additionally signature is on file:  Botulism toxin has been approved by the Federal drug administration for treatment of chronic migraine. Botulism toxin does not cure chronic migraine and it may not be effective in some patients.  The administration of botulism toxin is accomplished by injecting a small amount of toxin into the muscles of the neck and head. Dosage must be titrated for each individual. Any benefits resulting from botulism toxin tend to wear off after 3 months with a repeat injection required if benefit is to be maintained. Injections are usually done every 3-4 months with maximum effect peak achieved by about 2 or 3 weeks. Botulism toxin is expensive and you should be sure of what costs you will incur resulting from the injection.  The side effects of botulism toxin use for chronic migraine may include:   -Transient, and usually mild, facial weakness with facial injections  -Transient, and usually mild, head or neck weakness with head/neck injections  -Reduction or loss of forehead facial animation due to forehead muscle weakness  -Eyelid drooping  -Dry eye  -Pain at the site of injection or bruising at the site of injection  -Double vision  -Potential unknown long term risks  Contraindications: You should not have Botox if you are  pregnant, nursing, allergic to albumin, have an infection, skin condition, or muscle weakness at the site of the injection, or have myasthenia gravis, Lambert-Eaton syndrome, or ALS.  It is also possible that as with any injection, there may be an allergic reaction or no effect from the medication. Reduced effectiveness after repeated injections is sometimes seen and rarely infection at the injection site may occur. All care will be taken to prevent these side effects. If therapy is given over a long time, atrophy and wasting in the muscle injected may occur. Occasionally the patient's become refractory to treatment because they develop antibodies to the toxin. In this event, therapy needs to be modified.  I have read the above information and consent to the administration of botulism toxin.    BOTOX PROCEDURE NOTE FOR MIGRAINE HEADACHE    Contraindications and precautions discussed with patient(above). Aseptic procedure was observed and patient tolerated procedure. Procedure performed by Dr. Artemio Aly  The condition has existed for more than 6 months, and pt does not have a diagnosis of ALS, Myasthenia Gravis or Lambert-Eaton Syndrome.  Risks and benefits of injections discussed and pt agrees to proceed with the procedure.  Written consent obtained  These injections are medically necessary. Pt  receives good benefits from these injections. These injections do not cause sedations or hallucinations which the oral therapies may cause.  Description of procedure:  The patient was placed in a sitting position. The standard protocol was used for Botox as follows, with 5 units of Botox injected at each site:   -Procerus muscle, midline injection  -Corrugator muscle, bilateral injection  -  Frontalis muscle, bilateral injection, with 2 sites each side, medial injection was performed in the upper one third of the frontalis muscle, in the region vertical from the medial inferior edge of the superior  orbital rim. The lateral injection was again in the upper one third of the forehead vertically above the lateral limbus of the cornea, 1.5 cm lateral to the medial injection site.  -Temporalis muscle injection, 4 sites, bilaterally. The first injection was 3 cm above the tragus of the ear, second injection site was 1.5 cm to 3 cm up from the first injection site in line with the tragus of the ear. The third injection site was 1.5-3 cm forward between the first 2 injection sites. The fourth injection site was 1.5 cm posterior to the second injection site.   -Occipitalis muscle injection, 3 sites, bilaterally. The first injection was done one half way between the occipital protuberance and the tip of the mastoid process behind the ear. The second injection site was done lateral and superior to the first, 1 fingerbreadth from the first injection. The third injection site was 1 fingerbreadth superiorly and medially from the first injection site.  -Cervical paraspinal muscle injection, 2 sites, bilateral knee first injection site was 1 cm from the midline of the cervical spine, 3 cm inferior to the lower border of the occipital protuberance. The second injection site was 1.5 cm superiorly and laterally to the first injection site.  -Trapezius muscle injection was performed at 3 sites, bilaterally. The first injection site was in the upper trapezius muscle halfway between the inflection point of the neck, and the acromion. The second injection site was one half way between the acromion and the first injection site. The third injection was done between the first injection site and the inflection point of the neck.   Will return for repeat injection in 3 months.   155  units of Botox was used, 45 U Botox not injected was wasted. The patient tolerated the procedure well, there were no complications of the above procedure.

## 2021-07-25 NOTE — Progress Notes (Signed)
Botox- 200 units x 1 vial Lot: [id number] Expiration: 12/2023 NDC: 0023-3921-02  Bacteriostatic 0.9% Sodium Chloride- 4mL total Lot: [id number] Expiration: 11/19/2022 NDC: 0409-1966-02  Dx: G43.711 B/B  

## 2021-08-02 ENCOUNTER — Other Ambulatory Visit: Payer: Self-pay | Admitting: Osteopathic Medicine

## 2021-08-02 DIAGNOSIS — G43409 Hemiplegic migraine, not intractable, without status migrainosus: Secondary | ICD-10-CM

## 2021-08-09 ENCOUNTER — Encounter: Payer: Self-pay | Admitting: Osteopathic Medicine

## 2021-08-09 DIAGNOSIS — G43409 Hemiplegic migraine, not intractable, without status migrainosus: Secondary | ICD-10-CM

## 2021-08-09 DIAGNOSIS — G43711 Chronic migraine without aura, intractable, with status migrainosus: Secondary | ICD-10-CM

## 2021-08-09 DIAGNOSIS — G43411 Hemiplegic migraine, intractable, with status migrainosus: Secondary | ICD-10-CM

## 2021-08-11 MED ORDER — OMEPRAZOLE 40 MG PO CPDR: 40.0000 mg | DELAYED_RELEASE_CAPSULE | Freq: Every day | ORAL | 1 refills | Status: DC

## 2021-08-11 MED ORDER — FLUOXETINE HCL 10 MG PO CAPS: 10.0000 mg | ORAL_CAPSULE | Freq: Every day | ORAL | 3 refills | Status: DC

## 2021-08-11 MED ORDER — NORETHINDRONE 0.35 MG PO TABS: 1.0000 | ORAL_TABLET | Freq: Every day | ORAL | 3 refills | Status: DC

## 2021-08-11 MED ORDER — FLUOXETINE HCL 20 MG PO CAPS: 20.0000 mg | ORAL_CAPSULE | Freq: Every day | ORAL | 3 refills | Status: DC

## 2021-08-11 MED ORDER — TRIAMCINOLONE ACETONIDE 0.1 % EX CREA: 1.0000 "application " | TOPICAL_CREAM | Freq: Two times a day (BID) | CUTANEOUS | 1 refills | Status: DC

## 2021-09-06 ENCOUNTER — Other Ambulatory Visit: Payer: Self-pay | Admitting: Neurology

## 2021-09-12 ENCOUNTER — Ambulatory Visit (INDEPENDENT_AMBULATORY_CARE_PROVIDER_SITE_OTHER): Payer: Medicare Other | Admitting: Family Medicine

## 2021-09-12 ENCOUNTER — Encounter: Payer: Self-pay | Admitting: Family Medicine

## 2021-09-12 VITALS — BP 119/84 | HR 105 | Temp 98.3°F | Wt 187.0 lb

## 2021-09-12 DIAGNOSIS — R5383 Other fatigue: Secondary | ICD-10-CM | POA: Diagnosis not present

## 2021-09-12 DIAGNOSIS — J029 Acute pharyngitis, unspecified: Secondary | ICD-10-CM

## 2021-09-12 DIAGNOSIS — L739 Follicular disorder, unspecified: Secondary | ICD-10-CM

## 2021-09-12 NOTE — Progress Notes (Signed)
Acute Office Visit  Subjective:    Patient ID: Holly [name], female    DOB: 02-27-1976, 45 y.o.   MRN: [id number]  Chief Complaint  Patient presents with   Sore Throat    HPI Patient is in today for sore throat.   Patient reports she first noticed a mild sore throat for the first week of September.  She took a home COVID test which was negative.  At that time she did not have any other symptoms apart from a temperature of 99.4 that lasted for about 2 days.  She thought this may be start of her allergy season, however no other symptoms developed.  She denies any cough, nasal congestion, rhinorrhea, chest pain, shortness of breath, ear pain.  She reports her sore throat tends to be mostly at the base of her neck, although she can feel discomfort in the entire throat.  It hurts extremely bad to swallow.  She has not been eating anything more than soft foods.  She has even had some trouble swallowing her pills recently.  She has been taking 800 mg of ibuprofen regularly and this is not touching her pain.  States she feels like the right side of the base of her neck seems slightly swollen.  She has tried cough syrup and cough drops to try to ease the sore throat but they have not helped.  Additional symptoms include significant fatigue over the past several weeks, although slightly improved compared to at the beginning.  She states is difficult to tell since she has a migraine related fatigue at baseline.  Additionally she reports steady weight gain over the past year, approximately 60 pounds despite trying to be very diligent with her diet and activity.  She has had decreased exercise tolerance lately.  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  She did recently take some steroids for a migraine and did not notice that improving her sore throat at all.  She states that she also recently noticed some tenderness under her  left axilla.  She has not found any lumps, no breast changes, no rashes.  Additionally she states she is prone to ingrown hairs in her bikini area.  She has had significantly more recently than she ever has.  States they are at her underwear line and into rubbing irritated.  She has had about 12 over the past few weeks.  States that seem to take them little bit longer to heal but they are healing fine now.  She denies any malodorous drainage, surrounding erythema, or induration at this time.   Son is on cancer treatment so being super cautious     Past Medical History:  Diagnosis Date   Depression    Eczema    scalp   Family history of adverse reaction to anesthesia    mother struggles with sob, slow to awaken, n/v bedridden for days   Fibromyalgia 2018   GERD (gastroesophageal reflux disease)    Hemiplegic migraine    IBS (irritable bowel syndrome)    Migraine    MRSA (methicillin resistant staph aureus) culture positive not sure when   Vaginal Pap smear, abnormal     Past Surgical History:  Procedure Laterality Date   CERVICAL CONIZATION W/BX N/A 07/05/2020   Procedure: COLD KNIFE CONIZATION CERVIX WITH BIOPSY; COLPOSCOPY AND ENDOCERVICAL CURRETAGE;  Surgeon: Conan Bowens, MD;  Location: [facility];  Service: Gynecology;  Laterality: N/A;   COLPOSCOPY     x 3 with biopsy on 1 done   LEEP  2020   MULTIPLE TOOTH EXTRACTIONS     as child   WRIST SURGERY Left 2010   cyst removal    Family History  Problem Relation Age of Onset   Thyroid disease Mother    Scoliosis Mother    Depression Mother    Migraines Mother    Hyperlipidemia Mother    Hypertension Mother    Heart attack Mother    Crohn's disease Father    Autoimmune disease Father    Factor V Leiden deficiency Father    Heart failure Father    Diabetes Father    Hyperlipidemia Father    Heart attack Father    Cancer Sister    Depression Sister    Diabetes Sister    Diabetes Paternal  Grandfather     Social History   Socioeconomic History   Marital status: Married    Spouse name: Vincenza Hews   Number of children: 6   Years of education: 15   Highest education level: Some college, no degree  Occupational History   Occupation: Arts development officer  Tobacco Use   Smoking status: Never   Smokeless tobacco: Never  Vaping Use   Vaping Use: Never used  Substance and Sexual Activity   Alcohol use: No    Alcohol/week: 0.0 standard drinks    Comment: none   Drug use: Never   Sexual activity: Yes    Birth control/protection: Pill  Other Topics Concern   Not on file  Social History Narrative   1 caffeine drink a day at max   Right handed   Lives at home with children. Husband is a Naval architect and rarely home.   Has a lot of migraines   Social Determinants of Health   Financial Resource Strain: Not on file  Food Insecurity: Not on file  Transportation Needs: Not on file  Physical Activity: Not on file  Stress: Not on file  Social Connections: Not on file  Intimate Partner Violence: Not on file    Outpatient Medications Prior to Visit  Medication Sig Dispense Refill   ALLERGY RELIEF/NASAL DECONGEST 10-240 MG 24 hr tablet TAKE ONE TABLET BY MOUTH DAILY 30 tablet 11   butalbital-acetaminophen-caffeine (FIORICET) 50-325-40 MG tablet Take 1-2 tablets by mouth every 6 (six) hours as needed for headache. 15 tablet 5   FLUoxetine (PROZAC) 10 MG capsule Take 1 capsule (10 mg total) by mouth daily. Take with 20 mg capsules for total daily dose 30 mg 90 capsule 3   FLUoxetine (PROZAC) 20 MG capsule Take 1 capsule (20 mg total) by mouth daily. 90 capsule 3   HYDROcodone-acetaminophen (NORCO/VICODIN) 5-325 MG tablet Take 1 tablet by mouth every 6 (six) hours as needed for moderate pain. 60 tablet 0   ibuprofen (ADVIL) 800 MG tablet Take 1 tablet (800 mg total) by mouth 3 (three) times daily with meals as needed for headache, moderate pain or cramping. 90 tablet 1   ketorolac (TORADOL)  60 MG/2ML SOLN injection INJECT 60 MG(2 ML) INTO THE MUSCLE EVERY 6 HOURS AS NEEDED FOR MIGRAINE. MAXIMUM DAILY DOSE IS 120 MG. DO NOT USE MORE THAN 4 DAYS IN A MONTH. 8 mL 3   methocarbamol (ROBAXIN) 500 MG tablet Take 1 tablet (500 mg total) by mouth every 6 (six) hours as needed for muscle spasms. 120 tablet 6   methylPREDNISolone (MEDROL DOSEPAK) 4 MG  TBPK tablet Take pills by mouth daily with food in the morning for 6 days 21 tablet 1   NEEDLE, DISP, 23 G 23G X 1" MISC Attach to syringe to draw up and administer Toradol into the muscle. 4 each 6   norethindrone (MICRONOR) 0.35 MG tablet Take 1 tablet (0.35 mg total) by mouth daily. 84 tablet 3   omeprazole (PRILOSEC) 40 MG capsule Take 1 capsule (40 mg total) by mouth daily. 90 capsule 1   ondansetron (ZOFRAN-ODT) 8 MG disintegrating tablet Take 1 tablet (8 mg total) by mouth every 8 (eight) hours as needed for nausea. 30 tablet 11   prochlorperazine (COMPAZINE) 10 MG tablet Take 1 tablet (10 mg total) by mouth every 6 (six) hours as needed. 30 tablet 11   promethazine (PHENERGAN) 25 MG suppository Place 1 suppository (25 mg total) rectally every 6 (six) hours as needed for nausea or vomiting. 12 each 11   Syringe, Disposable, (B-D SYRINGE LUER-LOK 3CC) 3 ML MISC Attach to needle to draw up and administer Toradol into the muscle. 4 each 6   SYRINGE-NEEDLE, DISP, 3 ML (B-D 3CC LUER-LOK SYR 23GX1") 23G X 1" 3 ML MISC Attach to syringe to draw up and administer Toradol into the muscle. 4 each 6   Topiramate ER (TROKENDI XR) 100 MG CP24 Take 1 capsule by mouth at bedtime. 90 capsule 3   triamcinolone cream (KENALOG) 0.1 % Apply 1 application topically 2 (two) times daily. To affected area(s) as needed 45 g 1   Galcanezumab-gnlm (EMGALITY) 120 MG/ML SOAJ Inject 120 mg into the skin every 30 (thirty) days. (Patient not taking: Reported on 09/12/2021) 3 mL 6   No facility-administered medications prior to visit.    Allergies  Allergen Reactions    Other Rash    Overly sleepy zyrtec   Terbutaline Palpitations   Emgality [Galcanezumab-Gnlm] Other (See Comments)    Per patient, rx now gives her GI problems and causes IBS symptoms to worsen.    Sumatriptan    Adhesive [Tape] Itching and Rash   Aimovig [Erenumab-Aooe] Rash   Celebrex [Celecoxib] Rash    03/17/18 patient is unsure, thinks it may have been shaving cream   Cetirizine Hcl Other (See Comments)    Overly sleepy    Ciprofloxacin Rash    Swelling of knees    Review of Systems All review of systems negative except what is listed in the HPI     Objective:    Physical Exam Vitals reviewed.  Constitutional:      General: She is not in acute distress.    Appearance: She is well-developed. She is not ill-appearing.  HENT:     Head: Normocephalic and atraumatic.     Right Ear: Tympanic membrane and ear canal normal.     Left Ear: Tympanic membrane and ear canal normal.     Nose: No congestion or rhinorrhea.     Mouth/Throat:     Mouth: Mucous membranes are dry.     Pharynx: Oropharynx is clear. No pharyngeal swelling, oropharyngeal exudate or posterior oropharyngeal erythema.     Tonsils: No tonsillar exudate or tonsillar abscesses.  Eyes:     Conjunctiva/sclera: Conjunctivae normal.  Neck:     Thyroid: No thyromegaly.  Cardiovascular:     Rate and Rhythm: Normal rate and regular rhythm.  Pulmonary:     Effort: Pulmonary effort is normal.     Breath sounds: Normal breath sounds.  Abdominal:     General: Bowel sounds are  normal.     Palpations: Abdomen is soft.  Musculoskeletal:     Cervical back: Normal range of motion and neck supple.  Lymphadenopathy:     Cervical: No cervical adenopathy.  Skin:    General: Skin is warm and dry.     Findings: No rash.     Comments: Healing folliculitis to bikini line, no signs of abscess  Neurological:     General: No focal deficit present.     Mental Status: She is alert and oriented to person, place, and time.   Psychiatric:        Mood and Affect: Mood normal.        Behavior: Behavior normal.    BP 119/84 (BP Location: Left Arm, Patient Position: Sitting, Cuff Size: Normal)   Pulse (!) 105   Temp 98.3 F (36.8 C) (Oral)   Wt 187 lb (84.8 kg)   BMI 29.29 kg/m  Wt Readings from Last 3 Encounters:  09/12/21 187 lb (84.8 kg)  01/31/21 181 lb (82.1 kg)  01/19/21 177 lb (80.3 kg)    Health Maintenance Due  Topic Date Due   Hepatitis C Screening  Never done    There are no preventive care reminders to display for this patient.   Lab Results  Component Value Date   TSH 1.08 03/16/2021   Lab Results  Component Value Date   WBC 7.7 03/16/2021   HGB 13.0 03/16/2021   HCT 37.9 03/16/2021   MCV 91.1 03/16/2021   PLT 247 03/16/2021   Lab Results  Component Value Date   NA 137 03/16/2021   K 3.9 03/16/2021   CO2 25 03/16/2021   GLUCOSE 95 03/16/2021   BUN 11 03/16/2021   CREATININE 0.97 03/16/2021   BILITOT 0.6 03/16/2021   ALKPHOS 88 12/20/2020   AST 14 03/16/2021   ALT 10 03/16/2021   PROT 7.4 03/16/2021   ALBUMIN 4.5 12/20/2020   CALCIUM 9.2 03/16/2021   Lab Results  Component Value Date   CHOL 160 03/16/2021   Lab Results  Component Value Date   HDL 35 (L) 03/16/2021   Lab Results  Component Value Date   LDLCALC 103 (H) 03/16/2021   Lab Results  Component Value Date   TRIG 127 03/16/2021   Lab Results  Component Value Date   CHOLHDL 4.6 03/16/2021   No results found for: HGBA1C     Assessment & Plan:   1. Sore throat 2. Fatigue, unspecified type No alarm findings on physical exam. Starting conservatively with blood work. Pending results and symptom progression, consider imaging and/or ENT referral. Continue supportive measures - rest, hydration, humidifier use, warm liquids, voice rest, OTC cough/cold/analgesics as needed. Patient aware of signs/symptoms requiring further/urgent evaluation.   - TSH + free T4 - CBC - Epstein-Barr Virus VCA  Antibody Panel   3. Folliculitis Seems to be improving. Recommend warm compresses/soaks, good hygiene, neosporin topically, continue to avoid shaving this area. Patient aware of signs/symptoms requiring further/urgent evaluation.    Follow-up pending results or as needed. Please contact office for sooner follow-up if symptoms do not improve or worsen. Seek emergency care if symptoms become severe.    Clayborne Dana, NP

## 2021-09-12 NOTE — Patient Instructions (Signed)
Let's start with blood work. We can consider ENT referral if blood work doesn't show anything.  Continue supportive measures.

## 2021-09-13 LAB — CBC
HCT: 36.3 % (ref 35.0–45.0)
Hemoglobin: 12.2 g/dL (ref 11.7–15.5)
MCH: 31.1 pg (ref 27.0–33.0)
MCHC: 33.6 g/dL (ref 32.0–36.0)
MCV: 92.6 fL (ref 80.0–100.0)
MPV: 8.7 fL (ref 7.5–12.5)
Platelets: 256 10*3/uL (ref 140–400)
RBC: 3.92 10*6/uL (ref 3.80–5.10)
RDW: 13 % (ref 11.0–15.0)
WBC: 8 10*3/uL (ref 3.8–10.8)

## 2021-09-13 LAB — TSH+FREE T4: TSH W/REFLEX TO FT4: 0.63 mIU/L

## 2021-09-13 LAB — EPSTEIN-BARR VIRUS VCA ANTIBODY PANEL
EBV NA IgG: <18 U/mL
EBV VCA IgG: 201 U/mL — ABNORMAL HIGH
EBV VCA IgM: <36 U/mL

## 2021-10-03 ENCOUNTER — Ambulatory Visit: Payer: Medicare Other | Admitting: Neurology

## 2021-10-04 ENCOUNTER — Other Ambulatory Visit: Payer: Self-pay

## 2021-10-04 ENCOUNTER — Ambulatory Visit (INDEPENDENT_AMBULATORY_CARE_PROVIDER_SITE_OTHER): Payer: Medicare Other | Admitting: Medical-Surgical

## 2021-10-04 ENCOUNTER — Encounter: Payer: Self-pay | Admitting: Medical-Surgical

## 2021-10-04 VITALS — BP 120/86 | HR 107 | Resp 20 | Ht 67.0 in | Wt 187.0 lb

## 2021-10-04 DIAGNOSIS — L02214 Cutaneous abscess of groin: Secondary | ICD-10-CM | POA: Diagnosis not present

## 2021-10-04 MED ORDER — DOXYCYCLINE HYCLATE 100 MG PO TABS: 100.0000 mg | ORAL_TABLET | Freq: Two times a day (BID) | ORAL | 0 refills | Status: AC

## 2021-10-04 NOTE — Progress Notes (Signed)
  HPI with pertinent ROS:   CC: Cyst  HPI: Pleasant 45 year old female presenting today for evaluation of a right groin cyst that has been present for several weeks.  She was seen by Hyman Hopes, NP on 10/25 and mentioned the cyst was present but it had started to heal.  There is no significant treatment for the cyst at that time but since then, she has noticed that the area has gotten slightly erythematous and has started draining cloudy blood-tinged fluid.  Also notes that there is a hardened area painful to touch at the site of the cyst.  She has not had any antibiotics to treat this and has been using topical Neosporin and warm compresses at home.  Denies fever, chills, and myalgias.  I reviewed the past medical history, family history, social history, surgical history, and allergies today and no changes were needed.  Please see the problem list section below in epic for further details.   Physical exam:   General: Well Developed, well nourished, and in no acute distress.  Neuro: Alert and oriented x3.  HEENT: Normocephalic, atraumatic.  Skin: Warm and dry.  Slightly erythematous area to the right groin along the panty line.  No current drainage noted but some induration palpable in the area.  Also notable for tenderness and enlargement of a lymph node adjacent to the cyst. Cardiac: Regular rate and rhythm, no murmurs rubs or gallops, no lower extremity edema.  Respiratory: Clear to auscultation bilaterally. Not using accessory muscles, speaking in full sentences.  Impression and Recommendations:    1. Abscess of groin, right Treating with doxycycline 100 mg twice daily x7 days.  Continue warm compresses 15 minutes 4 times daily at home.  Use Tylenol and ibuprofen as needed for discomfort.    Return if symptoms worsen or fail to improve. ___________________________________________ Thayer Ohm, DNP, APRN, FNP-BC Primary Care and Sports Medicine [facility] [place]

## 2021-10-11 ENCOUNTER — Other Ambulatory Visit: Payer: Self-pay | Admitting: Osteopathic Medicine

## 2021-10-11 ENCOUNTER — Encounter: Payer: Self-pay | Admitting: Medical-Surgical

## 2021-10-11 DIAGNOSIS — J302 Other seasonal allergic rhinitis: Secondary | ICD-10-CM

## 2021-10-17 ENCOUNTER — Ambulatory Visit (INDEPENDENT_AMBULATORY_CARE_PROVIDER_SITE_OTHER): Payer: Self-pay | Admitting: Medical-Surgical

## 2021-10-17 DIAGNOSIS — Z91199 Patient's noncompliance with other medical treatment and regimen due to unspecified reason: Secondary | ICD-10-CM

## 2021-10-17 NOTE — Progress Notes (Signed)
   Complete physical exam  Patient: Holly [name]   DOB: 09/08/1997   45 y.o. Female  MRN: [id number]  Subjective:    No chief complaint on file.   Holly [name] is a 45 y.o. female who presents today for a complete physical exam. She reports consuming a {diet types:17450} diet. {types:19826} She generally feels {DESC; WELL/FAIRLY WELL/POORLY:18703}. She reports sleeping {DESC; WELL/FAIRLY WELL/POORLY:18703}. She {does/does not:200015} have additional problems to discuss today.    Most recent fall risk assessment:    05/16/2022   10:42 AM  Fall Risk   Falls in the past year? 0  Number falls in past yr: 0  Injury with Fall? 0  Risk for fall due to : No Fall Risks  Follow up Falls evaluation completed     Most recent depression screenings:    05/16/2022   10:42 AM 04/06/2021   10:46 AM  PHQ 2/9 Scores  PHQ - 2 Score 0 0  PHQ- 9 Score 5     {VISON DENTAL STD PSA (Optional):27386}  {History (Optional):23778}  Patient Care Team: Janila Arrazola, NP as PCP - General (Nurse Practitioner)   Outpatient Medications Prior to Visit  Medication Sig   fluticasone (FLONASE) 50 MCG/ACT nasal spray Place 2 sprays into both nostrils in the morning and at bedtime. After 7 days, reduce to once daily.   norgestimate-ethinyl estradiol (SPRINTEC 28) 0.25-35 MG-MCG tablet Take 1 tablet by mouth daily.   Nystatin POWD Apply liberally to affected area 2 times per day   spironolactone (ALDACTONE) 100 MG tablet Take 1 tablet (100 mg total) by mouth daily.   No facility-administered medications prior to visit.    ROS        Objective:     There were no vitals taken for this visit. {Vitals History (Optional):23777}  Physical Exam   No results found for any visits on 06/21/22. {Show previous labs (optional):23779}    Assessment & Plan:    Routine Health Maintenance and Physical Exam  Immunization History  Administered Date(s) Administered   DTaP 11/22/1999, 01/18/2000,  03/28/2000, 12/12/2000, 06/27/2004   Hepatitis A 04/23/2008, 04/29/2009   Hepatitis B 09/09/1999, 10/17/1999, 03/28/2000   HiB (PRP-OMP) 11/22/1999, 01/18/2000, 03/28/2000, 12/12/2000   IPV 11/22/1999, 01/18/2000, 09/16/2000, 06/27/2004   Influenza,inj,Quad PF,6+ Mos 07/30/2014   Influenza-Unspecified 10/29/2012   MMR 09/16/2001, 06/27/2004   Meningococcal Polysaccharide 04/28/2012   Pneumococcal Conjugate-13 12/12/2000   Pneumococcal-Unspecified 03/28/2000, 06/11/2000   Tdap 04/28/2012   Varicella 09/16/2000, 04/23/2008    Health Maintenance  Topic Date Due   HIV Screening  Never done   Hepatitis C Screening  Never done   INFLUENZA VACCINE  06/19/2022   PAP-Cervical Cytology Screening  06/21/2022 (Originally 09/07/2020)   PAP SMEAR-Modifier  06/21/2022 (Originally 09/07/2020)   TETANUS/TDAP  06/21/2022 (Originally 04/28/2022)   HPV VACCINES  Discontinued   COVID-19 Vaccine  Discontinued    Discussed health benefits of physical activity, and encouraged her to engage in regular exercise appropriate for her age and condition.  Problem List Items Addressed This Visit   None Visit Diagnoses     Annual physical exam    -  Primary   Cervical cancer screening       Need for Tdap vaccination          No follow-ups on file.     Siddharth Babington, NP   

## 2021-10-24 ENCOUNTER — Ambulatory Visit (INDEPENDENT_AMBULATORY_CARE_PROVIDER_SITE_OTHER): Payer: Medicare Other | Admitting: Neurology

## 2021-10-24 DIAGNOSIS — G43711 Chronic migraine without aura, intractable, with status migrainosus: Secondary | ICD-10-CM | POA: Diagnosis not present

## 2021-10-24 MED ORDER — "NEEDLE (DISP) 23G X 1"" MISC": 6 refills | Status: DC

## 2021-10-24 MED ORDER — BD SYRINGE LUER-LOK 3 ML MISC: 6 refills | Status: DC

## 2021-10-24 MED ORDER — METHYLPREDNISOLONE 4 MG PO TBPK: ORAL_TABLET | ORAL | 1 refills | Status: DC

## 2021-10-24 MED ORDER — KETOROLAC TROMETHAMINE 60 MG/2ML IM SOLN: INTRAMUSCULAR | 11 refills | Status: DC

## 2021-10-24 MED ORDER — HYDROCODONE-ACETAMINOPHEN 5-325 MG PO TABS: 1.0000 | ORAL_TABLET | Freq: Four times a day (QID) | ORAL | 0 refills | Status: DC | PRN

## 2021-10-24 MED ORDER — AZITHROMYCIN 250 MG PO TABS: ORAL_TABLET | ORAL | 0 refills | Status: DC

## 2021-10-24 NOTE — Telephone Encounter (Signed)
Patient is coming in today for Botox. I obtained Botox PA from [facility]. Dx: J88.325. PA was approved. PA #[id number] (10/24/21- 10/24/22).

## 2021-10-24 NOTE — Progress Notes (Signed)
Consent Form Botulism Toxin Injection For Chronic Migraine    10/24/2021: Doing extremely well on botox > 70% improvement in frequency and severity of migraines and headaches per patient today.Ubrelvy caused stomach discomfort. Lasmiditan did not work. Tried Nurtec. Continue Fioricet, only acute med that works. She is not a clencher and she has no pain behind her eyes. We try to position the occipital and cervical injections "around the pony tail area" and at occipitalis more using "follow the pain protocol"     Reviewed orally with patient, additionally signature is on file:Consent Form Botulism Toxin Injection For Chronic Migraine    Reviewed orally with patient, additionally signature is on file:  Botulism toxin has been approved by the Federal drug administration for treatment of chronic migraine. Botulism toxin does not cure chronic migraine and it may not be effective in some patients.  The administration of botulism toxin is accomplished by injecting a small amount of toxin into the muscles of the neck and head. Dosage must be titrated for each individual. Any benefits resulting from botulism toxin tend to wear off after 3 months with a repeat injection required if benefit is to be maintained. Injections are usually done every 3-4 months with maximum effect peak achieved by about 2 or 3 weeks. Botulism toxin is expensive and you should be sure of what costs you will incur resulting from the injection.  The side effects of botulism toxin use for chronic migraine may include:   -Transient, and usually mild, facial weakness with facial injections  -Transient, and usually mild, head or neck weakness with head/neck injections  -Reduction or loss of forehead facial animation due to forehead muscle weakness  -Eyelid drooping  -Dry eye  -Pain at the site of injection or bruising at the site of injection  -Double vision  -Potential unknown long term risks  Contraindications: You  should not have Botox if you are pregnant, nursing, allergic to albumin, have an infection, skin condition, or muscle weakness at the site of the injection, or have myasthenia gravis, Lambert-Eaton syndrome, or ALS.  It is also possible that as with any injection, there may be an allergic reaction or no effect from the medication. Reduced effectiveness after repeated injections is sometimes seen and rarely infection at the injection site may occur. All care will be taken to prevent these side effects. If therapy is given over a long time, atrophy and wasting in the muscle injected may occur. Occasionally the patient's become refractory to treatment because they develop antibodies to the toxin. In this event, therapy needs to be modified.  I have read the above information and consent to the administration of botulism toxin.    BOTOX PROCEDURE NOTE FOR MIGRAINE HEADACHE    Contraindications and precautions discussed with patient(above). Aseptic procedure was observed and patient tolerated procedure. Procedure performed by Dr. Artemio Aly  The condition has existed for more than 6 months, and pt does not have a diagnosis of ALS, Myasthenia Gravis or Lambert-Eaton Syndrome.  Risks and benefits of injections discussed and pt agrees to proceed with the procedure.  Written consent obtained  These injections are medically necessary. Pt  receives good benefits from these injections. These injections do not cause sedations or hallucinations which the oral therapies may cause.  Description of procedure:  The patient was placed in a sitting position. The standard protocol was used for Botox as follows, with 5 units of Botox injected at each site:   -Procerus muscle, midline injection  -Corrugator  muscle, bilateral injection  -Frontalis muscle, bilateral injection, with 2 sites each side, medial injection was performed in the upper one third of the frontalis muscle, in the region vertical from the  medial inferior edge of the superior orbital rim. The lateral injection was again in the upper one third of the forehead vertically above the lateral limbus of the cornea, 1.5 cm lateral to the medial injection site.  -Temporalis muscle injection, 4 sites, bilaterally. The first injection was 3 cm above the tragus of the ear, second injection site was 1.5 cm to 3 cm up from the first injection site in line with the tragus of the ear. The third injection site was 1.5-3 cm forward between the first 2 injection sites. The fourth injection site was 1.5 cm posterior to the second injection site.   -Occipitalis muscle injection, 3 sites, bilaterally. The first injection was done one half way between the occipital protuberance and the tip of the mastoid process behind the ear. The second injection site was done lateral and superior to the first, 1 fingerbreadth from the first injection. The third injection site was 1 fingerbreadth superiorly and medially from the first injection site.  -Cervical paraspinal muscle injection, 2 sites, bilateral knee first injection site was 1 cm from the midline of the cervical spine, 3 cm inferior to the lower border of the occipital protuberance. The second injection site was 1.5 cm superiorly and laterally to the first injection site.  -Trapezius muscle injection was performed at 3 sites, bilaterally. The first injection site was in the upper trapezius muscle halfway between the inflection point of the neck, and the acromion. The second injection site was one half way between the acromion and the first injection site. The third injection was done between the first injection site and the inflection point of the neck.   Will return for repeat injection in 3 months.   155  units of Botox was used, 45 U Botox not injected was wasted. The patient tolerated the procedure well, there were no complications of the above procedure.

## 2021-10-24 NOTE — Progress Notes (Signed)
Botox- 200 units x 1 vial Lot: [id number] Expiration: 03/2024 NDC: 5053-9767-34  Bacteriostatic 0.9% Sodium Chloride- 59mL total Lot: [id number] Expiration: 11/19/2022 NDC: 2409-7353-29  Dx: F43.711 B/B

## 2021-10-28 ENCOUNTER — Other Ambulatory Visit: Payer: Self-pay | Admitting: Neurology

## 2021-11-13 ENCOUNTER — Other Ambulatory Visit: Payer: Self-pay | Admitting: Neurology

## 2021-11-13 MED ORDER — AZITHROMYCIN 250 MG PO TABS: ORAL_TABLET | ORAL | 0 refills | Status: DC

## 2021-11-14 ENCOUNTER — Other Ambulatory Visit: Payer: Self-pay | Admitting: Neurology

## 2021-11-14 MED ORDER — AZITHROMYCIN 250 MG PO TABS: ORAL_TABLET | ORAL | 0 refills | Status: DC

## 2021-11-16 ENCOUNTER — Other Ambulatory Visit: Payer: Self-pay | Admitting: Neurology

## 2021-12-04 ENCOUNTER — Other Ambulatory Visit: Payer: Self-pay

## 2021-12-04 ENCOUNTER — Encounter: Payer: Self-pay | Admitting: Physician Assistant

## 2021-12-04 ENCOUNTER — Ambulatory Visit (INDEPENDENT_AMBULATORY_CARE_PROVIDER_SITE_OTHER): Payer: Medicare Other

## 2021-12-04 ENCOUNTER — Ambulatory Visit (INDEPENDENT_AMBULATORY_CARE_PROVIDER_SITE_OTHER): Payer: Medicare Other | Admitting: Physician Assistant

## 2021-12-04 VITALS — BP 155/72 | HR 105 | Ht 67.0 in | Wt 182.0 lb

## 2021-12-04 DIAGNOSIS — R1013 Epigastric pain: Secondary | ICD-10-CM

## 2021-12-04 DIAGNOSIS — R110 Nausea: Secondary | ICD-10-CM

## 2021-12-04 NOTE — Progress Notes (Signed)
Subjective:    Patient ID: Holly [name], female    DOB: Jun 21, 1976, 46 y.o.   MRN: [id number]  HPI Pt is a 46 yo overweight female with Migraines and gERD who presents to the clinic with epigastric fullness and discomfort that woke her up last night. She has had this once before on new years eve. She denies any alcohol or food triggers. When happens last for a few days and slowly goes away. Pain does radiate through into her back. She is on prilosec daily for GERD. No stool changes. No new medications. She is nauseated but no vomiting. She tried previously anti-nausea medications and stool softeners with little relief. Not taking ibuprofen/toradol regularly.   Pt does have her gallbladder.    .. 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   Fibromyalgia syndrome 09/30/2017   Menometrorrhagia 01/20/2018   Dysmenorrhea 01/20/2018   Rib pain on right side 02/26/2018   Vitamin D deficiency 11/30/2019   Low HDL (under 40) 11/30/2019   Carcinoma in situ of exocervix    Injury of foot, left 11/29/2020   Resolved Ambulatory Problems    Diagnosis Date Noted   GERD 04/27/2010   WOUND OPEN, KNEE/LEG/ANKLE W/O COMPLICATION 04/27/2010   Normal labor 07/07/2015   NSVD (normal spontaneous vaginal delivery) 07/07/2015   Past Medical History:  Diagnosis Date   Depression    Eczema    Family history of adverse reaction to anesthesia    Fibromyalgia 2018   GERD (gastroesophageal reflux disease)    IBS (irritable bowel syndrome)    Migraine    MRSA (methicillin resistant staph aureus) culture positive not sure when   Vaginal Pap smear, abnormal      Review of  Systems    See HPI.  Objective:   Physical Exam Vitals reviewed.  Constitutional:      Appearance: She is well-developed.  HENT:     Head: Normocephalic.  Abdominal:     General: Bowel sounds are normal. There is no distension.     Palpations: Abdomen is soft. There is no hepatomegaly or mass.     Tenderness: There is abdominal tenderness in the epigastric area. There is rebound. There is no right CVA tenderness, left CVA tenderness or guarding. Negative signs include Murphy's sign, McBurney's sign and psoas sign.     Hernia: No hernia is present.  Neurological:     Mental Status: She is alert and oriented to person, place, and time.  Psychiatric:        Mood and Affect: Mood normal.          Assessment & Plan:  Marland KitchenMarland KitchenMaylana was seen today for abdominal pain.  Diagnoses and all orders for this visit:  Epigastric discomfort -     COMPLETE METABOLIC PANEL WITH GFR -     Lipase -     CBC with Differential/Platelet -     US Abdomen Complete; Future  Nausea -     COMPLETE METABOLIC PANEL WITH GFR -     Lipase -     CBC with Differential/Platelet -     US Abdomen Complete; Future   Unclear etiology gastritis vs hiatal hernia vs cholestasis vs pancreatitis.  Will check  CMP/CBC/lipase Will get abdominal u/s Not classic gallbladder symptoms  Will call with results.  Could be hiatal hernia and need to increase PPI and maybe add carafate for a few days Keep GERD diet, avoid alcohol and NSAIDs.  No red flag signs but encouraged pt to watch out for any blood in stool.

## 2021-12-05 ENCOUNTER — Other Ambulatory Visit: Payer: Self-pay | Admitting: Physician Assistant

## 2021-12-05 DIAGNOSIS — R748 Abnormal levels of other serum enzymes: Secondary | ICD-10-CM

## 2021-12-05 DIAGNOSIS — R1013 Epigastric pain: Secondary | ICD-10-CM

## 2021-12-05 DIAGNOSIS — K8020 Calculus of gallbladder without cholecystitis without obstruction: Secondary | ICD-10-CM

## 2021-12-05 DIAGNOSIS — K824 Cholesterolosis of gallbladder: Secondary | ICD-10-CM

## 2021-12-05 NOTE — Progress Notes (Signed)
Your liver enzymes are fairly elevated. We are going to add acute hepatitis panel to these labs. U/S does show gallstones and gallbladder polyp but not acutely inflamed or block so does not support elevated liver enzymes.  I am going to go ahead and refer you to general surgery to consult about stones/polyp.  Recheck liver enzymes on Friday as well. We may need to get more imaging if continues to be elevated.

## 2021-12-05 NOTE — Progress Notes (Signed)
Referral made to general surgery for polyp/stone discussion.

## 2021-12-07 ENCOUNTER — Encounter: Payer: Self-pay | Admitting: Physician Assistant

## 2021-12-07 LAB — CBC WITH DIFFERENTIAL/PLATELET
Absolute Monocytes: 802 cells/uL (ref 200–950)
Basophils Absolute: 12 cells/uL (ref 0–200)
Basophils Relative: 0.1 %
Eosinophils Absolute: 47 cells/uL (ref 15–500)
Eosinophils Relative: 0.4 %
HCT: 36.9 % (ref 35.0–45.0)
Hemoglobin: 12.6 g/dL (ref 11.7–15.5)
Lymphs Abs: 2454 cells/uL (ref 850–3900)
MCH: 30.7 pg (ref 27.0–33.0)
MCHC: 34.1 g/dL (ref 32.0–36.0)
MCV: 89.8 fL (ref 80.0–100.0)
MPV: 8.5 fL (ref 7.5–12.5)
Monocytes Relative: 6.8 %
Neutro Abs: 8484 cells/uL — ABNORMAL HIGH (ref 1500–7800)
Neutrophils Relative %: 71.9 %
Platelets: 296 10*3/uL (ref 140–400)
RBC: 4.11 10*6/uL (ref 3.80–5.10)
RDW: 12.5 % (ref 11.0–15.0)
Total Lymphocyte: 20.8 %
WBC: 11.8 10*3/uL — ABNORMAL HIGH (ref 3.8–10.8)

## 2021-12-07 LAB — COMPLETE METABOLIC PANEL WITH GFR
AG Ratio: 1.4 (calc) (ref 1.0–2.5)
ALT: 94 U/L — ABNORMAL HIGH (ref 6–29)
AST: 210 U/L — ABNORMAL HIGH (ref 10–30)
Albumin: 4.3 g/dL (ref 3.6–5.1)
Alkaline phosphatase (APISO): 143 U/L — ABNORMAL HIGH (ref 31–125)
BUN/Creatinine Ratio: 8 (calc) (ref 6–22)
BUN: 8 mg/dL (ref 7–25)
CO2: 26 mmol/L (ref 20–32)
Calcium: 9.5 mg/dL (ref 8.6–10.2)
Chloride: 109 mmol/L (ref 98–110)
Creat: 1.02 mg/dL — ABNORMAL HIGH (ref 0.50–0.99)
Globulin: 3 g/dL (calc) (ref 1.9–3.7)
Glucose, Bld: 77 mg/dL (ref 65–99)
Potassium: 4.4 mmol/L (ref 3.5–5.3)
Sodium: 141 mmol/L (ref 135–146)
Total Bilirubin: 0.6 mg/dL (ref 0.2–1.2)
Total Protein: 7.3 g/dL (ref 6.1–8.1)
eGFR: 70 mL/min/{1.73_m2} (ref >=60)

## 2021-12-07 LAB — ACUTE HEP PANEL AND HEP B SURFACE AB
HEPATITIS C ANTIBODY REFILL$(REFL): NONREACTIVE
Hep A IgM: NONREACTIVE
Hep B C IgM: NONREACTIVE
Hepatitis B Surface Ag: NONREACTIVE
SIGNAL TO CUT-OFF: 0.14 (ref <1.00)

## 2021-12-07 LAB — REFLEX TIQ

## 2021-12-07 LAB — LIPASE: Lipase: 20 U/L (ref 7–60)

## 2021-12-08 NOTE — Progress Notes (Signed)
Hepatitis panel is negative.

## 2021-12-27 ENCOUNTER — Other Ambulatory Visit: Payer: Self-pay | Admitting: Neurology

## 2021-12-27 ENCOUNTER — Telehealth: Payer: Self-pay | Admitting: *Deleted

## 2021-12-27 NOTE — Telephone Encounter (Signed)
We received a refill request for methylprednisolone 4 mg dose pack. This was just refilled today and then recently as well. I called the pharmacy and was told the patient has refilled the steroid every month since July 2021.

## 2021-12-27 NOTE — Telephone Encounter (Signed)
Need to discuss with Dr Jaynee Eagles.

## 2021-12-28 IMAGING — MG DIGITAL SCREENING BILAT W/ TOMO W/ CAD
8 series · 8 of 24 positions shown · non-contrast
Comparison: Previous exam(s).

CLINICAL DATA: Screening.

EXAM:
DIGITAL SCREENING BILATERAL MAMMOGRAM WITH TOMO AND CAD

[L MLO synth-2D]
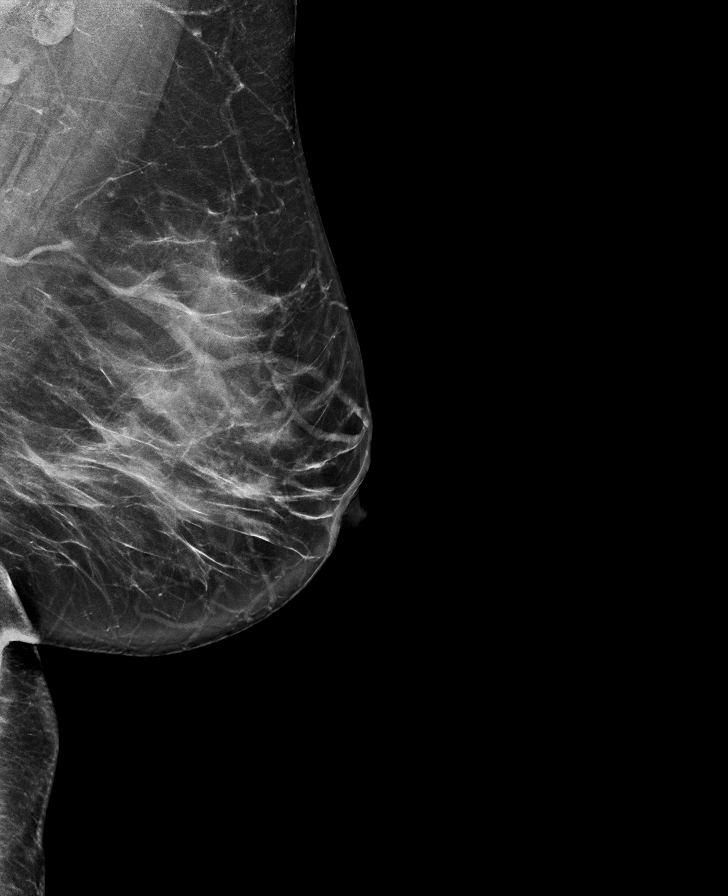

[R MLO synth-2D]
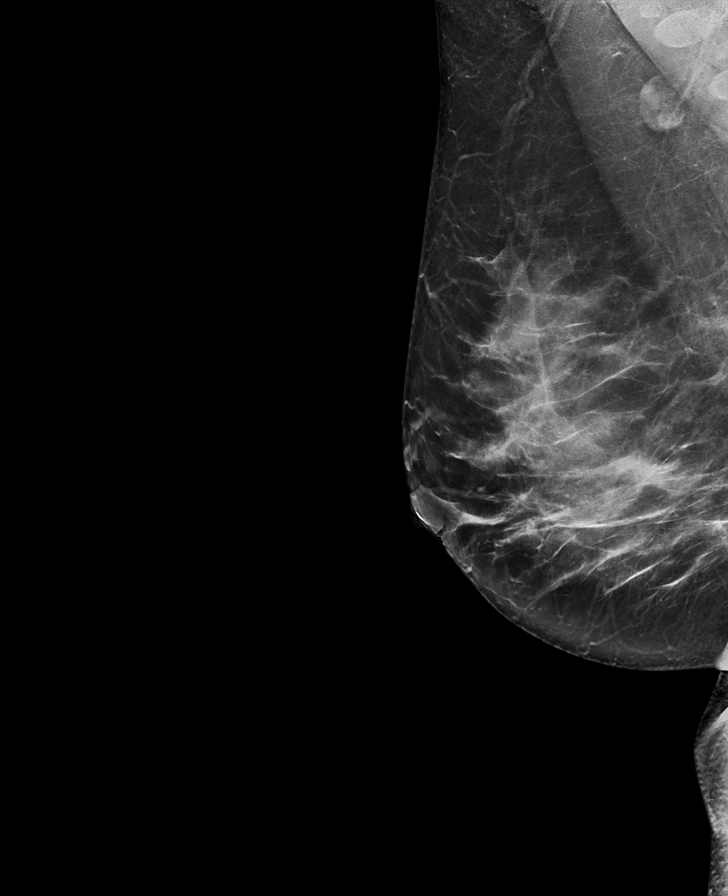

[L CC synth-2D]
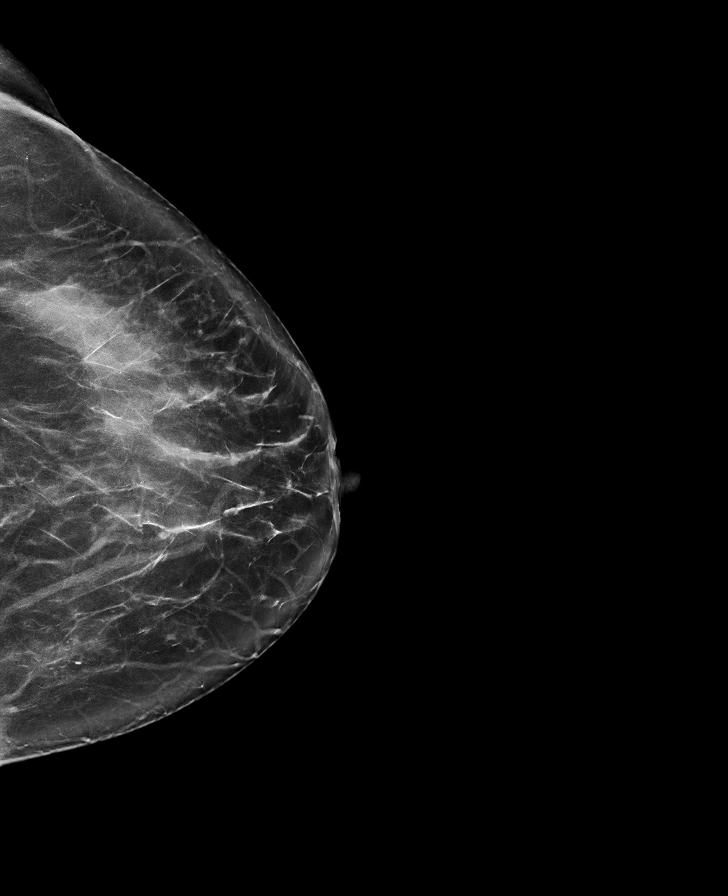

[R CC synth-2D]
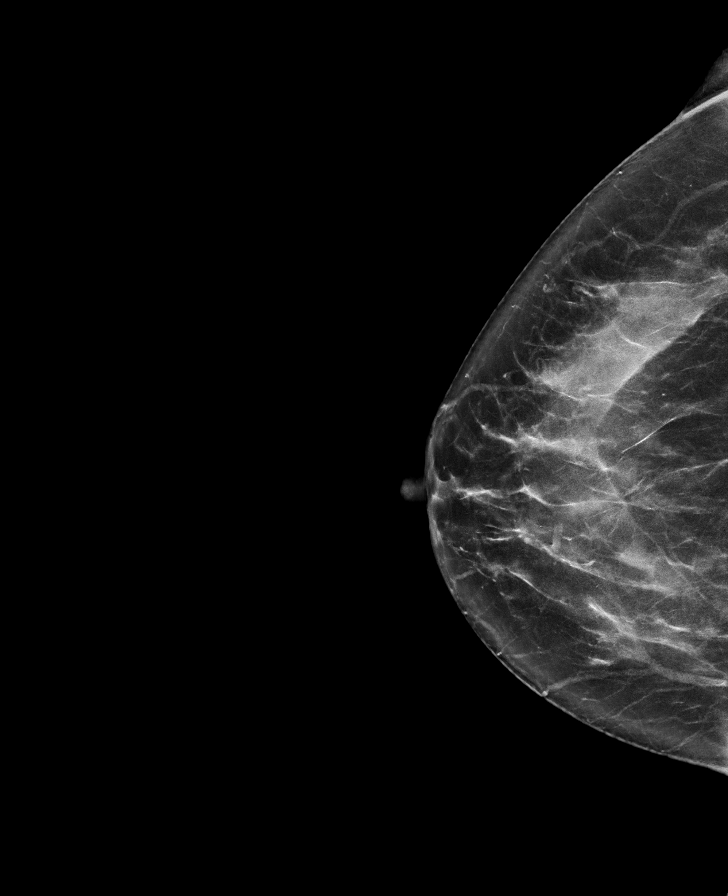

[L MLO tomo · tomo slice 40/79.0]
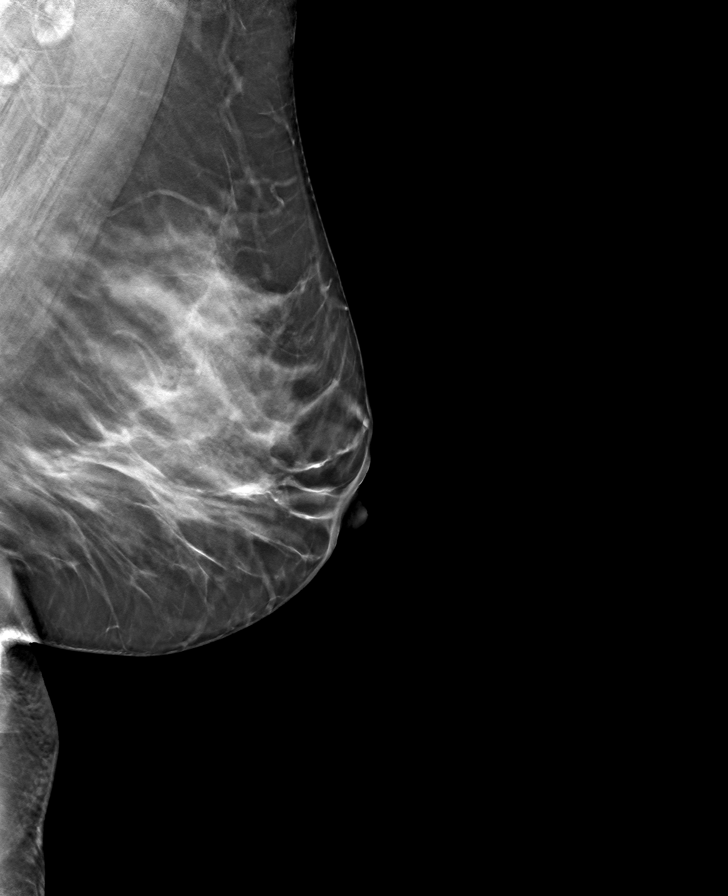

[R MLO tomo · tomo slice 41/81.0]
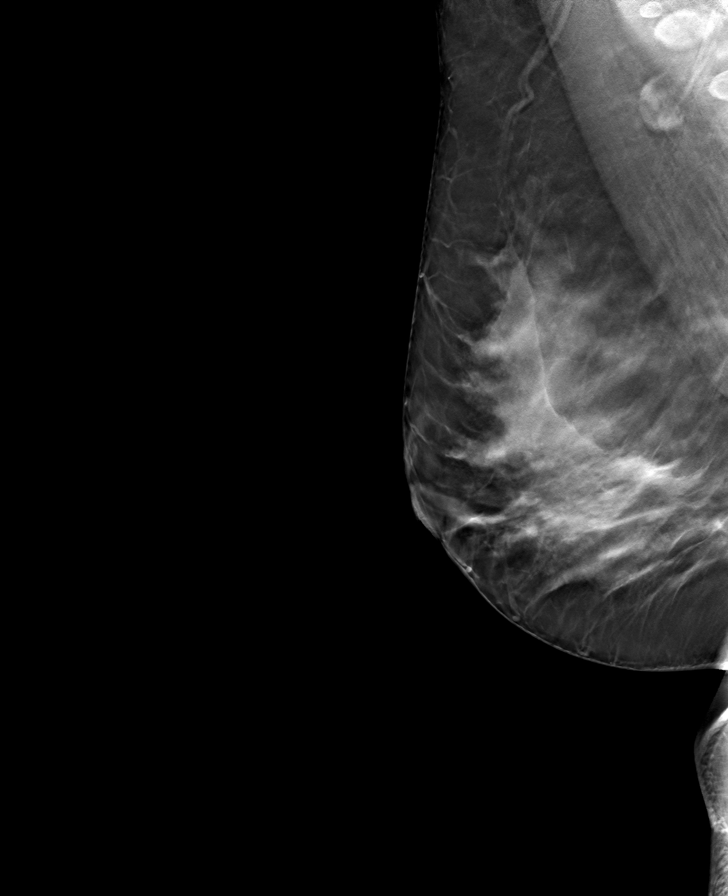

[L CC tomo · tomo slice 39/77.0]
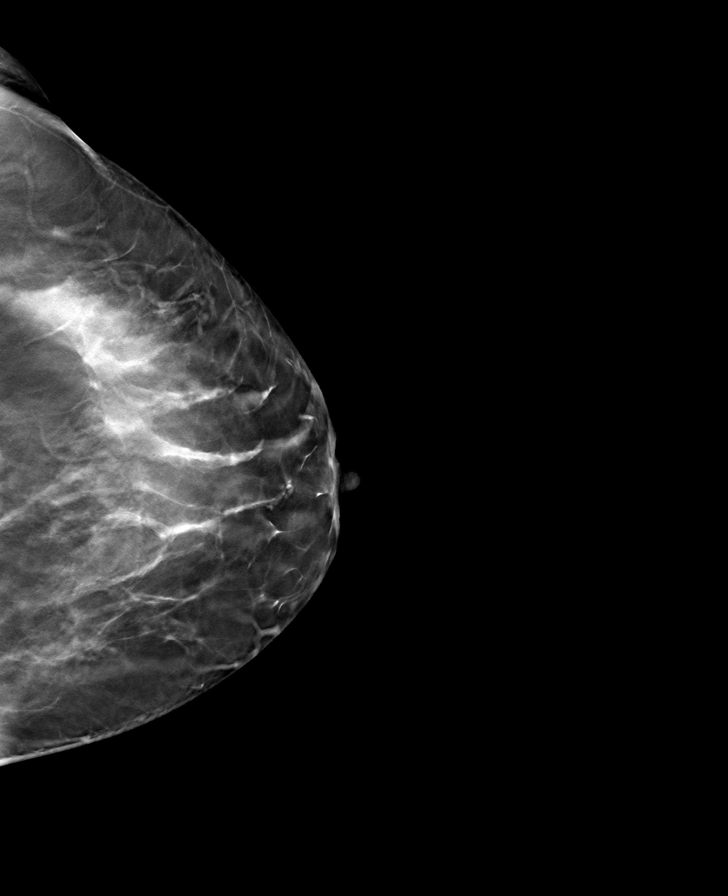

[R CC tomo · tomo slice 37/74.0]
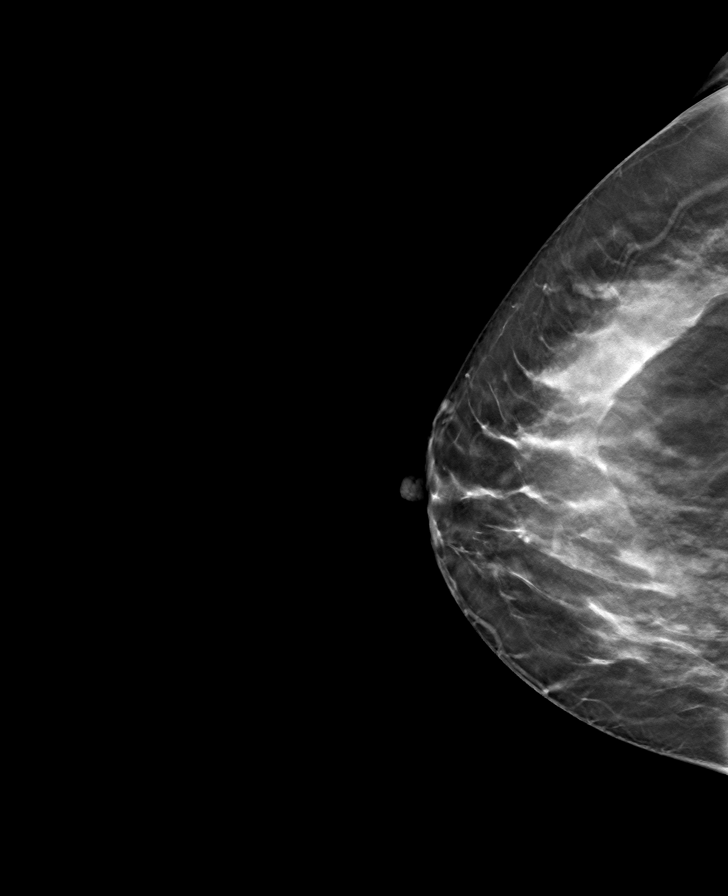

[8 of 24 positions shown; findings below may reference images not displayed]

ACR Breast Density Category d: The breast tissue is extremely dense,
which lowers the sensitivity of mammography
FINDINGS: There are no findings suspicious for malignancy. Images were
processed with CAD.
IMPRESSION: No mammographic evidence of malignancy. A result letter of this
screening mammogram will be mailed directly to the patient.

RECOMMENDATION:
Screening mammogram in one year. (Code:[id number])

BI-RADS CATEGORY  1: Negative.

## 2021-12-28 NOTE — Telephone Encounter (Signed)
I spoke with the patient.  She states she tries not to take the steroids more than once a month and if she feels better before the Dosepak and she will stop.  I did educate her that per Dr. Lucia Gaskins, every 2 to 3 months is acceptable but filling them every month can lead to long-term side effects.  The patient stated she has had a migraine every day for the last 2 weeks. She has taken 3 injections. She is concerned that her migraine has not gotten better and states she does not trust herself driving.  She has had blurred vision and the numbness that she has with migraines. She has 2 days left of the steroid dose pack that she filled at the end of January.   I told her it was okay to go ahead and finish the Dosepak since she is almost done, but do not take any more until we have an appointment with Dr. Lucia Gaskins to discuss medication management.  The patient was scheduled for a Mychart video visit with Dr Lucia Gaskins this coming Monday, February 13 at 3:00 PM.  The patient verbalized appreciation for the call.

## 2022-01-01 ENCOUNTER — Encounter: Payer: Self-pay | Admitting: *Deleted

## 2022-01-01 ENCOUNTER — Telehealth: Payer: Medicare Other | Admitting: Neurology

## 2022-01-02 ENCOUNTER — Telehealth (INDEPENDENT_AMBULATORY_CARE_PROVIDER_SITE_OTHER): Payer: Medicare Other | Admitting: Neurology

## 2022-01-02 ENCOUNTER — Encounter: Payer: Self-pay | Admitting: Neurology

## 2022-01-02 ENCOUNTER — Telehealth: Payer: Self-pay | Admitting: Neurology

## 2022-01-02 DIAGNOSIS — G43711 Chronic migraine without aura, intractable, with status migrainosus: Secondary | ICD-10-CM

## 2022-01-02 DIAGNOSIS — G43411 Hemiplegic migraine, intractable, with status migrainosus: Secondary | ICD-10-CM

## 2022-01-02 MED ORDER — TOPIRAMATE ER 50 MG PO CAP24: 50.0000 mg | ORAL_CAPSULE | Freq: Every evening | ORAL | 3 refills | Status: DC

## 2022-01-02 MED ORDER — BACLOFEN 10 MG PO TABS: 10.0000 mg | ORAL_TABLET | Freq: Three times a day (TID) | ORAL | 11 refills | Status: DC | PRN

## 2022-01-02 MED ORDER — TOPIRAMATE ER 100 MG PO CAP24: 100.0000 mg | ORAL_CAPSULE | Freq: Every day | ORAL | 3 refills | Status: DC

## 2022-01-02 MED ORDER — GABAPENTIN 300 MG PO CAPS
300.0000 mg | ORAL_CAPSULE | Freq: Every day | ORAL | 3 refills | Status: DC
Start: 2022-01-02 — End: 2022-01-23

## 2022-01-02 NOTE — Telephone Encounter (Signed)
..   Pt understands that although there may be some limitations with this type of visit, we will take all precautions to reduce any security or privacy concerns.  Pt understands that this will be treated like an in office visit and we will file with pt's insurance, and there may be a patient responsible charge related to this service. ? ?

## 2022-01-02 NOTE — Progress Notes (Signed)
PATIENT: Holly [name] DOB: 03-01-1976  REASON FOR VISIT: follow up HISTORY FROM: patient  HISTORY OF PRESENT ILLNESS:  Virtual Visit via Video Note  I connected with [name] on 01/02/22 at  3:30 PM EST by a video enabled telemedicine application and verified that I am speaking with the correct person using two identifiers.  Location: Patient: home Provider: office   I discussed the limitations of evaluation and management by telemedicine and the availability of in person appointments. The patient expressed understanding and agreed to proceed.  Follow Up Instructions:    I discussed the assessment and treatment plan with the patient. The patient was provided an opportunity to ask questions and all were answered. The patient agreed with the plan and demonstrated an understanding of the instructions.   The patient was advised to call back or seek an in-person evaluation if the symptoms worsen or if the condition fails to improve as anticipated.  I provided over 70 minutes of non-face-to-face time during this encounter.   Melvenia Beam, MD   Interval history January 02, 2022: This is a patient we know very well since 2017 treated for chronic intractable hemiplegic migraines for years. She has botox injections every 12 weeks with me.  She has tried a plethora of medications (see below) she continues to have migraines monthly. Botox helps and gives her >50% improvement but she can still have severe migraine days and status migrainosus. Recently she asked for a Medrol Dosepak, she has been filling a Medrol Dosepak every month for at least a year for breakthrough severe migraines, and this appointment is to discuss better migraine management to decrease use of steroids. Her migraines have been worsening, she has a lot of stress, but she has been taking steroids monthly since July 2021 acutely and we discussed the long term sequela of prednisone use(she is quite  knowledgeable herself). Migraines have picked on intensity and frequency. She had a migraine for 2 weeks, nothing was relieving it, we discussed all her medications below and options she has not tried. We had a long discussion on medication she has not tried, medication she has tried, and we eventually decided to discontinue her methocarbamol, try baclofen again for her muscle spasms during the day and for acute management of migraines as needed, try gabapentin at bedtime which may help with her migraines and fibromyalgia and also help her sleep, and increase her Trokendi to 150 mg for migraine preventative(she was at 200mg  with side effects and we had decreased to 100mg , we will increase to 150 and see how she does).  Also, her blood pressure is not as low as it used to be and she is often tachycardic in the 80s and 90s, this means propranolol could be something we could use as well, I will see her back in a month and see how this is going.  From a current review of records, medications tried in the past that can be used in migraine management include Tylenol, baclofen(did well but impaired her milk when breastfeeding), currently on Botox every 12 weeks, currently on limited Fioricet (limited supply every month, discussed rebound extensively with patient), Flexeril, Medrol Dosepak, Decadron injections, Benadryl, Aimovig, Prozac, Emgality, on a very limited supply of hydrocodone (again she is given a very limited supply of hydrocodone to use only as absolutely needed), ibuprofen, seroquel, ketorolac oral and injections, we have prescribed Toradol injections for self administration at home, lasmiditan, Robaxin, Reglan, Zofran, prednisone tablets, Compazine tablets, Phenergan  suppository, Seroquel, Nurtec, Zoloft, Topamax, Qudexy,Ubrelvy caused stomach discomfort. Lasmiditan did not work. allergic to celebrex, melatonin, she declines triptans(She had a great response to imitrex in the past but then had side effects  which were likely due to the vasococonstrictive effects and possible TIA so contraindicated - also tried maxalt, treximet.), Amitrip, nortrip,  venlafaxine.  Blood pressure medications in the past have been contraindicated due to her very low blood pressure and pulse. She uses caffeine, Tylenol, Advil and Fioricet in that order to treat her acute headaches. In the past she has tried [place] with minimal benefit. Baclofen in the past helped but decreased her milk supply may consider trying again since she is no longer breast feeding. Has not tried Lyrica or Gabapentin. She does not want to try cymbalta because her son tried it and became suicidal. Her blood pressure is a lot better and now she is not bradycardic so next could also consider trying propranolol acutely or preventatively    Interval history 02/13/2019: This is a patient with chronic intractable hemiplegic migraines for many years. She has tried a plethora of medications including the new CGRP medications and now botox for migraines. She continues to suffer immensely with her migraines. The Roselyn Meier is wonderful but it knocks her out. She injected her emgality yesterday, her chest tightened up and she became dizzy. This has never happened. But she was having a migraine at the time of taking the medication. Unclear why this happened, but she says it was not serious or urgent. She had botox on 01/28/2019.  She refilled fioricet 3x in a 10-day period, she lost one prescription and one fell in the toilet and she was very upset, explained we absolutely trust her and we apologize. The last botox she had the worst migraine, she would like to have me do her injections. She feels the migraines becoming more manageable 50% better as manageable. She feels her menses are worse on the Trokendi, she has a lot of pain. And a lot of mood swings and being irate. Decrease Trokendi to 100mg   She had a great response to imitrex in the past but then had side effects which  were likely due to the vasococonstrictive effects so Lasmitidan may be a great medication.    Tpday 09/22/2018: Difficulty with insomnia, melatonin is not working, she has been on everything and has neck pain, memory foam with ergonomic pillow. Her neck pain is  like someone hitting her in the back of the neck. She feels her mind is racing and keeps her awake but often it is the pain in the neck that stops her from sleeping. Discussed options, will Add botox on to the regimen. She has a lot pain and shooting/burning at the back of the neck possibly occipital neuralgia. She continues to have daily headaches. 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 No aura. No medication overuse. Can last 24-48 hours and are moderately severe to severe ongoing for years.   Today 01/02/22  Ms. Delbuono is a 46 year old female with a history of migraine headaches.  She returns today for follow-up.  She remains on Trokendi and Fioricet.  She states that she has had 3 injections of Emgality.  She reports that her headache severity has definitely improved.  She continues to have a daily headache however since starting the injections her headaches are not lasting all day long.  She also reports that she is not having to go to urgent care as frequently  as before.  She states that she has one severe migraine with hemiplegic symptoms at least once a week.  She states that she did have the LEAP procedure due to an abnormal Pap smear and she has significant bleeding afterwards and was placed on a progesterone tablet.  She reports for this reason her headaches were slightly worse during this time.  She has stopped the progesterone.  She also reports that with the injection she has noticed constipation within the first 10 days after the injection.  She states that she has tried Colace with no benefit.  She did try MiraLAX and that seemed to work better.  She returns today for an evaluation.  HISTORY  09/16/2017: She  has been diagnosed with fibromyalgia. She is allergic to Celebrex. She is seeing her pcp and they may start Lyrica, which is also a migraine medication. She saw another doctor, who is a good neurologist and I recommend following with them. She uses Fioricet which I don't prefer but it is the only think that helps her, she has been advised about rebound headache. She has also seen Dr. Sima Matas at the [facility].     Interval History 08/07/2016:  WILENA FLOCCO is a 46 y.o. female here as a referral from Dr. Ernie Hew for hemiplegic migraines. She has severe migraines what impair her both physicilly and cognitively, she can't even remember her own name, she can;t walk straight, her balance is impaired, she has slurred speech, she stutters, can;t take care of her 6 children or at times herself when recurrence of migraines. 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 The last several have been extreme in her severity. She has gone to the emergency room multiple times for severe refractory migraines. Patient's migraines have improved with Topiramate but they are still occurring 3-4 days a week and can be severe when she has a migraine 10 days in a row. She takes Topiramate 200mg  daily. She is breastfeeding. When she has migraines she is debilitated often and has to stay in bed in a dark room all day and her husband has had to stay home. She has 15-17 migraine days in a month. She is weaning her last child this last month and her hormones are fluctuating. Will have the Botox representative call.    Meds tried: Topiramate,Amitrip, nortrip. Currently on topamax 200mg  daily. Also tried venlafaxine, imitrex, maxalt, treximet. Blood pressure medications are contraindicated due to her very low blood pressure.    Ward Givens 05/29/2016: Today 04/29/2016: Ms. Pralle is a 46 year old female with a  history of migraine headaches. She returns today for follow-up. She states that her headaches have improved with Topamax. She states she typically has 1-2 headaches a week. Before she was having 3-5 headaches a week. She is currently taken Topamax 150 mg at bedtime. She reports that with her migraines she is experiencing weakness on the right side typically in the arm but it can move to the leg. She also noticed slurring speech and stuttering. In the past the symptoms have been worrisome and she went to the emergency room thinking she was having a stroke. She was told that it was migraine headache. She reports that the symptoms never occur without a migraine. She uses caffeine, Tylenol, Advil and Fioricet in that order to treat her acute headaches. In the past she has tried triptans and [place]ia with minimal benefit.  She states baclofen was helpful for her headaches however it stopped her milk production. She plans to continue breast-feeding to the child is 25 months old. She returns today for an evaluation.       HPI 04/06/2016:  DAVI SCHUTTER is a 46 y.o. female here as a referral from Dr. Ernie Hew. She has had migraines for 21 years. She has tried a lot of things. Migraines are affecting her speech and she is slurring stuttering with the migraines. She trained in nursing and psychology. She has daily headaches, migraines. They are on the left side 98% of the time, behind the eye, throbbing and pounding like a "pick axe", laying down in a dark room helps, endorses extreme sounds sensitivity, light sensitivity, smell triggers, no aura. 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 No medication overuse. She had extensive testing at [facility] with MRIs and LPs. She has nausea and vomiting with the migraines. No inciting event, no head trauma, has a FHx of migraines.  Lack of sleep worsens, drinks caffeine once a day. She drink enough fluid. She was bedridden for a whole year with migraines. She has  daily migraines that can last all day.No other focal neurologic deficits. She does have musculoskeletal neck pain.   She has tried everything: Only Fioricet and Vicodin helps.That is the only thing she takes. She tried Amitrip, nortrip. Currently on topamax 75mg  daily. Also tried venlafaxine, imitrex, maxalt, treximet.    Never tried propranolol   Reviewed notes, labs and imaging from outside physicians, which showed:   CT if the head 2004: CLINICAL DATA:    HEADACHE.  RIGHT AND LEFT-SIDED NUMBNESS OF ARMS AND LEGS.  BLURRED VISION FOR TWO WEEKS.  TWO MONTHS POST PARTUM.  PREVIOUS MRI OF THE BRAIN IN 1998.CT SCAN OF THE HEAD WITHOUT CONTRAST WITH BONE WINDOWS A SERIES OF SCANS OF THE ENTIRE HEAD ARE MADE WITHOUT CONTRAST AND SHOW NO EVIDENCE OF INTRACRANIAL MASS OR HEMORRHAGE.  THERE IS NO SHIFT OF MIDLINE STRUCTURES.  THE VENTRICULAR SYSTEM APPEARS NORMAL.  BONE WINDOWS SHOW THE PARANASAL SINUSES, BASE OF THE SKULL, BONY CALVARIUM, AND INTERNAL AUDITORY CANALS TO BE NORMAL. IMPRESSION NORMAL CT SCAN OF THE HEAD WITHOUT CONTRAST.   RPR, HIV negative.    Reviewed notes from OBGyn: Pt has had migraines for 20 years. Patient reports continuous migraine for 3 months. She is on Topamax 75mg  qhs. She has tunnel vision with migraines. Cries unconsolably with blood in her tears. She has diarrhea with the Topamax but wants to continue it. She is nursing her baby and is almost done, decreased sleep and fatigue is worsening migraines. 5 trips to the ER in the last 6 weeks. Imitrex gave her a panic attack and numbness in the face. Exam was unremarkable. On Zofran and Topiramate.     REVIEW OF SYSTEMS: Patient complains of symptoms per HPI as well as the following symptoms: migraines, stress . Pertinent negatives and positives per HPI. All others negative   ALLERGIES: Allergies  Allergen Reactions   Other Rash    Overly sleepy zyrtec   Terbutaline Palpitations   Emgality [Galcanezumab-Gnlm] Other (See  Comments)    Per patient, rx now gives her GI problems and causes IBS symptoms to worsen.    Sumatriptan    Adhesive [Tape] Itching and Rash   Aimovig [Erenumab-Aooe] Rash   Celebrex [Celecoxib] Rash    03/17/18 patient is unsure, thinks it may have been shaving cream   Cetirizine Hcl Other (See  Comments)    Overly sleepy    Ciprofloxacin Rash    Swelling of knees    HOME MEDICATIONS: Outpatient Medications Prior to Visit  Medication Sig Dispense Refill   ALLERGY RELIEF/NASAL DECONGEST 10-240 MG 24 hr tablet TAKE ONE TABLET BY MOUTH DAILY 30 tablet 11   butalbital-acetaminophen-caffeine (FIORICET) 50-325-40 MG tablet Take 1-2 tablets by mouth every 6 (six) hours as needed for headache. 15 tablet 5   FLUoxetine (PROZAC) 10 MG capsule Take 1 capsule (10 mg total) by mouth daily. Take with 20 mg capsules for total daily dose 30 mg 90 capsule 3   FLUoxetine (PROZAC) 20 MG capsule Take 1 capsule (20 mg total) by mouth daily. 90 capsule 3   HYDROcodone-acetaminophen (NORCO/VICODIN) 5-325 MG tablet Take 1 tablet by mouth every 6 (six) hours as needed for moderate pain. 60 tablet 0   ibuprofen (ADVIL) 800 MG tablet Take 1 tablet (800 mg total) by mouth 3 (three) times daily with meals as needed for headache, moderate pain or cramping. 90 tablet 1   ketorolac (TORADOL) 60 MG/2ML SOLN injection INJECT 2 ML INTRAMUSCULARLY EVERY 6 HOURS AS NEEDED FOR  MIGRANE. NO MORE THAN 120 MG (4ML) IN  24 HOURS AND NO MORE THAN 4 DAYS PER MONTH. 28 mL 0   NEEDLE, DISP, 23 G 23G X 1" MISC Attach to syringe to draw up and administer Toradol into the muscle. 4 each 6   norethindrone (MICRONOR) 0.35 MG tablet Take 1 tablet (0.35 mg total) by mouth daily. 84 tablet 3   omeprazole (PRILOSEC) 40 MG capsule Take 1 capsule (40 mg total) by mouth daily. 90 capsule 1   ondansetron (ZOFRAN-ODT) 8 MG disintegrating tablet Take 1 tablet (8 mg total) by mouth every 8 (eight) hours as needed for nausea. 30 tablet 11    prochlorperazine (COMPAZINE) 10 MG tablet Take 1 tablet (10 mg total) by mouth every 6 (six) hours as needed. 30 tablet 11   promethazine (PHENERGAN) 25 MG suppository Place 1 suppository (25 mg total) rectally every 6 (six) hours as needed for nausea or vomiting. 12 each 11   Syringe, Disposable, (B-D SYRINGE LUER-LOK 3CC) 3 ML MISC Attach to needle to draw up and administer Toradol into the muscle. 4 each 6   SYRINGE-NEEDLE, DISP, 3 ML (B-D 3CC LUER-LOK SYR 23GX1") 23G X 1" 3 ML MISC Attach to syringe to draw up and administer Toradol into the muscle. 4 each 6   triamcinolone cream (KENALOG) 0.1 % Apply 1 application topically 2 (two) times daily. To affected area(s) as needed 45 g 1   methocarbamol (ROBAXIN) 500 MG tablet Take 1 tablet (500 mg total) by mouth every 6 (six) hours as needed for muscle spasms. 120 tablet 6   methylPREDNISolone (MEDROL DOSEPAK) 4 MG TBPK tablet Take tablets by mouth daily with food in the morning for 6 days 21 tablet 1   Topiramate ER (TROKENDI XR) 100 MG CP24 Take 1 capsule by mouth at bedtime. 90 capsule 3   No facility-administered medications prior to visit.    PAST MEDICAL HISTORY: Past Medical History:  Diagnosis Date   Depression    Eczema    scalp   Family history of adverse reaction to anesthesia    mother struggles with sob, slow to awaken, n/v bedridden for days   Fibromyalgia 2018   GERD (gastroesophageal reflux disease)    Hemiplegic migraine    IBS (irritable bowel syndrome)    Migraine    MRSA (methicillin resistant  staph aureus) culture positive not sure when   Vaginal Pap smear, abnormal     PAST SURGICAL HISTORY: Past Surgical History:  Procedure Laterality Date   CERVICAL CONIZATION W/BX N/A 07/05/2020   Procedure: COLD KNIFE CONIZATION CERVIX WITH BIOPSY; COLPOSCOPY AND ENDOCERVICAL CURRETAGE;  Surgeon: Sloan Leiter, MD;  Location: [facility];  Service: Gynecology;  Laterality: N/A;   COLPOSCOPY     x 3 with  biopsy on 1 done   LEEP  2020   MULTIPLE TOOTH EXTRACTIONS     as child   WRIST SURGERY Left 2010   cyst removal    FAMILY HISTORY: Family History  Problem Relation Age of Onset   Thyroid disease Mother    Scoliosis Mother    Depression Mother    Migraines Mother    Hyperlipidemia Mother    Hypertension Mother    Heart attack Mother    Crohn's disease Father    Autoimmune disease Father    Factor V Leiden deficiency Father    Heart failure Father    Diabetes Father    Hyperlipidemia Father    Heart attack Father    Cancer Sister    Depression Sister    Diabetes Sister    Diabetes Paternal Grandfather     SOCIAL HISTORY: Social History   Socioeconomic History   Marital status: Married    Spouse name: Audelia Acton   Number of children: 6   Years of education: 15   Highest education level: Some college, no degree  Occupational History   Occupation: Materials engineer  Tobacco Use   Smoking status: Never   Smokeless tobacco: Never  Vaping Use   Vaping Use: Never used  Substance and Sexual Activity   Alcohol use: No    Alcohol/week: 0.0 standard drinks    Comment: none   Drug use: Never   Sexual activity: Yes    Birth control/protection: Pill  Other Topics Concern   Not on file  Social History Narrative   1 caffeine drink a day at max   Right handed   Lives at home with children. Husband is a Administrator and rarely home.   Has a lot of migraines   Social Determinants of Health   Financial Resource Strain: Not on file  Food Insecurity: Not on file  Transportation Needs: Not on file  Physical Activity: Not on file  Stress: Not on file  Social Connections: Not on file  Intimate Partner Violence: Not on file     Physical exam: Exam: Gen: NAD, conversant      CV: Could not perform over Web Video. Denies palpitations or chest pain or SOB. VS: Breathing at a normal rate. Weight appears within normal limits. Not febrile. Eyes: Conjunctivae clear without exudates or  hemorrhage  Neuro: Detailed Neurologic Exam  Speech:    Speech is normal; fluent and spontaneous with normal comprehension.  Cognition:    The patient is oriented to person, place, and time;     recent and remote memory intact;     language fluent;     normal attention, concentration,     fund of knowledge Cranial Nerves:    The pupils are equal, round, and reactive to light. Cannot perform fundoscopic exam. Visual fields are full to finger confrontation. Extraocular movements are intact.  The face is symmetric with normal sensation. The palate elevates in the midline. Hearing intact. Voice is normal. Shoulder shrug is normal. The tongue has normal motion without fasciculations.  Coordination:    Normal finger to nose  Gait:    Normal native gait  Motor Observation:   no involuntary movements noted. Tone:    Appears normal  Posture:    Posture is normal. normal erect    Strength:    Strength is anti-gravity and symmetric in the upper and lower limbs.      Sensation: intact to LT      DIAGNOSTIC DATA (LABS, IMAGING, TESTING) - I reviewed patient records, labs, notes, testing and imaging myself where available.  Lab Results  Component Value Date   WBC 11.8 (H) 12/04/2021   HGB 12.6 12/04/2021   HCT 36.9 12/04/2021   MCV 89.8 12/04/2021   PLT 296 12/04/2021      Component Value Date/Time   NA 141 12/04/2021 0000   NA 143 12/20/2020 1633   K 4.4 12/04/2021 0000   CL 109 12/04/2021 0000   CO2 26 12/04/2021 0000   GLUCOSE 77 12/04/2021 0000   BUN 8 12/04/2021 0000   BUN 9 12/20/2020 1633   CREATININE 1.02 (H) 12/04/2021 0000   CALCIUM 9.5 12/04/2021 0000   PROT 7.3 12/04/2021 0000   PROT 7.4 12/20/2020 1633   ALBUMIN 4.5 12/20/2020 1633   AST 210 (H) 12/04/2021 0000   ALT 94 (H) 12/04/2021 0000   ALKPHOS 88 12/20/2020 1633   BILITOT 0.6 12/04/2021 0000   BILITOT 0.2 12/20/2020 1633   GFRNONAA 72 03/16/2021 0000   GFRAA 83 03/16/2021 0000   Lab Results   Component Value Date   CHOL 160 03/16/2021   HDL 35 (L) 03/16/2021   LDLCALC 103 (H) 03/16/2021   TRIG 127 03/16/2021   CHOLHDL 4.6 03/16/2021    Lab Results  Component Value Date   TSH 1.08 03/16/2021      ASSESSMENT AND PLAN 46 y.o. year old female  has a past medical history of Depression, Eczema, Family history of adverse reaction to anesthesia, Fibromyalgia (2018), GERD (gastroesophageal reflux disease), Hemiplegic migraine, IBS (irritable bowel syndrome), Migraine, MRSA (methicillin resistant staph aureus) culture positive (not sure when), and Vaginal Pap smear, abnormal. here with increased migraine frequency and severity.  - we discussed all her extensive medications tried (see list below). We had a long discussion on medication she has not tried, medication she has tried, and we eventually decided to: 1. discontinue her methocarbamol, try baclofen again for her muscle spasms during the day and for acute management of migraines as needed,  2. try gabapentin at bedtime which may help with her migraines and fibromyalgia and also help her sleep,  3. increase her Trokendi to 150 mg for migraine preventative(she was at 200mg  with side effects and we had decreased to 100mg , we will increase to 150 and see how she does).   4. Also, her blood pressure is not as low as it used to be and she is often tachycardic in the 80s and 90s, this means propranolol could be something we could try as well, I will see her back in a month and see how above is going.  - Patient gets a very limited supply of vicodin and fioricet, we have discussed that these are not recommend in migraine management however given her history and all the medication she has tried, we give her a limited amount, she is very knowledgeable and compliant with administration. , this is an extenuating case for migraines and explained I wouldn't do that for many migraine patients but with hemiplegia if this helps and nothing  else helps  we can provide.  - Also Fioricet, will give her 15 a month but explained do not take more than 5x - 10x a month (she takes 2 pills at onset). This is also NOT something I like to do for the average migraine patient but given her severity of hemiplegia and her trustworthiness will continue. There is evidence this may be making her migraines worse, discussed.  From a current review of records, medications tried in the past that can be used in migraine management include Tylenol, baclofen(did well but impaired her milk when breastfeeding), currently on Botox every 12 weeks, currently on limited Fioricet (limited supply every month, discussed rebound extensively with patient), Flexeril, Medrol Dosepak, Decadron injections, Benadryl, Aimovig, Prozac, Emgality, on a very limited supply of hydrocodone (again she is given a very limited supply of hydrocodone to use only as absolutely needed), ibuprofen, seroquel, ketorolac oral and injections, we have prescribed Toradol injections for self administration at home, lasmiditan, Robaxin, Reglan, Zofran, prednisone tablets, Compazine tablets, Phenergan suppository, Seroquel, Nurtec, Zoloft, Topamax, Qudexy,Ubrelvy caused stomach discomfort. Lasmiditan did not work. allergic to celebrex, melatonin, she declines triptans(She had a great response to imitrex in the past but then had side effects which were likely due to the vasococonstrictive effects and possible TIA so contraindicated - also tried maxalt, treximet.), Amitrip, nortrip,  venlafaxine.  Blood pressure medications in the past have been contraindicated due to her very low blood pressure and pulse. She uses caffeine, Tylenol, Advil and Fioricet in that order to treat her acute headaches. In the past she has tried [place] with minimal benefit. Baclofen in the past helped but decreased her milk supply may consider trying again since she is no longer breast feeding. Has not tried Lyrica or Gabapentin. She does not want to  try cymbalta because her son tried it and became suicidal. Her blood pressure is a lot better and now she is not bradycardic so next could also consider trying propranolol acutely or preventatively  Meds ordered this encounter  Medications   baclofen (LIORESAL) 10 MG tablet    Sig: Take 1 tablet (10 mg total) by mouth 3 (three) times daily as needed for muscle spasms. Or for migraines.    Dispense:  90 each    Refill:  11    Cancel methocarbamol   Topiramate ER (TROKENDI XR) 100 MG CP24    Sig: Take 100 mg by mouth at bedtime. Take with a 50mg  capsule for a total of 150mg  at bedtme    Dispense:  90 capsule    Refill:  3   Topiramate ER (TROKENDI XR) 50 MG CP24    Sig: Take 50 mg by mouth at bedtime. Take with a 100mg  capsule for a total of 150mg  at bedtme.    Dispense:  90 capsule    Refill:  3   gabapentin (NEURONTIN) 300 MG capsule    Sig: Take 1-2 capsules (300-600 mg total) by mouth at bedtime.    Dispense:  180 capsule    Refill:  3    Discussed in the past: There is increased risk for stroke in women with migraine with aura and a contraindication for the combined contraceptive pill for use by women who have migraine with aura. The risk for women with migraine without aura is lower. However other risk factors like smoking are far more likely to increase stroke risk than migraine. There is a recommendation for no smoking and for the use of OCPs without estrogen such as  progestogen only pills particularly for women with migraine with aura.Marland Kitchen People who have migraine headaches with auras may be 3 times more likely to have a stroke caused by a blood clot, compared to migraine patients who don't see auras. Women who take hormone-replacement therapy may be 30 percent more likely to suffer a clot-based stroke than women not taking medication containing estrogen. Other risk factors like smoking and high blood pressure may be  much more important.    Sarina Ill, MD [facility] [address], [place], [place] [address] [phone]

## 2022-01-23 ENCOUNTER — Ambulatory Visit (INDEPENDENT_AMBULATORY_CARE_PROVIDER_SITE_OTHER): Payer: Medicare Other | Admitting: Neurology

## 2022-01-23 ENCOUNTER — Ambulatory Visit: Payer: Medicare Other | Admitting: Neurology

## 2022-01-23 DIAGNOSIS — G43711 Chronic migraine without aura, intractable, with status migrainosus: Secondary | ICD-10-CM | POA: Diagnosis not present

## 2022-01-23 MED ORDER — PROCHLORPERAZINE MALEATE 10 MG PO TABS: 10.0000 mg | ORAL_TABLET | Freq: Four times a day (QID) | ORAL | 11 refills | Status: DC | PRN

## 2022-01-23 MED ORDER — HYDROCODONE-ACETAMINOPHEN 5-325 MG PO TABS: 1.0000 | ORAL_TABLET | Freq: Four times a day (QID) | ORAL | 0 refills | Status: DC | PRN

## 2022-01-23 MED ORDER — PROPRANOLOL HCL 20 MG PO TABS: ORAL_TABLET | ORAL | 6 refills | Status: DC

## 2022-01-23 MED ORDER — ONDANSETRON 8 MG PO TBDP: 8.0000 mg | ORAL_TABLET | Freq: Three times a day (TID) | ORAL | 11 refills | Status: DC | PRN

## 2022-01-23 MED ORDER — BUTALBITAL-APAP-CAFFEINE 50-325-40 MG PO TABS: 1.0000 | ORAL_TABLET | Freq: Four times a day (QID) | ORAL | 5 refills | Status: DC | PRN

## 2022-01-23 MED ORDER — KETOROLAC TROMETHAMINE 60 MG/2ML IM SOLN: INTRAMUSCULAR | 6 refills | Status: DC

## 2022-01-23 MED ORDER — PROMETHAZINE HCL 25 MG RE SUPP: 25.0000 mg | Freq: Four times a day (QID) | RECTAL | 11 refills | Status: DC | PRN

## 2022-01-23 MED ORDER — IBUPROFEN 800 MG PO TABS: 800.0000 mg | ORAL_TABLET | Freq: Three times a day (TID) | ORAL | 6 refills | Status: DC | PRN

## 2022-01-23 MED ORDER — "BD LUER-LOK SYRINGE 23G X 1"" 3 ML MISC": 6 refills | Status: DC

## 2022-01-23 MED ORDER — BD SYRINGE LUER-LOK 3 ML MISC: 6 refills | Status: DC

## 2022-01-23 NOTE — Progress Notes (Signed)
Botox- 200 units x 1 vial ?Lot: [id number] ?Expiration: 07/2024 ?NDC: 0023-3921-02 ? ?Bacteriostatic 0.9% Sodium Chloride- 4mL total ?Lot: [id number] ?Expiration: 06/20/2023 ?NDC: 0409-1966-02 ? ?Dx: G43.711 ?B/B ? ?

## 2022-01-23 NOTE — Progress Notes (Signed)
Consent Form Botulism Toxin Injection For Chronic Migraine    01/23/2022: Stable, the gabapentin made her dizzy but helped sleep. We will try propranolol at bedtime but watch very carefully for bradycardia and hypotension. If tolerated cn change to 60mg  ER at bedtime.   Meds ordered this encounter  Medications   propranolol (INDERAL) 20 MG tablet    Sig: Start with one pill(20mg ) at bedtime. In one week increase to 2 pills(40mg ) at bedtime. In 3 weeks increase to 3 pills(60mg ) at bedtime. If tolerating can change to ER formula at bedtime 60mg .    Dispense:  90 tablet    Refill:  6   ibuprofen (ADVIL) 800 MG tablet    Sig: Take 1 tablet (800 mg total) by mouth 3 (three) times daily with meals as needed for headache, moderate pain or cramping.    Dispense:  90 tablet    Refill:  6   ondansetron (ZOFRAN-ODT) 8 MG disintegrating tablet    Sig: Take 1 tablet (8 mg total) by mouth every 8 (eight) hours as needed for nausea.    Dispense:  30 tablet    Refill:  11   promethazine (PHENERGAN) 25 MG suppository    Sig: Place 1 suppository (25 mg total) rectally every 6 (six) hours as needed for nausea or vomiting.    Dispense:  12 each    Refill:  11   butalbital-acetaminophen-caffeine (FIORICET) 50-325-40 MG tablet    Sig: Take 1-2 tablets by mouth every 6 (six) hours as needed for headache.    Dispense:  15 tablet    Refill:  5    Do not fill early, 15 tablets only per month. This prescription was filled on 02/20/2021. Any refills authorized will be placed on file.   prochlorperazine (COMPAZINE) 10 MG tablet    Sig: Take 1 tablet (10 mg total) by mouth every 6 (six) hours as needed.    Dispense:  30 tablet    Refill:  11   HYDROcodone-acetaminophen (NORCO/VICODIN) 5-325 MG tablet    Sig: Take 1 tablet by mouth every 6 (six) hours as needed for moderate pain.    Dispense:  60 tablet    Refill:  0   SYRINGE-NEEDLE, DISP, 3 ML (B-D 3CC LUER-LOK SYR 23GX1") 23G X 1" 3 ML MISC    Sig: Attach  to syringe to draw up and administer Toradol into the muscle.    Dispense:  4 each    Refill:  6   Syringe, Disposable, (B-D SYRINGE LUER-LOK 3CC) 3 ML MISC    Sig: Attach to needle to draw up and administer Toradol into the muscle.    Dispense:  4 each    Refill:  6    30 day supply   ketorolac (TORADOL) 60 MG/2ML SOLN injection    Sig: INJECT 2 ML INTRAMUSCULARLY EVERY 6 HOURS AS NEEDED FOR  MIGRANE. NO MORE THAN 120 MG ( ) IN  24 HOURS AND NO MORE THAN 4 DAYS PER MONTH.    Dispense:  28 mL    Refill:  6    10/24/2021: Doing extremely well on botox > 70% improvement in frequency and severity of migraines and headaches per patient today.Ubrelvy caused stomach discomfort. Lasmiditan did not work. Tried Nurtec. Continue Fioricet, only acute med that works. She is not a clencher and she has no pain behind her eyes. We try to position the occipital and cervical injections "around the pony tail area" and at occipitalis more using "follow the pain  protocol"     Reviewed orally with patient, additionally signature is on file:Consent Form Botulism Toxin Injection For Chronic Migraine    Reviewed orally with patient, additionally signature is on file:  Botulism toxin has been approved by the Federal drug administration for treatment of chronic migraine. Botulism toxin does not cure chronic migraine and it may not be effective in some patients.  The administration of botulism toxin is accomplished by injecting a small amount of toxin into the muscles of the neck and head. Dosage must be titrated for each individual. Any benefits resulting from botulism toxin tend to wear off after 3 months with a repeat injection required if benefit is to be maintained. Injections are usually done every 3-4 months with maximum effect peak achieved by about 2 or 3 weeks. Botulism toxin is expensive and you should be sure of what costs you will incur resulting from the injection.  The side effects of botulism  toxin use for chronic migraine may include:   -Transient, and usually mild, facial weakness with facial injections  -Transient, and usually mild, head or neck weakness with head/neck injections  -Reduction or loss of forehead facial animation due to forehead muscle weakness  -Eyelid drooping  -Dry eye  -Pain at the site of injection or bruising at the site of injection  -Double vision  -Potential unknown long term risks  Contraindications: You should not have Botox if you are pregnant, nursing, allergic to albumin, have an infection, skin condition, or muscle weakness at the site of the injection, or have myasthenia gravis, Lambert-Eaton syndrome, or ALS.  It is also possible that as with any injection, there may be an allergic reaction or no effect from the medication. Reduced effectiveness after repeated injections is sometimes seen and rarely infection at the injection site may occur. All care will be taken to prevent these side effects. If therapy is given over a long time, atrophy and wasting in the muscle injected may occur. Occasionally the patient's become refractory to treatment because they develop antibodies to the toxin. In this event, therapy needs to be modified.  I have read the above information and consent to the administration of botulism toxin.    BOTOX PROCEDURE NOTE FOR MIGRAINE HEADACHE    Contraindications and precautions discussed with patient(above). Aseptic procedure was observed and patient tolerated procedure. Procedure performed by Dr. Artemio Aly  The condition has existed for more than 6 months, and pt does not have a diagnosis of ALS, Myasthenia Gravis or Lambert-Eaton Syndrome.  Risks and benefits of injections discussed and pt agrees to proceed with the procedure.  Written consent obtained  These injections are medically necessary. Pt  receives good benefits from these injections. These injections do not cause sedations or hallucinations which the oral  therapies may cause.  Description of procedure:  The patient was placed in a sitting position. The standard protocol was used for Botox as follows, with 5 units of Botox injected at each site:   -Procerus muscle, midline injection  -Corrugator muscle, bilateral injection  -Frontalis muscle, bilateral injection, with 2 sites each side, medial injection was performed in the upper one third of the frontalis muscle, in the region vertical from the medial inferior edge of the superior orbital rim. The lateral injection was again in the upper one third of the forehead vertically above the lateral limbus of the cornea, 1.5 cm lateral to the medial injection site.  -Temporalis muscle injection, 4 sites, bilaterally. The first injection was 3 cm  above the tragus of the ear, second injection site was 1.5 cm to 3 cm up from the first injection site in line with the tragus of the ear. The third injection site was 1.5-3 cm forward between the first 2 injection sites. The fourth injection site was 1.5 cm posterior to the second injection site.   -Occipitalis muscle injection, 3 sites, bilaterally. The first injection was done one half way between the occipital protuberance and the tip of the mastoid process behind the ear. The second injection site was done lateral and superior to the first, 1 fingerbreadth from the first injection. The third injection site was 1 fingerbreadth superiorly and medially from the first injection site.  -Cervical paraspinal muscle injection, 2 sites, bilateral knee first injection site was 1 cm from the midline of the cervical spine, 3 cm inferior to the lower border of the occipital protuberance. The second injection site was 1.5 cm superiorly and laterally to the first injection site.  -Trapezius muscle injection was performed at 3 sites, bilaterally. The first injection site was in the upper trapezius muscle halfway between the inflection point of the neck, and the acromion. The  second injection site was one half way between the acromion and the first injection site. The third injection was done between the first injection site and the inflection point of the neck.   Will return for repeat injection in 3 months.   155  units of Botox was used, 45 U Botox not injected was wasted. The patient tolerated the procedure well, there were no complications of the above procedure.

## 2022-02-02 ENCOUNTER — Other Ambulatory Visit: Payer: Self-pay | Admitting: Family Medicine

## 2022-02-02 NOTE — Telephone Encounter (Signed)
No showed her transfer of care appointment with me. Sending a 90 day supply but no further refills until new PCP established.  ?

## 2022-02-02 NOTE — Telephone Encounter (Signed)
LVM for patient to call back to get this TOC visit scheduled. AM ?

## 2022-02-02 NOTE — Telephone Encounter (Signed)
Forwarding to PCP.

## 2022-02-27 ENCOUNTER — Encounter: Payer: Self-pay | Admitting: Neurology

## 2022-02-27 MED ORDER — PROPRANOLOL HCL ER 60 MG PO CP24: 60.0000 mg | ORAL_CAPSULE | Freq: Every day | ORAL | 10 refills | Status: DC

## 2022-03-06 ENCOUNTER — Encounter: Payer: Self-pay | Admitting: Neurology

## 2022-03-07 NOTE — Telephone Encounter (Signed)
Spoke to patient feels a lot better. Pt states that she has stopped propranolol . Per Dr.Ahern offered  patient an appointment with  a NP. Patient Declined appointment pt states since she feels much better today she will wait to come see Dr Jaynee Eagles at her next appointment 04/17/2022. Informed patient if  she starts to get chest pains again she can call 911 again so she can get evaluated again . Pt states she will stop propranolol so she can  discuss medication with Dr.Ahern in May . will send a FYI to Dr Jaynee Eagles to inform her patient stopped propranolol . ?

## 2022-04-17 ENCOUNTER — Ambulatory Visit (INDEPENDENT_AMBULATORY_CARE_PROVIDER_SITE_OTHER): Payer: Medicare Other | Admitting: Neurology

## 2022-04-17 DIAGNOSIS — G43711 Chronic migraine without aura, intractable, with status migrainosus: Secondary | ICD-10-CM

## 2022-04-17 NOTE — Progress Notes (Signed)
Botox- 200 units x 1 vial Lot: [id number] EXpiration: 10/2024 NDC: 1937-9024-09  Bacteriostatic 0.9% Sodium Chloride- 25mL total Lot: [id number]  Expiration: 06/20/2023 NDC: 7353-2992-42  Dx: A83.419 B/B

## 2022-04-17 NOTE — Progress Notes (Signed)
Consent Form Botulism Toxin Injection For Chronic Migraine    04/17/2022: >50% improvement in migraine freq and severity. She is not a clencher and she has no pain behind her eyes. We try to position the occipital and cervical injections "around the pony tail area" and at occipitalis more using "follow the pain protocol". Stopped the propranolol, she had an episode of hemiplegia with migraine had EMS come and resolved. Baclofen is working well. It has been a very long time since she had a severe migraine like that, she can get numbness and hemiplegia, this one happened the 18th of April. She took fioircet and toradol and all of her rescue meds.   when the new nasal spray comes out will try it, triptans contraindicated  From 01/02/2022 visit: From a current review of records, medications tried in the past that can be used in migraine management include Tylenol, baclofen(did well but impaired her milk when breastfeeding), currently on Botox every 12 weeks, currently on limited Fioricet (limited supply every month, discussed rebound extensively with patient), Flexeril, Medrol Dosepak, Decadron injections, Benadryl, Aimovig, Prozac, Emgality, on a very limited supply of hydrocodone (again she is given a very limited supply of hydrocodone to use only as absolutely needed), ibuprofen, seroquel, ketorolac oral and injections, we have prescribed Toradol injections for self administration at home, lasmiditan, Robaxin, Reglan, Zofran, prednisone tablets, Compazine tablets, Phenergan suppository, Seroquel, Nurtec, Zoloft, Topamax, Qudexy,Ubrelvy caused stomach discomfort. Lasmiditan did not work. allergic to celebrex, melatonin, she declines triptans(She had a great response to imitrex in the past but then had side effects which were likely due to the vasococonstrictive effects and possible TIA so contraindicated - also tried maxalt, treximet.), Amitrip, nortrip,  venlafaxine.  Blood pressure medications in the past  have been contraindicated due to her very low blood pressure and pulse and tried propranolol and had to discontinue due to side effects. She uses caffeine, Tylenol, Advil and Fioricet in that order to treat her acute headaches. In the past she has tried [place] with minimal benefit. Baclofen in the past helped but decreased her milk supply may consider trying again since she is no longer breast feeding. Has not tried Lyrica but Gabapentin made her swollen and dizzy. She does not want to try cymbalta because her son tried it and became suicidal. Her blood pressure is a lot better and now she is not bradycardic so next could also consider trying propranolol(tried, had to stop it due to side effects) tried IR and ER propranolol.   01/23/2022: Stable, the gabapentin made her dizzy but helped sleep. We will try propranolol at bedtime but watch very carefully for bradycardia and hypotension. If tolerated cn change to 60mg  ER at bedtime.   No orders of the defined types were placed in this encounter.   10/24/2021: Doing extremely well on botox > 70% improvement in frequency and severity of migraines and headaches per patient today.Ubrelvy caused stomach discomfort. Lasmiditan did not work. Tried Nurtec. Continue Fioricet, only acute med that works. She is not a clencher and she has no pain behind her eyes. We try to position the occipital and cervical injections "around the pony tail area" and at occipitalis more using "follow the pain protocol"     Reviewed orally with patient, additionally signature is on file:Consent Form Botulism Toxin Injection For Chronic Migraine    Reviewed orally with patient, additionally signature is on file:  Botulism toxin has been approved by the Federal drug administration for treatment of chronic migraine. Botulism  toxin does not cure chronic migraine and it may not be effective in some patients.  The administration of botulism toxin is accomplished by injecting a small  amount of toxin into the muscles of the neck and head. Dosage must be titrated for each individual. Any benefits resulting from botulism toxin tend to wear off after 3 months with a repeat injection required if benefit is to be maintained. Injections are usually done every 3-4 months with maximum effect peak achieved by about 2 or 3 weeks. Botulism toxin is expensive and you should be sure of what costs you will incur resulting from the injection.  The side effects of botulism toxin use for chronic migraine may include:   -Transient, and usually mild, facial weakness with facial injections  -Transient, and usually mild, head or neck weakness with head/neck injections  -Reduction or loss of forehead facial animation due to forehead muscle weakness  -Eyelid drooping  -Dry eye  -Pain at the site of injection or bruising at the site of injection  -Double vision  -Potential unknown long term risks  Contraindications: You should not have Botox if you are pregnant, nursing, allergic to albumin, have an infection, skin condition, or muscle weakness at the site of the injection, or have myasthenia gravis, Lambert-Eaton syndrome, or ALS.  It is also possible that as with any injection, there may be an allergic reaction or no effect from the medication. Reduced effectiveness after repeated injections is sometimes seen and rarely infection at the injection site may occur. All care will be taken to prevent these side effects. If therapy is given over a long time, atrophy and wasting in the muscle injected may occur. Occasionally the patient's become refractory to treatment because they develop antibodies to the toxin. In this event, therapy needs to be modified.  I have read the above information and consent to the administration of botulism toxin.    BOTOX PROCEDURE NOTE FOR MIGRAINE HEADACHE    Contraindications and precautions discussed with patient(above). Aseptic procedure was observed and patient  tolerated procedure. Procedure performed by Dr. Georgia Dom  The condition has existed for more than 6 months, and pt does not have a diagnosis of ALS, Myasthenia Gravis or Lambert-Eaton Syndrome.  Risks and benefits of injections discussed and pt agrees to proceed with the procedure.  Written consent obtained  These injections are medically necessary. Pt  receives good benefits from these injections. These injections do not cause sedations or hallucinations which the oral therapies may cause.  Description of procedure:  The patient was placed in a sitting position. The standard protocol was used for Botox as follows, with 5 units of Botox injected at each site:   -Procerus muscle, midline injection  -Corrugator muscle, bilateral injection  -Frontalis muscle, bilateral injection, with 2 sites each side, medial injection was performed in the upper one third of the frontalis muscle, in the region vertical from the medial inferior edge of the superior orbital rim. The lateral injection was again in the upper one third of the forehead vertically above the lateral limbus of the cornea, 1.5 cm lateral to the medial injection site.  -Temporalis muscle injection, 4 sites, bilaterally. The first injection was 3 cm above the tragus of the ear, second injection site was 1.5 cm to 3 cm up from the first injection site in line with the tragus of the ear. The third injection site was 1.5-3 cm forward between the first 2 injection sites. The fourth injection site was 1.5 cm  posterior to the second injection site.   -Occipitalis muscle injection, 3 sites, bilaterally. The first injection was done one half way between the occipital protuberance and the tip of the mastoid process behind the ear. The second injection site was done lateral and superior to the first, 1 fingerbreadth from the first injection. The third injection site was 1 fingerbreadth superiorly and medially from the first injection site.  -Cervical  paraspinal muscle injection, 2 sites, bilateral knee first injection site was 1 cm from the midline of the cervical spine, 3 cm inferior to the lower border of the occipital protuberance. The second injection site was 1.5 cm superiorly and laterally to the first injection site.  -Trapezius muscle injection was performed at 3 sites, bilaterally. The first injection site was in the upper trapezius muscle halfway between the inflection point of the neck, and the acromion. The second injection site was one half way between the acromion and the first injection site. The third injection was done between the first injection site and the inflection point of the neck.   Will return for repeat injection in 3 months.   155  units of Botox was used, 45 U Botox not injected was wasted. The patient tolerated the procedure well, there were no complications of the above procedure.

## 2022-04-19 ENCOUNTER — Ambulatory Visit: Payer: Medicare Other | Admitting: Adult Health

## 2022-04-30 ENCOUNTER — Encounter: Payer: Self-pay | Admitting: Medical-Surgical

## 2022-04-30 ENCOUNTER — Ambulatory Visit (INDEPENDENT_AMBULATORY_CARE_PROVIDER_SITE_OTHER): Payer: Medicare Other | Admitting: Medical-Surgical

## 2022-04-30 VITALS — BP 134/88 | HR 100 | Resp 20 | Ht 67.0 in | Wt 187.3 lb

## 2022-04-30 DIAGNOSIS — R635 Abnormal weight gain: Secondary | ICD-10-CM

## 2022-04-30 DIAGNOSIS — R748 Abnormal levels of other serum enzymes: Secondary | ICD-10-CM | POA: Diagnosis not present

## 2022-04-30 DIAGNOSIS — G43409 Hemiplegic migraine, not intractable, without status migrainosus: Secondary | ICD-10-CM

## 2022-04-30 DIAGNOSIS — F339 Major depressive disorder, recurrent, unspecified: Secondary | ICD-10-CM

## 2022-04-30 DIAGNOSIS — E786 Lipoprotein deficiency: Secondary | ICD-10-CM

## 2022-04-30 MED ORDER — OMEPRAZOLE 40 MG PO CPDR: 40.0000 mg | DELAYED_RELEASE_CAPSULE | Freq: Every day | ORAL | 3 refills | Status: DC

## 2022-04-30 NOTE — Progress Notes (Signed)
Established Patient Office Visit  Subjective   Patient ID: Holly [name], female   DOB: May 22, 1976 Age: 46 y.o. MRN: [id number]   Chief Complaint  Patient presents with   Transitions Of Care    HPI Pleasant 46 year old female presenting today to transfer care to a new PCP and for the following:  Migraine: taking topiramate150mg  daily. Not tolerating well due to severe IBS side effects. Negatively affecting mental health but has noted increased lability of emotions and poor sleep. Has had no other changes aside from switching from brand name Trokendi to the generic. Followed by neurology.   IBS: see above. No constipation now. Having multiple BM's in short periods of time. Sometimes sees only mucus but no stool.   Mood: taking fluoxetine 30mg  daily. Tolerating well without side effects. Prior to the switch to generic Trokendi, this was working well for her. Right now, having issues as above.  Denies SI/HI.  Weight gain: siginificant weight gain over the past months and is very worried about this. Irregular periods. Family history of thyroid disease. Previously followed by Dr. with endocrinology but has not seen him in a long time.  Would like to have her thyroid checked to see if this may be contributing to her symptoms.  Review of Systems  Constitutional:  Positive for malaise/fatigue. Negative for chills, fever and weight loss.  Respiratory:  Negative for cough, shortness of breath and wheezing.   Cardiovascular:  Negative for chest pain, palpitations and leg swelling.  Gastrointestinal:  Positive for abdominal pain and diarrhea.  Neurological:  Positive for headaches. Negative for dizziness.  Psychiatric/Behavioral:  Positive for depression. Negative for suicidal ideas. The patient is nervous/anxious and has insomnia.     Objective:    Vitals:   04/30/22 1524  BP: 134/88  Pulse: 100  Resp: 20  Height: 5\' 7"  (1.702 m)  Weight: 187 lb 4.8 oz (85 kg)  SpO2: 99%   BMI (Calculated): 29.33   Physical Exam Vitals and nursing note reviewed.  Constitutional:      General: She is not in acute distress.    Appearance: Normal appearance. She is not ill-appearing.  HENT:     Head: Normocephalic and atraumatic.  Cardiovascular:     Rate and Rhythm: Normal rate and regular rhythm.     Pulses: Normal pulses.     Heart sounds: Normal heart sounds.  Pulmonary:     Effort: Pulmonary effort is normal. No respiratory distress.     Breath sounds: Normal breath sounds. No wheezing, rhonchi or rales.  Skin:    General: Skin is warm and dry.  Neurological:     Mental Status: She is alert and oriented to person, place, and time.  Psychiatric:        Mood and Affect: Mood normal.        Behavior: Behavior normal.        Thought Content: Thought content normal.        Judgment: Judgment normal.   No results found for this or any previous visit (from the past 24 hour(s)).     The 10-year ASCVD risk score (Arnett DK, et al., 2019) is: 1.1%   Values used to calculate the score:     Age: 89 years     Sex: Female     Is Non-Hispanic African American: No     Diabetic: No     Tobacco smoker: No     Systolic Blood Pressure: 134 mmHg  Is BP treated: No     HDL Cholesterol: 35 mg/dL     Total Cholesterol: 160 mg/dL   Assessment & Plan:   1. Weight gain Checking labs as below.  Unclear etiology.  Recommend monitoring dietary intake and working to get regular intentional exercise at least 3 times weekly. - Thyroid Panel With TSH - Thyroid peroxidase antibody - Hemoglobin A1c  2. Depression, recurrent (HCC) Continue fluoxetine 30 mg daily.  3. Hemiplegic migraine without status migrainosus, not intractable Managed by neurology.  Recommend reaching out to them regarding the switch from generic to brand name Trokendi.  4. Elevated liver enzymes Checking CMP. - COMPLETE METABOLIC PANEL WITH GFR  5. Low HDL (under 40) Checking lipid panel. - Lipid  panel  Return in about 3 months (around 07/31/2022) for chronic disease follow up.  ___________________________________________ Thayer Ohm, DNP, APRN, FNP-BC Primary Care and Sports Medicine [facility]

## 2022-05-01 ENCOUNTER — Encounter: Payer: Self-pay | Admitting: Neurology

## 2022-05-01 LAB — HEMOGLOBIN A1C
Hgb A1c MFr Bld: 5 % of total Hgb (ref <5.7)
Mean Plasma Glucose: 97 mg/dL
eAG (mmol/L): 5.4 mmol/L

## 2022-05-01 LAB — COMPLETE METABOLIC PANEL WITH GFR
AG Ratio: 1.5 (calc) (ref 1.0–2.5)
ALT: 7 U/L (ref 6–29)
AST: 12 U/L (ref 10–30)
Albumin: 4.3 g/dL (ref 3.6–5.1)
Alkaline phosphatase (APISO): 75 U/L (ref 31–125)
BUN/Creatinine Ratio: 5 (calc) — ABNORMAL LOW (ref 6–22)
BUN: 5 mg/dL — ABNORMAL LOW (ref 7–25)
CO2: 23 mmol/L (ref 20–32)
Calcium: 9 mg/dL (ref 8.6–10.2)
Chloride: 110 mmol/L (ref 98–110)
Creat: 0.94 mg/dL (ref 0.50–0.99)
Globulin: 2.8 g/dL (calc) (ref 1.9–3.7)
Glucose, Bld: 104 mg/dL (ref 65–139)
Potassium: 4.1 mmol/L (ref 3.5–5.3)
Sodium: 141 mmol/L (ref 135–146)
Total Bilirubin: 0.3 mg/dL (ref 0.2–1.2)
Total Protein: 7.1 g/dL (ref 6.1–8.1)
eGFR: 77 mL/min/{1.73_m2} (ref >=60)

## 2022-05-01 LAB — LIPID PANEL
Cholesterol: 189 mg/dL (ref <200)
HDL: 36 mg/dL — ABNORMAL LOW (ref >=50)
LDL Cholesterol (Calc): 125 mg/dL (calc) — ABNORMAL HIGH
Non-HDL Cholesterol (Calc): 153 mg/dL (calc) — ABNORMAL HIGH (ref <130)
Total CHOL/HDL Ratio: 5.3 (calc) — ABNORMAL HIGH (ref <5.0)
Triglycerides: 166 mg/dL — ABNORMAL HIGH (ref <150)

## 2022-05-01 LAB — THYROID PANEL WITH TSH
Free Thyroxine Index: 2.2 (ref 1.4–3.8)
T3 Uptake: 28 % (ref 22–35)
T4, Total: 7.8 ug/dL (ref 5.1–11.9)
TSH: 0.9 mIU/L

## 2022-05-01 LAB — THYROID PEROXIDASE ANTIBODY: Thyroperoxidase Ab SerPl-aCnc: <1 IU/mL (ref <9)

## 2022-05-01 MED ORDER — TROKENDI XR 50 MG PO CP24: ORAL_CAPSULE | ORAL | 3 refills | Status: DC

## 2022-05-01 MED ORDER — TROKENDI XR 100 MG PO CP24: ORAL_CAPSULE | ORAL | 3 refills | Status: DC

## 2022-05-01 NOTE — Telephone Encounter (Signed)
From Dr. Charna Archer note:  04-30-2022:   Migraine: taking topiramate150mg  daily. Not tolerating well due to severe IBS side effects. Negatively affecting mental health but has noted increased lability of emotions and poor sleep. Has had no other changes aside from switching from brand name Trokendi to the generic. Followed by neurology. Consider trying to reauthorize to [facility] trokendi?

## 2022-05-01 NOTE — Telephone Encounter (Signed)
Orders written for brand Trokendi XR 50 mg and 100 mg capsules instead of generic.

## 2022-05-25 ENCOUNTER — Other Ambulatory Visit: Payer: Self-pay | Admitting: Neurology

## 2022-06-04 ENCOUNTER — Other Ambulatory Visit: Payer: Self-pay | Admitting: Neurology

## 2022-06-04 MED ORDER — HYDROCODONE-ACETAMINOPHEN 5-325 MG PO TABS: 1.0000 | ORAL_TABLET | Freq: Four times a day (QID) | ORAL | 0 refills | Status: DC | PRN

## 2022-06-09 ENCOUNTER — Other Ambulatory Visit: Payer: Self-pay | Admitting: Neurology

## 2022-06-09 MED ORDER — HYDROCODONE-ACETAMINOPHEN 5-325 MG PO TABS: 1.0000 | ORAL_TABLET | Freq: Four times a day (QID) | ORAL | 0 refills | Status: DC | PRN

## 2022-06-13 ENCOUNTER — Ambulatory Visit (INDEPENDENT_AMBULATORY_CARE_PROVIDER_SITE_OTHER): Payer: Medicare Other | Admitting: Medical-Surgical

## 2022-06-13 ENCOUNTER — Encounter: Payer: Self-pay | Admitting: Medical-Surgical

## 2022-06-13 VITALS — BP 119/76 | HR 96 | Resp 20 | Ht 67.0 in | Wt 183.0 lb

## 2022-06-13 DIAGNOSIS — L02214 Cutaneous abscess of groin: Secondary | ICD-10-CM | POA: Diagnosis not present

## 2022-06-13 MED ORDER — SULFAMETHOXAZOLE-TRIMETHOPRIM 800-160 MG PO TABS: 1.0000 | ORAL_TABLET | Freq: Two times a day (BID) | ORAL | 0 refills | Status: DC

## 2022-06-13 NOTE — Progress Notes (Signed)
   Established Patient Office Visit  Subjective   Patient ID: Holly [name], female   DOB: Jan 04, 1976 Age: 46 y.o. MRN: [id number]   Chief Complaint  Patient presents with   Migraine   HPI 47 year old female presenting today for evaluation of an "infected cyst" on her panty line.  This is a recurrence of her previous right groin abscess.  Notes that it popped up over the last couple weeks however it did start draining on its own.  Drainage is yellowish-green, thick.  Unsure if there is a foul odor since her sense of smell is affected by her chronic migraines.  No fevers, chills, or myalgias.  She is interested in treating the infection for now but would ultimately like to have this surgically addressed so it does not recur.  Notes this is the fourth time it happened this year and the last time the doxycycline helped but did not fully take away until she had a second round.  Does note that it interferes with her ability to comfortably wear underwear as well as bathing suit.   Objective:    Vitals:   06/13/22 0919  BP: 119/76  Pulse: 96  Resp: 20  Height: 5\' 7"  (1.702 m)  Weight: 183 lb (83 kg)  SpO2: 100%  BMI (Calculated): 28.66    Physical Exam Vitals and nursing note reviewed.  Constitutional:      General: She is not in acute distress.    Appearance: Normal appearance. She is not ill-appearing.  HENT:     Head: Normocephalic and atraumatic.  Cardiovascular:     Rate and Rhythm: Normal rate and regular rhythm.  Pulmonary:     Effort: Pulmonary effort is normal. No respiratory distress.  Skin:    General: Skin is warm and dry.       Neurological:     Mental Status: She is alert and oriented to person, place, and time.  Psychiatric:        Mood and Affect: Mood normal.        Behavior: Behavior normal.        Thought Content: Thought content normal.        Judgment: Judgment normal.   No results found for this or any previous visit (from the past 24 hour(s)).      The 10-year ASCVD risk score (Arnett DK, et al., 2019) is: 1.1%   Values used to calculate the score:     Age: 45 years     Sex: Female     Is Non-Hispanic African American: No     Diabetic: No     Tobacco smoker: No     Systolic Blood Pressure: 119 mmHg     Is BP treated: No     HDL Cholesterol: 36 mg/dL     Total Cholesterol: 189 mg/dL   Assessment & Plan:   1. Abscess of groin, right Purulent drainage from recurrent right groin abscess along with induration, tenderness, and erythema.  Treating with Bactrim twice daily x7 days.  Referring to dermatology for further evaluation and possible surgical intervention - Ambulatory referral to Dermatology  Return if symptoms worsen or fail to improve.  ___________________________________________ 57, DNP, APRN, FNP-BC Primary Care and Sports Medicine [facility]

## 2022-07-10 ENCOUNTER — Ambulatory Visit (INDEPENDENT_AMBULATORY_CARE_PROVIDER_SITE_OTHER): Payer: Medicare Other | Admitting: Neurology

## 2022-07-10 DIAGNOSIS — G43711 Chronic migraine without aura, intractable, with status migrainosus: Secondary | ICD-10-CM | POA: Diagnosis not present

## 2022-07-10 MED ORDER — ZAVEGEPANT HCL 10 MG/ACT NA SOLN: 1.0000 | Freq: Every day | NASAL | 11 refills | Status: DC | PRN

## 2022-07-10 MED ORDER — ONABOTULINUMTOXINA 200 UNITS IJ SOLR
155.0000 [IU] | Freq: Once | INTRAMUSCULAR | Status: AC
  Administered 2022-07-10: 155 [IU] via INTRAMUSCULAR

## 2022-07-10 NOTE — Progress Notes (Signed)
Consent Form Botulism Toxin Injection For Chronic Migraine    07/10/2022: stable on botox. Will try Zevegepant for acute management for migraines.   Meds ordered this encounter  Medications   botulinum toxin Type A (BOTOX) injection 155 Units    Botox- 200 units x 1 vial Lot: [id number] Expiration: 12/2024 NDC: 5409-8119-14  Bacteriostatic 0.9% Sodium Chloride- 52mL total Lot: [id number] Expiration: 06/20/2023 NDC: 7829-5621-30  Dx: G43.711  B/B   Zavegepant HCl 10 MG/ACT SOLN    Sig: Place 1 spray into the nose daily as needed.    Dispense:  8 each    Refill:  11    Patient has a copay card     04/17/2022: >50% improvement in migraine freq and severity. She is not a clencher and she has no pain behind her eyes. We try to position the occipital and cervical injections "around the pony tail area" and at occipitalis more using "follow the pain protocol". Stopped the propranolol, she had an episode of hemiplegia with migraine had EMS come and resolved. Baclofen is working well. It has been a very long time since she had a severe migraine like that, she can get numbness and hemiplegia, this one happened the 18th of April. She took fioircet and toradol and all of her rescue meds.   when the new nasal spray comes out will try it, triptans contraindicated  From 01/02/2022 visit: From a current review of records, medications tried in the past that can be used in migraine management include Tylenol, baclofen(did well but impaired her milk when breastfeeding), currently on Botox every 12 weeks, currently on limited Fioricet (limited supply every month, discussed rebound extensively with patient), Flexeril, Medrol Dosepak, Decadron injections, Benadryl, Aimovig, Prozac, Emgality, on a very limited supply of hydrocodone (again she is given a very limited supply of hydrocodone to use only as absolutely needed), ibuprofen, seroquel, ketorolac oral and injections, we have prescribed Toradol injections  for self administration at home, lasmiditan, Robaxin, Reglan, Zofran, prednisone tablets, Compazine tablets, Phenergan suppository, Seroquel, Nurtec, Zoloft, Topamax, Qudexy,Ubrelvy caused stomach discomfort. Lasmiditan did not work. allergic to celebrex, melatonin, she declines triptans(She had a great response to imitrex in the past but then had side effects which were likely due to the vasococonstrictive effects and possible TIA so contraindicated - also tried maxalt, treximet.), Amitrip, nortrip,  venlafaxine.  Blood pressure medications in the past have been contraindicated due to her very low blood pressure and pulse and tried propranolol and had to discontinue due to side effects. She uses caffeine, Tylenol, Advil and Fioricet in that order to treat her acute headaches. In the past she has tried [place] with minimal benefit. Baclofen in the past helped but decreased her milk supply may consider trying again since she is no longer breast feeding. Has not tried Lyrica but Gabapentin made her swollen and dizzy. She does not want to try cymbalta because her son tried it and became suicidal. Her blood pressure is a lot better and now she is not bradycardic so next could also consider trying propranolol(tried, had to stop it due to side effects) tried IR and ER propranolol.   01/23/2022: Stable, the gabapentin made her dizzy but helped sleep. We will try propranolol at bedtime but watch very carefully for bradycardia and hypotension. If tolerated cn change to 60mg  ER at bedtime.   Meds ordered this encounter  Medications   botulinum toxin Type A (BOTOX) injection 155 Units    Botox- 200 units x 1 vial  Lot: [id number] Expiration: 12/2024 NDC: 9024-0973-53  Bacteriostatic 0.9% Sodium Chloride- 88mL total Lot: [id number] Expiration: 06/20/2023 NDC: 2992-4268-34  Dx: H96.222  B/B    10/24/2021: Doing extremely well on botox > 70% improvement in frequency and severity of migraines and headaches per patient  today.Ubrelvy caused stomach discomfort. Lasmiditan did not work. Tried Nurtec. Continue Fioricet, only acute med that works. She is not a clencher and she has no pain behind her eyes. We try to position the occipital and cervical injections "around the pony tail area" and at occipitalis more using "follow the pain protocol"     Reviewed orally with patient, additionally signature is on file:Consent Form Botulism Toxin Injection For Chronic Migraine    Reviewed orally with patient, additionally signature is on file:  Botulism toxin has been approved by the Federal drug administration for treatment of chronic migraine. Botulism toxin does not cure chronic migraine and it may not be effective in some patients.  The administration of botulism toxin is accomplished by injecting a small amount of toxin into the muscles of the neck and head. Dosage must be titrated for each individual. Any benefits resulting from botulism toxin tend to wear off after 3 months with a repeat injection required if benefit is to be maintained. Injections are usually done every 3-4 months with maximum effect peak achieved by about 2 or 3 weeks. Botulism toxin is expensive and you should be sure of what costs you will incur resulting from the injection.  The side effects of botulism toxin use for chronic migraine may include:   -Transient, and usually mild, facial weakness with facial injections  -Transient, and usually mild, head or neck weakness with head/neck injections  -Reduction or loss of forehead facial animation due to forehead muscle weakness  -Eyelid drooping  -Dry eye  -Pain at the site of injection or bruising at the site of injection  -Double vision  -Potential unknown long term risks  Contraindications: You should not have Botox if you are pregnant, nursing, allergic to albumin, have an infection, skin condition, or muscle weakness at the site of the injection, or have myasthenia gravis, Lambert-Eaton  syndrome, or ALS.  It is also possible that as with any injection, there may be an allergic reaction or no effect from the medication. Reduced effectiveness after repeated injections is sometimes seen and rarely infection at the injection site may occur. All care will be taken to prevent these side effects. If therapy is given over a long time, atrophy and wasting in the muscle injected may occur. Occasionally the patient's become refractory to treatment because they develop antibodies to the toxin. In this event, therapy needs to be modified.  I have read the above information and consent to the administration of botulism toxin.    BOTOX PROCEDURE NOTE FOR MIGRAINE HEADACHE    Contraindications and precautions discussed with patient(above). Aseptic procedure was observed and patient tolerated procedure. Procedure performed by Dr. Artemio Aly  The condition has existed for more than 6 months, and pt does not have a diagnosis of ALS, Myasthenia Gravis or Lambert-Eaton Syndrome.  Risks and benefits of injections discussed and pt agrees to proceed with the procedure.  Written consent obtained  These injections are medically necessary. Pt  receives good benefits from these injections. These injections do not cause sedations or hallucinations which the oral therapies may cause.  Description of procedure:  The patient was placed in a sitting position. The standard protocol was used for Botox as follows,  with 5 units of Botox injected at each site:   -Procerus muscle, midline injection  -Corrugator muscle, bilateral injection  -Frontalis muscle, bilateral injection, with 2 sites each side, medial injection was performed in the upper one third of the frontalis muscle, in the region vertical from the medial inferior edge of the superior orbital rim. The lateral injection was again in the upper one third of the forehead vertically above the lateral limbus of the cornea, 1.5 cm lateral to the medial  injection site.  -Temporalis muscle injection, 4 sites, bilaterally. The first injection was 3 cm above the tragus of the ear, second injection site was 1.5 cm to 3 cm up from the first injection site in line with the tragus of the ear. The third injection site was 1.5-3 cm forward between the first 2 injection sites. The fourth injection site was 1.5 cm posterior to the second injection site.   -Occipitalis muscle injection, 3 sites, bilaterally. The first injection was done one half way between the occipital protuberance and the tip of the mastoid process behind the ear. The second injection site was done lateral and superior to the first, 1 fingerbreadth from the first injection. The third injection site was 1 fingerbreadth superiorly and medially from the first injection site.  -Cervical paraspinal muscle injection, 2 sites, bilateral knee first injection site was 1 cm from the midline of the cervical spine, 3 cm inferior to the lower border of the occipital protuberance. The second injection site was 1.5 cm superiorly and laterally to the first injection site.  -Trapezius muscle injection was performed at 3 sites, bilaterally. The first injection site was in the upper trapezius muscle halfway between the inflection point of the neck, and the acromion. The second injection site was one half way between the acromion and the first injection site. The third injection was done between the first injection site and the inflection point of the neck.   Will return for repeat injection in 3 months.   155  units of Botox was used, 45 U Botox not injected was wasted. The patient tolerated the procedure well, there were no complications of the above procedure.

## 2022-07-10 NOTE — Progress Notes (Signed)
Botox- 200 units x 1 vial Lot: [id number] Expiration: 12/2024 NDC: 0023-3921-02  Bacteriostatic 0.9% Sodium Chloride- 4mL total Lot: [id number] Expiration: 06/20/2023 NDC: 0409-1966-02  Dx: G43.711  B/B  

## 2022-07-11 ENCOUNTER — Encounter: Payer: Self-pay | Admitting: General Practice

## 2022-07-12 ENCOUNTER — Telehealth: Payer: Self-pay | Admitting: *Deleted

## 2022-07-12 NOTE — Telephone Encounter (Addendum)
Completed Zavzpret PA on Cover My Meds. Key: [id number]. PA Case ID: [id number] - Rx #: [id number]. Awaiting determination from [employer] Part D. Faxed office note to Cover My Meds. Received a receipt of confirmation.

## 2022-07-12 NOTE — Telephone Encounter (Signed)
Approved today Request Reference Number: [id number]. ZAVZPRET SPR 10MG  is approved through 11/18/2022. Your patient may now fill this prescription and it will be covered.

## 2022-07-19 ENCOUNTER — Other Ambulatory Visit: Payer: Self-pay | Admitting: Neurology

## 2022-07-24 ENCOUNTER — Other Ambulatory Visit: Payer: Self-pay | Admitting: Family Medicine

## 2022-07-25 ENCOUNTER — Encounter: Payer: Self-pay | Admitting: *Deleted

## 2022-07-30 NOTE — Telephone Encounter (Signed)
Letter emailed securely to patient at [email or web address]

## 2022-07-31 ENCOUNTER — Ambulatory Visit (INDEPENDENT_AMBULATORY_CARE_PROVIDER_SITE_OTHER): Payer: Self-pay | Admitting: Medical-Surgical

## 2022-07-31 DIAGNOSIS — Z91199 Patient's noncompliance with other medical treatment and regimen due to unspecified reason: Secondary | ICD-10-CM

## 2022-07-31 DIAGNOSIS — F339 Major depressive disorder, recurrent, unspecified: Secondary | ICD-10-CM

## 2022-07-31 DIAGNOSIS — M797 Fibromyalgia: Secondary | ICD-10-CM

## 2022-07-31 NOTE — Progress Notes (Signed)
   Established Patient Office Visit  Subjective   Patient ID: DORTHULA LISLE, female   DOB: [birth year] Age: 46 y.o. MRN: [id number]   No chief complaint on file.   HPI Pleasant 46 year old female presenting today for the following:  Mood:  Fibromyalgia:   Objective:    There were no vitals filed for this visit.  Physical Exam   No results found for this or any previous visit (from the past 24 hour(s)).   {Labs (Optional):23779}  The 10-year ASCVD risk score (Arnett DK, et al., 2019) is: 1.1%   Values used to calculate the score:     Age: 33 years     Sex: Female     Is Non-Hispanic African American: No     Diabetic: No     Tobacco smoker: No     Systolic Blood Pressure: 119 mmHg     Is BP treated: No     HDL Cholesterol: 36 mg/dL     Total Cholesterol: 189 mg/dL   Assessment & Plan:   No problem-specific Assessment & Plan notes found for this encounter.   No follow-ups on file.  ___________________________________________ Thayer Ohm, DNP, APRN, FNP-BC Primary Care and Sports Medicine [facility]

## 2022-08-15 ENCOUNTER — Ambulatory Visit: Payer: Medicare Other | Admitting: Neurology

## 2022-08-30 ENCOUNTER — Telehealth: Payer: Self-pay | Admitting: Neurology

## 2022-08-30 ENCOUNTER — Telehealth (INDEPENDENT_AMBULATORY_CARE_PROVIDER_SITE_OTHER): Payer: Medicare Other | Admitting: Neurology

## 2022-08-30 ENCOUNTER — Encounter: Payer: Self-pay | Admitting: Neurology

## 2022-08-30 DIAGNOSIS — G43711 Chronic migraine without aura, intractable, with status migrainosus: Secondary | ICD-10-CM

## 2022-08-30 DIAGNOSIS — R51 Headache with orthostatic component, not elsewhere classified: Secondary | ICD-10-CM | POA: Diagnosis not present

## 2022-08-30 DIAGNOSIS — R531 Weakness: Secondary | ICD-10-CM

## 2022-08-30 DIAGNOSIS — H539 Unspecified visual disturbance: Secondary | ICD-10-CM

## 2022-08-30 DIAGNOSIS — R200 Anesthesia of skin: Secondary | ICD-10-CM | POA: Diagnosis not present

## 2022-08-30 DIAGNOSIS — R519 Headache, unspecified: Secondary | ICD-10-CM

## 2022-08-30 DIAGNOSIS — R29898 Other symptoms and signs involving the musculoskeletal system: Secondary | ICD-10-CM

## 2022-08-30 DIAGNOSIS — R202 Paresthesia of skin: Secondary | ICD-10-CM

## 2022-08-30 DIAGNOSIS — W19XXXA Unspecified fall, initial encounter: Secondary | ICD-10-CM

## 2022-08-30 MED ORDER — "BD LUER-LOK SYRINGE 23G X 1"" 3 ML MISC": 11 refills | Status: DC

## 2022-08-30 MED ORDER — BACLOFEN 10 MG PO TABS: 10.0000 mg | ORAL_TABLET | Freq: Three times a day (TID) | ORAL | 11 refills | Status: DC | PRN

## 2022-08-30 MED ORDER — HYDROCODONE-ACETAMINOPHEN 5-325 MG PO TABS: 1.0000 | ORAL_TABLET | Freq: Four times a day (QID) | ORAL | 0 refills | Status: DC | PRN

## 2022-08-30 MED ORDER — BD SYRINGE LUER-LOK 3 ML MISC: 11 refills | Status: AC

## 2022-08-30 MED ORDER — PROCHLORPERAZINE MALEATE 10 MG PO TABS: 10.0000 mg | ORAL_TABLET | Freq: Four times a day (QID) | ORAL | 11 refills | Status: DC | PRN

## 2022-08-30 MED ORDER — BUTALBITAL-APAP-CAFFEINE 50-325-40 MG PO TABS: 1.0000 | ORAL_TABLET | Freq: Four times a day (QID) | ORAL | 5 refills | Status: DC | PRN

## 2022-08-30 MED ORDER — KETOROLAC TROMETHAMINE 60 MG/2ML IM SOLN: INTRAMUSCULAR | 11 refills | Status: DC

## 2022-08-30 MED ORDER — ONDANSETRON 8 MG PO TBDP: 8.0000 mg | ORAL_TABLET | Freq: Three times a day (TID) | ORAL | 11 refills | Status: DC | PRN

## 2022-08-30 MED ORDER — TROKENDI XR 50 MG PO CP24: ORAL_CAPSULE | ORAL | 3 refills | Status: DC

## 2022-08-30 MED ORDER — "NEEDLE (DISP) 23G X 1"" MISC": 11 refills | Status: AC

## 2022-08-30 MED ORDER — PROMETHAZINE HCL 25 MG RE SUPP: 25.0000 mg | Freq: Four times a day (QID) | RECTAL | 11 refills | Status: AC | PRN

## 2022-08-30 MED ORDER — TROKENDI XR 100 MG PO CP24: ORAL_CAPSULE | ORAL | 3 refills | Status: DC

## 2022-08-30 MED ORDER — IBUPROFEN 800 MG PO TABS: 800.0000 mg | ORAL_TABLET | Freq: Three times a day (TID) | ORAL | 6 refills | Status: DC | PRN

## 2022-08-30 NOTE — Telephone Encounter (Signed)
..   Pt understands that although there may be some limitations with this type of visit, we will take all precautions to reduce any security or privacy concerns.  Pt understands that this will be treated like an in office visit and we will file with pt's insurance, and there may be a patient responsible charge related to this service. ? ?

## 2022-08-30 NOTE — Telephone Encounter (Signed)
Dr Brett Fairy has order form to sign.

## 2022-08-30 NOTE — Telephone Encounter (Signed)
Please start the approval for Vyepti:  she still has > 10 migraine days a month and > 15 headache days a month. Botox still helps > 50% but still having residual chronic migraine will add on vyepti.  We have tried a plethora of medications.  We will try the vyept start 100mg  first injection and then increase to 300mg  if tolerated. She has tried all 3 injectables.    From a current review of records, medications tried in the past that can be used in migraine management include Tylenol, baclofen(did well but impaired her milk when breastfeeding), currently on Botox every 12 weeks, currently on limited Fioricet (limited supply every month, discussed rebound extensively with patient), Flexeril, Medrol Dosepak, Decadron injections, Benadryl, Aimovig, Prozac, Emgality, on a very limited supply of hydrocodone (again she is given a very limited supply of hydrocodone to use only as absolutely needed), ibuprofen, seroquel, ketorolac oral and injections, we have prescribed Toradol injections for self administration at home, lasmiditan, Robaxin, Reglan, Zofran, prednisone tablets, Compazine tablets, Phenergan suppository, Seroquel, Nurtec, Zoloft, Topamax, Qudexy,Ubrelvy caused stomach discomfort. Lasmiditan did not work. allergic to celebrex, melatonin, she declines triptans(She had a great response to imitrex in the past but then had side effects which were likely due to the vasococonstrictive effects and possible TIA so contraindicated - also tried maxalt, treximet.), Amitrip, nortrip,  venlafaxine.  Blood pressure medications in the past have been contraindicated due to her very low blood pressure and pulse. She uses caffeine, Tylenol, Advil and Fioricet in that order to treat her acute headaches. In the past she has tried [place] with minimal benefit. Baclofen in the past helped but decreased her milk supply may consider trying again since she is no longer breast feeding. Has not tried Lyrica, Gabapentin(made her  dizzy).. She does not want to try cymbalta because her son tried it and became suicidal. Her blood pressure is a lot better and now she is not bradycardic so next could also consider trying propranolol acutely or preventatively - tried propranolol 01/2022, Zevagepant made it worse

## 2022-08-30 NOTE — Telephone Encounter (Signed)
Received signed copy of order.  This was given to intrafusion 08-30-2022.

## 2022-08-30 NOTE — Progress Notes (Signed)
PATIENT: Holly [name] DOB: 05-30-1976  REASON FOR VISIT: follow up HISTORY FROM: patient  HISTORY OF PRESENT ILLNESS:  Virtual Visit via Video Note  I connected with Holly [name] on 08/30/22 at 10:45 AM EDT by a video enabled telemedicine application and verified that I am speaking with the correct person using two identifiers.  Location: Patient: home Provider: office   I discussed the limitations of evaluation and management by telemedicine and the availability of in person appointments. The patient expressed understanding and agreed to proceed.  Follow Up Instructions:    I discussed the assessment and treatment plan with the patient. The patient was provided an opportunity to ask questions and all were answered. The patient agreed with the plan and demonstrated an understanding of the instructions.   The patient was advised to call back or seek an in-person evaluation if the symptoms worsen or if the condition fails to improve as anticipated.  I provided over 40 minutes of non-face-to-face time during this encounter.   Anson Fret, MD  08/30/2022:  she still has > 10 migraine days a month and > 15 headache days a month. Botox still helps > 50% but still having residual chronic migraine will add on vyepti.  We have tried a plethora of medications. We stopped the propranolol due to a severe attack. She is taking baclofen still. She is on the trokendi 150mg  at bedtime still. Sometimes things are very bad and sometimes they get better. She gets a lot of right hand tingling, more falls, intractable headaches despite a plethora of medication and changes in management, headaches positional worse in the morning and nocturnal, also vision changes she is getting blurry vision very blurry in the morning and with the headaches. Discussed getting imaging of the brain. Try qulipta daily pill, vyepti every 3 months. We will try the vyept start 100mg  and then increae to 300mg  if  tolerated.   MRI brain due to concerning symptoms of morning headaches, positional headaches,right hand weakness and numbness, intractable headaches despite a plethora of medications and medication changes, vision changes, worsening headaches  to look for space occupying mass, chiari or intracranial hypertension (pseudotumor), strokes, malignancies, vasculidities, demyelination(multiple sclerosis) or other  Patient complains of symptoms per HPI as well as the following symptoms: migraines . Pertinent negatives and positives per HPI. All others negative    01/23/2022: Stable, the gabapentin made her dizzy but helped sleep. We will try propranolol at bedtime but watch very carefully for bradycardia and hypotension. If tolerated cn change to 60mg  ER at bedtime.   Interval history January 02, 2022: This is a patient we know very well since 2017 treated for chronic intractable hemiplegic migraines for years. She has botox injections every 12 weeks with me.  She has tried a plethora of medications (see below) she continues to have migraines monthly. Botox helps and gives her >50% improvement but she can still have severe migraine days and status migrainosus. Recently she asked for a Medrol Dosepak, she has been filling a Medrol Dosepak every month for at least a year for breakthrough severe migraines, and this appointment is to discuss better migraine management to decrease use of steroids. Her migraines have been worsening, she has a lot of stress, but she has been taking steroids monthly since July 2021 acutely and we discussed the long term sequela of prednisone use(she is quite knowledgeable herself). Migraines have picked on intensity and frequency. She had a migraine for 2 weeks, nothing  was relieving it, we discussed all her medications below and options she has not tried. We had a long discussion on medication she has not tried, medication she has tried, and we eventually decided to discontinue her  methocarbamol, try baclofen again for her muscle spasms during the day and for acute management of migraines as needed, try gabapentin at bedtime which may help with her migraines and fibromyalgia and also help her sleep, and increase her Trokendi to 150 mg for migraine preventative(she was at  with side effects and we had decreased to , we will increase to 150 and see how she does).  Also, her blood pressure is not as low as it used to be and she is often tachycardic in the 80s and 90s, this means propranolol could be something we could use as well, I will see her back in a month and see how this is going.  From a current review of records, medications tried in the past that can be used in migraine management include Tylenol, baclofen(did well but impaired her milk when breastfeeding), currently on Botox every 12 weeks, currently on limited Fioricet (limited supply every month, discussed rebound extensively with patient), Flexeril, Medrol Dosepak, Decadron injections, Benadryl, Aimovig, Prozac, Emgality(constipation), ajovy, aimovig,  on a very limited supply of hydrocodone (again she is given a very limited supply of hydrocodone to use only as absolutely needed), ibuprofen, seroquel, ketorolac oral and injections, we have prescribed Toradol injections for self administration at home, lasmiditan, Robaxin, Reglan, Zofran, prednisone tablets, Compazine tablets, Phenergan suppository, Seroquel, Nurtec, Zoloft, Topamax, Qudexy,Ubrelvy caused stomach discomfort. Lasmiditan did not work. allergic to celebrex, melatonin, she declines triptans(She had a great response to imitrex in the past but then had side effects which were likely due to the vasococonstrictive effects and possible TIA so contraindicated - also tried maxalt, treximet.), Amitrip, nortrip,  venlafaxine.  Blood pressure medications in the past have been contraindicated due to her very low blood pressure and pulse. She uses caffeine, Tylenol,  Advil and Fioricet in that order to treat her acute headaches. In the past she has tried [place] with minimal benefit. Baclofen in the past helped but decreased her milk supply may consider trying again since she is no longer breast feeding. Has not tried Lyrica, Gabapentin(made her dizzy). She does not want to try cymbalta because her son tried it and became suicidal. Her blood pressure is a lot better and now she is not bradycardic so next could also consider trying propranolol acutely or preventatively - tried propranolol. Zevagepant made it worse    Interval history 02/13/2019: This is a patient with chronic intractable hemiplegic migraines for many years. She has tried a plethora of medications including the new CGRP medications and now botox for migraines. She continues to suffer immensely with her migraines. The Bernita Raisin is wonderful but it knocks her out. She injected her emgality yesterday, her chest tightened up and she became dizzy. This has never happened. But she was having a migraine at the time of taking the medication. Unclear why this happened, but she says it was not serious or urgent. She had botox on 01/28/2019.  She refilled fioricet 3x in a 10-day period, she lost one prescription and one fell in the toilet and she was very upset, explained we absolutely trust her and we apologize. The last botox she had the worst migraine, she would like to have me do her injections. She feels the migraines becoming more manageable 50% better as manageable. She feels her  menses are worse on the Trokendi, she has a lot of pain. And a lot of mood swings and being irate. Decrease Trokendi to   She had a great response to imitrex in the past but then had side effects which were likely due to the vasococonstrictive effects so Lasmitidan may be a great medication.    Tpday 09/22/2018: Difficulty with insomnia, melatonin is not working, she has been on everything and has neck pain, memory foam with ergonomic  pillow. Her neck pain is  like someone hitting her in the back of the neck. She feels her mind is racing and keeps her awake but often it is the pain in the neck that stops her from sleeping. Discussed options, will Add botox on to the regimen. She has a lot pain and shooting/burning at the back of the neck possibly occipital neuralgia. She continues to have daily headaches. 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 No aura. No medication overuse. Can last 24-48 hours and are moderately severe to severe ongoing for years.   Today 08/30/22  Holly [name] is a 46 year old female with a history of migraine headaches.  She returns today for follow-up.  She remains on Trokendi and Fioricet.  She states that she has had 3 injections of Emgality.  She reports that her headache severity has definitely improved.  She continues to have a daily headache however since starting the injections her headaches are not lasting all day long.  She also reports that she is not having to go to urgent care as frequently as before.  She states that she has one severe migraine with hemiplegic symptoms at least once a week.  She states that she did have the LEAP procedure due to an abnormal Pap smear and she has significant bleeding afterwards and was placed on a progesterone tablet.  She reports for this reason her headaches were slightly worse during this time.  She has stopped the progesterone.  She also reports that with the injection she has noticed constipation within the first 10 days after the injection.  She states that she has tried Colace with no benefit.  She did try MiraLAX and that seemed to work better.  She returns today for an evaluation.  HISTORY  09/16/2017: She has been diagnosed with fibromyalgia. She is allergic to Celebrex. She is seeing her pcp and they may start Lyrica, which is also a migraine medication. She saw another doctor, who is a good neurologist and I recommend following with them.  She uses Fioricet which I don't prefer but it is the only think that helps her, she has been advised about rebound headache. She has also seen Dr. Catalina Lunger at the [facility].     Interval History 08/07/2016:  Holly [name] is a 46 y.o. 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 She has severe migraines what impair her both physicilly and cognitively, she can't even remember her own name, she can;t walk straight, her balance is impaired, she has slurred speech, she stutters, can;t take care of her 6 children or at times herself when recurrence of migraines. 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 The last several have been extreme in her severity. She has gone to the emergency room multiple times for severe refractory migraines. Patient's migraines have improved with Topiramate but they are still occurring  3-4 days a week and can be severe when she has a migraine 10 days in a row. She takes Topiramate 200mg  daily. She is breastfeeding. When she has migraines she is debilitated often and has to stay in bed in a dark room all day and her husband has had to stay home. She has 15-17 migraine days in a month. She is weaning her last child this last month and her hormones are fluctuating. Will have the Botox representative call.    Meds tried: Topiramate,Amitrip, nortrip. Currently on topamax 200mg  daily. Also tried venlafaxine, imitrex, maxalt, treximet. Blood pressure medications are contraindicated due to her very low blood pressure.    05/29/2016: Today 04/29/2016: Holly [name] is a 46 year old female with a history of migraine headaches. She returns today for follow-up. She states that her headaches have improved with Topamax. She states she typically has 1-2 headaches a week. Before she was having 3-5 headaches a week. She is currently taken  Topamax 150 mg at bedtime. She reports that with her migraines she is experiencing weakness on the right side typically in the arm but it can move to the leg. She also noticed slurring speech and stuttering. In the past the symptoms have been worrisome and she went to the emergency room thinking she was having a stroke. She was told that it was migraine headache. She reports that the symptoms never occur without a migraine. She uses caffeine, Tylenol, Advil and Fioricet in that order to treat her acute headaches. In the past she has tried triptans and Cambia with minimal benefit. She states baclofen was helpful for her headaches however it stopped her milk production. She plans to continue breast-feeding to the child is 18 months old. She returns today for an evaluation.       HPI 04/06/2016:  Holly [name] is a 46 y.o. female here as a referral from Dr. Merdis [name]. She has had migraines for 21 years. She has tried a lot of things. Migraines are affecting her speech and she is slurring stuttering with the migraines. She trained in nursing and psychology. She has daily headaches, migraines. They are on the left side 98% of the time, behind the eye, throbbing and pounding like a "pick axe", laying down in a dark room helps, endorses extreme sounds sensitivity, light sensitivity, smell triggers, no aura. 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 No medication overuse. She had extensive testing at the [facility] with MRIs and LPs. She has nausea and vomiting with the migraines. No inciting event, no head trauma, has a FHx of migraines.  Lack of sleep worsens, drinks caffeine once a day. She drink enough fluid. She was bedridden for a whole year with migraines. She has daily migraines that can last all day.No other focal neurologic deficits. She does have musculoskeletal neck pain.   She has tried everything: Only Fioricet and Vicodin helps.That is the only thing she takes. She tried Amitrip, nortrip.  Currently on topamax 75mg  daily. Also tried venlafaxine, imitrex, maxalt, treximet.    Never tried propranolol   Reviewed notes, labs and imaging from outside physicians, which showed:   CT if the head 2004: CLINICAL DATA:    HEADACHE.  RIGHT AND LEFT-SIDED NUMBNESS OF ARMS AND LEGS.  BLURRED VISION FOR TWO WEEKS.  TWO MONTHS POST PARTUM.  PREVIOUS MRI OF THE BRAIN IN 1998.CT SCAN OF THE HEAD WITHOUT CONTRAST WITH BONE WINDOWS A SERIES OF SCANS OF THE ENTIRE HEAD ARE MADE WITHOUT CONTRAST  AND SHOW NO EVIDENCE OF INTRACRANIAL MASS OR HEMORRHAGE.  THERE IS NO SHIFT OF MIDLINE STRUCTURES.  THE VENTRICULAR SYSTEM APPEARS NORMAL.  BONE WINDOWS SHOW THE PARANASAL SINUSES, BASE OF THE SKULL, BONY CALVARIUM, AND INTERNAL AUDITORY CANALS TO BE NORMAL. IMPRESSION NORMAL CT SCAN OF THE HEAD WITHOUT CONTRAST.   RPR, HIV negative.    Reviewed notes from OBGyn: Pt has had migraines for 20 years. Patient reports continuous migraine for 3 months. She is on Topamax 75mg  qhs. She has tunnel vision with migraines. Cries unconsolably with blood in her tears. She has diarrhea with the Topamax but wants to continue it. She is nursing her baby and is almost done, decreased sleep and fatigue is worsening migraines. 5 trips to the ER in the last 6 weeks. Imitrex gave her a panic attack and numbness in the face. Exam was unremarkable. On Zofran and Topiramate.     REVIEW OF SYSTEMS: Patient complains of symptoms per HPI as well as the following symptoms: migraines, stress . Pertinent negatives and positives per HPI. All others negative   ALLERGIES: Allergies  Allergen Reactions   Other Rash    Overly sleepy zyrtec   Terbutaline Palpitations   Emgality [Galcanezumab-Gnlm] Other (See Comments)    Per patient, rx now gives her GI problems and causes IBS symptoms to worsen.    Sumatriptan    Adhesive [Tape] Itching and Rash   Aimovig [Erenumab-Aooe] Rash   Celebrex [Celecoxib] Rash    03/17/18 patient is unsure,  thinks it may have been shaving cream   Cetirizine Hcl Other (See Comments)    Overly sleepy    Ciprofloxacin Rash    Swelling of knees    HOME MEDICATIONS: Outpatient Medications Prior to Visit  Medication Sig Dispense Refill   ALLERGY RELIEF/NASAL DECONGEST 10-240 MG 24 hr tablet TAKE ONE TABLET BY MOUTH DAILY 30 tablet 11   FLUoxetine (PROZAC) 10 MG capsule Take 1 capsule (10 mg total) by mouth daily. Take with 20 mg capsules for total daily dose 30 mg 90 capsule 3   FLUoxetine (PROZAC) 20 MG capsule Take 1 capsule (20 mg total) by mouth daily. 90 capsule 0   methylPREDNISolone (MEDROL DOSEPAK) 4 MG TBPK tablet Take tablets by mouth daily with food in the morning for 6 days 21 tablet 1   norethindrone (MICRONOR) 0.35 MG tablet Take 1 tablet (0.35 mg total) by mouth daily. 84 tablet 3   omeprazole (PRILOSEC) 40 MG capsule Take 1 capsule (40 mg total) by mouth daily. 90 capsule 3   sulfamethoxazole-trimethoprim (BACTRIM DS) 800-160 MG tablet Take 1 tablet by mouth 2 (two) times daily. 14 tablet 0   triamcinolone cream (KENALOG) 0.1 % Apply 1 application topically 2 (two) times daily. To affected area(s) as needed 45 g 1   Zavegepant HCl 10 MG/ACT SOLN Place 1 spray into the nose daily as needed. 8 each 11   baclofen (LIORESAL) 10 MG tablet Take 1 tablet (10 mg total) by mouth 3 (three) times daily as needed for muscle spasms. Or for migraines. 90 each 11   butalbital-acetaminophen-caffeine (FIORICET) 50-325-40 MG tablet Take 1-2 tablets by mouth every 6 (six) hours as needed for headache. 15 tablet 5   HYDROcodone-acetaminophen (NORCO/VICODIN) 5-325 MG tablet Take 1 tablet by mouth every 6 (six) hours as needed for moderate pain. 60 tablet 0   ibuprofen (ADVIL) 800 MG tablet Take 1 tablet (800 mg total) by mouth 3 (three) times daily with meals as needed for headache, moderate pain  or cramping. 90 tablet 6   ketorolac (TORADOL) 60 MG/2ML SOLN injection INJECT 2 ML INTRAMUSCULARLY EVERY 6  HOURS AS NEEDED FOR  MIGRANE. NO MORE THAN 120 MG (4ML) IN  24 HOURS AND NO MORE THAN 4 DAYS PER MONTH. 28 mL 6   NEEDLE, DISP, 23 G 23G X 1" MISC Attach to syringe to draw up and administer Toradol into the muscle. 4 each 6   ondansetron (ZOFRAN-ODT) 8 MG disintegrating tablet Take 1 tablet (8 mg total) by mouth every 8 (eight) hours as needed for nausea. 30 tablet 11   prochlorperazine (COMPAZINE) 10 MG tablet Take 1 tablet (10 mg total) by mouth every 6 (six) hours as needed. 30 tablet 11   promethazine (PHENERGAN) 25 MG suppository Place 1 suppository (25 mg total) rectally every 6 (six) hours as needed for nausea or vomiting. 12 each 11   Syringe, Disposable, (B-D SYRINGE LUER-LOK 3CC) 3 ML MISC Attach to needle to draw up and administer Toradol into the muscle. 4 each 6   SYRINGE-NEEDLE, DISP, 3 ML (B-D 3CC LUER-LOK SYR 23GX1") 23G X 1" 3 ML MISC Attach to syringe to draw up and administer Toradol into the muscle. 4 each 6   TROKENDI XR 100 MG CP24 Take 100 mg by mouth at bedtime. Take with a 50mg  capsule for a total of 150mg  at bedtme 90 capsule 3   TROKENDI XR 50 MG CP24 Take 50 mg by mouth at bedtime. Take with a 100mg  capsule for a total of 150mg  at bedtme. 90 capsule 3   No facility-administered medications prior to visit.    PAST MEDICAL HISTORY: Past Medical History:  Diagnosis Date   Depression    Eczema    scalp   Family history of adverse reaction to anesthesia    mother struggles with sob, slow to awaken, n/v bedridden for days   Fibromyalgia 2018   GERD (gastroesophageal reflux disease)    Hemiplegic migraine    IBS (irritable bowel syndrome)    Migraine    MRSA (methicillin resistant staph aureus) culture positive not sure when   Vaginal Pap smear, abnormal     PAST SURGICAL HISTORY: Past Surgical History:  Procedure Laterality Date   CERVICAL CONIZATION W/BX N/A 07/05/2020   Procedure: COLD KNIFE CONIZATION CERVIX WITH BIOPSY; COLPOSCOPY AND ENDOCERVICAL  CURRETAGE;  Surgeon: Conan Bowensavis, Kelly M, MD;  [facility];  Service: Gynecology;  Laterality: N/A;   COLPOSCOPY     x 3 with biopsy on 1 done   LEEP  2020   MULTIPLE TOOTH EXTRACTIONS     as child   WRIST SURGERY Left 2010   cyst removal    FAMILY HISTORY: Family History  Problem Relation Age of Onset   Thyroid disease Mother    Scoliosis Mother    Depression Mother    Migraines Mother    Hyperlipidemia Mother    Hypertension Mother    Heart attack Mother    Crohn's disease Father    Autoimmune disease Father    Factor V Leiden deficiency Father    Heart failure Father    Diabetes Father    Hyperlipidemia Father    Heart attack Father    Cancer Sister    Depression Sister    Diabetes Sister    Diabetes Paternal Grandfather     SOCIAL HISTORY: Social History   Socioeconomic History   Marital status: Married    Spouse name: Vincenza HewsShane   Number of children: 6  Years of education: 15   Highest education level: Some college, no degree  Occupational History   Occupation: Arts development officer  Tobacco Use   Smoking status: Never   Smokeless tobacco: Never  Vaping Use   Vaping Use: Never used  Substance and Sexual Activity   Alcohol use: No    Alcohol/week: 0.0 standard drinks of alcohol    Comment: none   Drug use: Never   Sexual activity: Yes    Birth control/protection: Pill  Other Topics Concern   Not on file  Social History Narrative   1 caffeine drink a day at max   Right handed   Lives at home with children. Husband is a Naval architect and rarely home.   Has a lot of migraines   Social Determinants of Health   Financial Resource Strain: Low Risk  (02/24/2019)   Overall Financial Resource Strain (CARDIA)    Difficulty of Paying Living Expenses: Not hard at all  Food Insecurity: No Food Insecurity (02/24/2019)   Hunger Vital Sign    Worried About Running Out of Food in the Last Year: Never true    Ran Out of Food in the Last Year: Never true   Transportation Needs: No Transportation Needs (02/24/2019)   PRAPARE - Administrator, Civil Service (Medical): No    Lack of Transportation (Non-Medical): No  Physical Activity: Inactive (02/24/2019)   Exercise Vital Sign    Days of Exercise per Week: 0 days    Minutes of Exercise per Session: 0 min  Stress: No Stress Concern Present (02/24/2019)   [employer] of Occupational Health - Occupational Stress Questionnaire    Feeling of Stress : Not at all  Social Connections: Moderately Isolated (02/24/2019)   Social Connection and Isolation Panel [NHANES]    Frequency of Communication with Friends and Family: Never    Frequency of Social Gatherings with Friends and Family: Never    Attends Religious Services: Never    Database administrator or Organizations: No    Attends Banker Meetings: Never    Marital Status: Married  Catering manager Violence: Not At Risk (02/24/2019)   Humiliation, Afraid, Rape, and Kick questionnaire    Fear of Current or Ex-Partner: No    Emotionally Abused: No    Physically Abused: No    Sexually Abused: No     Physical exam: Exam: Gen: NAD, conversant      CV: Could not perform over Web Video. Denies palpitations or chest pain or SOB. VS: Breathing at a normal rate. Weight appears within normal limits. Not febrile. Eyes: Conjunctivae clear without exudates or hemorrhage  Neuro: Detailed Neurologic Exam  Speech:    Speech is normal; fluent and spontaneous with normal comprehension.  Cognition:    The patient is oriented to person, place, and time;     recent and remote memory intact;     language fluent;     normal attention, concentration,     fund of knowledge Cranial Nerves:    The pupils are equal, round, and reactive to light. Cannot perform fundoscopic exam. Visual fields are full to finger confrontation. Extraocular movements are intact.  The face is symmetric with normal sensation. The palate elevates in the  midline. Hearing intact. Voice is normal. Shoulder shrug is normal. The tongue has normal motion without fasciculations.   Coordination:    Normal finger to nose  Gait:    Normal native gait  Motor Observation:   no involuntary  movements noted. Tone:    Appears normal  Posture:    Posture is normal. normal erect    Strength:    Strength is anti-gravity and symmetric in the upper and lower limbs.      Sensation: intact to LT      DIAGNOSTIC DATA (LABS, IMAGING, TESTING) - I reviewed patient records, labs, notes, testing and imaging myself where available.  Lab Results  Component Value Date   WBC 11.8 (H) 12/04/2021   HGB 12.6 12/04/2021   HCT 36.9 12/04/2021   MCV 89.8 12/04/2021   PLT 296 12/04/2021      Component Value Date/Time   NA 141 04/30/2022 0000   NA 143 12/20/2020 1633   K 4.1 04/30/2022 0000   CL 110 04/30/2022 0000   CO2 23 04/30/2022 0000   GLUCOSE 104 04/30/2022 0000   BUN 5 (L) 04/30/2022 0000   BUN 9 12/20/2020 1633   CREATININE 0.94 04/30/2022 0000   CALCIUM 9.0 04/30/2022 0000   PROT 7.1 04/30/2022 0000   PROT 7.4 12/20/2020 1633   ALBUMIN 4.5 12/20/2020 1633   AST 12 04/30/2022 0000   ALT 7 04/30/2022 0000   ALKPHOS 88 12/20/2020 1633   BILITOT 0.3 04/30/2022 0000   BILITOT 0.2 12/20/2020 1633   GFRNONAA 72 03/16/2021 0000   GFRAA 83 03/16/2021 0000   Lab Results  Component Value Date   CHOL 189 04/30/2022   HDL 36 (L) 04/30/2022   LDLCALC 125 (H) 04/30/2022   TRIG 166 (H) 04/30/2022   CHOLHDL 5.3 (H) 04/30/2022    Lab Results  Component Value Date   TSH 0.90 04/30/2022      ASSESSMENT AND PLAN 46 y.o. year old female  has a past medical history of Depression, Eczema, Family history of adverse reaction to anesthesia, Fibromyalgia (2018), GERD (gastroesophageal reflux disease), Hemiplegic migraine, IBS (irritable bowel syndrome), Migraine, MRSA (methicillin resistant staph aureus) culture positive (not sure when), and Vaginal  Pap smear, abnormal. here with increased migraine frequency and severity.  she still has > 10 migraine days a month and > 15 headache days a month. Botox still helps > 50% but still having residual chronic migraine will add on vyepti.  We have tried a plethora of medications. We stopped the propranolol due to a severe attack. She is taking baclofen still. She is on the trokendi  at bedtime still. Sometimes things are very bad and sometimes they get better. She gets a lot of right hand tingling, more falls, intractable headaches despite a plethora of medication and changes in management, headaches positional worse in the morning and nocturnal, also vision changes she is getting blurry vision very blurry in the morning and with the headaches. Discussed getting imaging of the brain. discussed qulipta daily pill, vyepti every 3 months. We will try the vyept start  and then increase to  if tolerated. Marland Kitchen   MRI brain due to concerning symptoms of morning headaches, positional headaches,right hand weakness and numbness, intractable headaches despite a plethora of medications and medication changes, vision changes, worsening headaches  to look for space occupying mass, chiari or intracranial hypertension (pseudotumor), strokes, malignancies, vasculidities, demyelination(multiple sclerosis) or other  Orders Placed This Encounter  Procedures   MR BRAIN W WO CONTRAST    From a current review of records, medications tried in the past that can be used in migraine management include Tylenol, baclofen(did well but impaired her milk when breastfeeding), currently on Botox every 12 weeks, currently on limited Fioricet (limited  supply every month, discussed rebound extensively with patient), Flexeril, Medrol Dosepak, Decadron injections, Benadryl, Aimovig, Prozac, Emgality, on a very limited supply of hydrocodone (again she is given a very limited supply of hydrocodone to use only as absolutely needed),  ibuprofen, seroquel, ketorolac oral and injections, we have prescribed Toradol injections for self administration at home, lasmiditan, Robaxin, Reglan, Zofran, prednisone tablets, Compazine tablets, Phenergan suppository, Seroquel, Nurtec, Zoloft, Topamax, Qudexy,Ubrelvy caused stomach discomfort. Lasmiditan did not work. allergic to celebrex, melatonin, she declines triptans(She had a great response to imitrex in the past but then had side effects which were likely due to the vasococonstrictive effects and possible TIA so contraindicated - also tried maxalt, treximet.), Amitrip, nortrip,  venlafaxine.  Blood pressure medications in the past have been contraindicated due to her very low blood pressure and pulse. She uses caffeine, Tylenol, Advil and Fioricet in that order to treat her acute headaches. In the past she has tried [place] with minimal benefit. Baclofen in the past helped but decreased her milk supply may consider trying again since she is no longer breast feeding. Has not tried Lyrica, Gabapentin(made her dizzy).. She does not want to try cymbalta because her son tried it and became suicidal. Her blood pressure is a lot better and now she is not bradycardic so next could also consider trying propranolol acutely or preventatively - tried propranolol 01/2022, Zevagepant made it worse  Prior Assessment and Plan 01/02/2022:  - we discussed all her extensive medications tried (see list below). We had a long discussion on medication she has not tried, medication she has tried, and we eventually decided to: 1. discontinue her methocarbamol, try baclofen again for her muscle spasms(helped) during the day and for acute management of migraines as needed,  2. try gabapentin at bedtime(made her dizzy) which may help with her migraines and fibromyalgia and also help her sleep,  3. increase her Trokendi to 150 mg(helping stay here) for migraine preventative(she was at 200mg  with side effects and we had decreased  to 100mg , we will increase to 150 and see how she does).   4. Also, her blood pressure is not as low as it used to be and she is often tachycardic in the 80s and 90s, this means propranolol could be something we could try as well, I will see her back in a month and see how above is going. (01/23/2022: Stable, the gabapentin made her dizzy but helped sleep. Started  propranolol at bedtime but watch very carefully for bradycardia and hypotension. If tolerated cn change to 60mg  ER at bedtime.)  - Patient gets a very limited supply of vicodin and fioricet, we have discussed that these are not recommend in migraine management however given her history and all the medication she has tried, we give her a limited amount, she is very knowledgeable and compliant with administration. , this is an extenuating case for migraines and explained I wouldn't do that for many migraine patients but with hemiplegia if this helps and nothing else helps we can provide.  - Also Fioricet, will give her 15 a month but explained do not take more than 5x - 10x a month (she takes 2 pills at onset). This is also NOT something I like to do for the average migraine patient but given her severity of hemiplegia and her trustworthiness will continue. There is evidence this may be making her migraines worse, discussed.    Meds ordered this encounter  Medications   ketorolac (TORADOL) 60 MG/2ML SOLN injection  Sig: INJECT 2 ML INTRAMUSCULARLY EVERY 6 HOURS AS NEEDED FOR  MIGRANE. NO MORE THAN 120 MG ( ) IN  24 HOURS AND NO MORE THAN 4 DAYS PER MONTH.    Dispense:  28 mL    Refill:  11   NEEDLE, DISP, 23 G 23G X 1" MISC    Sig: Attach to syringe to draw up and administer Toradol into the muscle.    Dispense:  4 each    Refill:  11    30 day supply   Syringe, Disposable, (B-D SYRINGE LUER-LOK 3CC) 3 ML MISC    Sig: Attach to needle to draw up and administer Toradol into the muscle.    Dispense:  4 each    Refill:  11    30 day  supply   SYRINGE-NEEDLE, DISP, 3 ML (B-D 3CC LUER-LOK SYR 23GX1") 23G X 1" 3 ML MISC    Sig: Attach to syringe to draw up and administer Toradol into the muscle.    Dispense:  4 each    Refill:  11   TROKENDI XR 100 MG CP24    Sig: Take 100 mg by mouth at bedtime. Take with a  capsule for a total of  at bedtme    Dispense:  90 capsule    Refill:  3    Brand only. Failed generic.   TROKENDI XR 50 MG CP24    Sig: Take 50 mg by mouth at bedtime. Take with a  capsule for a total of  at bedtme.    Dispense:  90 capsule    Refill:  3    Brand only. Failed generic.   HYDROcodone-acetaminophen (NORCO/VICODIN) 5-325 MG tablet    Sig: Take 1 tablet by mouth every 6 (six) hours as needed for moderate pain.    Dispense:  60 tablet    Refill:  0   baclofen (LIORESAL) 10 MG tablet    Sig: Take 1 tablet (10 mg total) by mouth 3 (three) times daily as needed for muscle spasms. Or for migraines.    Dispense:  90 each    Refill:  11    Cancel methocarbamol   butalbital-acetaminophen-caffeine (FIORICET) 50-325-40 MG tablet    Sig: Take 1-2 tablets by mouth every 6 (six) hours as needed for headache.    Dispense:  15 tablet    Refill:  5    Do not fill early, 15 tablets only per month.   prochlorperazine (COMPAZINE) 10 MG tablet    Sig: Take 1 tablet (10 mg total) by mouth every 6 (six) hours as needed.    Dispense:  30 tablet    Refill:  11   promethazine (PHENERGAN) 25 MG suppository    Sig: Place 1 suppository (25 mg total) rectally every 6 (six) hours as needed for nausea or vomiting.    Dispense:  12 each    Refill:  11   ibuprofen (ADVIL) 800 MG tablet    Sig: Take 1 tablet (800 mg total) by mouth 3 (three) times daily with meals as needed for headache, moderate pain or cramping.    Dispense:  90 tablet    Refill:  6   ondansetron (ZOFRAN-ODT) 8 MG disintegrating tablet    Sig: Take 1 tablet (8 mg total) by mouth every 8 (eight) hours as needed for nausea.    Dispense:   30 tablet    Refill:  11    Discussed in the past: There is increased risk for stroke in women  with migraine with aura and a contraindication for the combined contraceptive pill for use by women who have migraine with aura. The risk for women with migraine without aura is lower. However other risk factors like smoking are far more likely to increase stroke risk than migraine. There is a recommendation for no smoking and for the use of OCPs without estrogen such as progestogen only pills particularly for women with migraine with aura.Marland Kitchen People who have migraine headaches with auras may be 3 times more likely to have a stroke caused by a blood clot, compared to migraine patients who don't see auras. Women who take hormone-replacement therapy may be 30 percent more likely to suffer a clot-based stroke than women not taking medication containing estrogen. Other risk factors like smoking and high blood pressure may be  much more important.    Naomie Dean, MD [facility] [address] [place], [place] [address] [phone]

## 2022-08-30 NOTE — Telephone Encounter (Signed)
ok 

## 2022-08-30 NOTE — Telephone Encounter (Signed)
[employer] medicare/[place] medicaid No auth required sent to [employer] [phone]

## 2022-09-21 ENCOUNTER — Encounter: Payer: Self-pay | Admitting: Neurology

## 2022-09-21 ENCOUNTER — Other Ambulatory Visit: Payer: Self-pay | Admitting: Neurology

## 2022-10-03 ENCOUNTER — Other Ambulatory Visit: Payer: Self-pay | Admitting: Family Medicine

## 2022-10-03 DIAGNOSIS — J302 Other seasonal allergic rhinitis: Secondary | ICD-10-CM

## 2022-10-09 ENCOUNTER — Ambulatory Visit (INDEPENDENT_AMBULATORY_CARE_PROVIDER_SITE_OTHER): Payer: Medicare Other | Admitting: Neurology

## 2022-10-09 DIAGNOSIS — G43711 Chronic migraine without aura, intractable, with status migrainosus: Secondary | ICD-10-CM | POA: Diagnosis not present

## 2022-10-09 MED ORDER — ONABOTULINUMTOXINA 200 UNITS IJ SOLR
155.0000 [IU] | Freq: Once | INTRAMUSCULAR | Status: AC
  Administered 2022-10-09: 155 [IU] via INTRAMUSCULAR

## 2022-10-09 NOTE — Progress Notes (Signed)
Consent Form Botulism Toxin Injection For Chronic Migraine    10/09/2022: >50% improvement in migraine freq and severity. Zavagepant approved. Trying to get vyepti approved. She has already tried 2 other providers, she can stay with me for botox.   She gets > 50% improvement of frequency f migraine with botox but stll has residual: she still has > 10 migraine days a month and > 15 headache days a month. Botox still helps > 50% but still having residual chronic migraine will add on vyepti.  We have tried a plethora of medications.  We will try the vyept start 100mg  first injection and then increase to 300mg  if tolerated. She has tried all 3 injectables.    From a current review of records, medications tried in the past that can be used in migraine management include Tylenol, baclofen(did well but impaired her milk when breastfeeding), currently on Botox every 12 weeks, currently on limited Fioricet (limited supply every month, discussed rebound extensively with patient), Flexeril, Medrol Dosepak, Decadron injections, Benadryl, Aimovig, Prozac, Emgality, on a very limited supply of hydrocodone (again she is given a very limited supply of hydrocodone to use only as absolutely needed), ibuprofen, seroquel, ketorolac oral and injections, we have prescribed Toradol injections for self administration at home, lasmiditan, Robaxin, Reglan, Zofran, prednisone tablets, Compazine tablets, Phenergan suppository, Seroquel, Nurtec, Zoloft, Topamax, Qudexy,Ubrelvy caused stomach discomfort. Lasmiditan did not work. allergic to celebrex, melatonin, she declines triptans(She had a great response to imitrex in the past but then had side effects which were likely due to the vasococonstrictive effects and possible TIA so contraindicated - also tried maxalt, treximet.), Amitrip, nortrip,  venlafaxine.  Blood pressure medications in the past have been contraindicated due to her very low blood pressure and pulse. She uses  caffeine, Tylenol, Advil and Fioricet in that order to treat her acute headaches. In the past she has tried with minimal benefit. Baclofen in the past helped but decreased her milk supply may consider trying again since she is no longer breast feeding. Has not tried Lyrica, Gabapentin(made her dizzy).. She does not want to try cymbalta because her son tried it and became suicidal. Her blood pressure is a lot better and now she is not bradycardic so next could also consider trying propranolol acutely or preventatively - tried propranolol 01/2022, Zevagepant made it worse  From 01/02/2022 visit: From a current review of records, medications tried in the past that can be used in migraine management include Tylenol, baclofen(did well but impaired her milk when breastfeeding), currently on Botox every 12 weeks, currently on limited Fioricet (limited supply every month, discussed rebound extensively with patient), Flexeril, Medrol Dosepak, Decadron injections, Benadryl, Aimovig, Prozac, Emgality, on a very limited supply of hydrocodone (again she is given a very limited supply of hydrocodone to use only as absolutely needed), ibuprofen, seroquel, ketorolac oral and injections, we have prescribed Toradol injections for self administration at home, lasmiditan, Robaxin, Reglan, Zofran, prednisone tablets, Compazine tablets, Phenergan suppository, Seroquel, Nurtec, Zoloft, Topamax, Qudexy,Ubrelvy caused stomach discomfort. Lasmiditan did not work. allergic to celebrex, melatonin, she declines triptans(She had a great response to imitrex in the past but then had side effects which were likely due to the vasococonstrictive effects and possible TIA so contraindicated - also tried maxalt, treximet.), Amitrip, nortrip,  venlafaxine.  Blood pressure medications in the past have been contraindicated due to her very low blood pressure and pulse and tried propranolol and had to discontinue due to side effects. She uses  caffeine,  Tylenol, Advil and Fioricet in that order to treat her acute headaches. In the past she has tried [place] with minimal benefit. Baclofen in the past helped but decreased her milk supply may consider trying again since she is no longer breast feeding. Has not tried Lyrica but Gabapentin made her swollen and dizzy. She does not want to try cymbalta because her son tried it and became suicidal. Her blood pressure is a lot better and now she is not bradycardic so next could also consider trying propranolol(tried, had to stop it due to side effects) tried IR and ER propranolol.   01/23/2022: Stable, the gabapentin made her dizzy but helped sleep. We will try propranolol at bedtime but watch very carefully for bradycardia and hypotension. If tolerated cn change to 60mg  ER at bedtime.     10/24/2021: Doing extremely well on botox > 70% improvement in frequency and severity of migraines and headaches per patient today.Ubrelvy caused stomach discomfort. Lasmiditan did not work. Tried Nurtec. Continue Fioricet, only acute med that works. She is not a clencher and she has no pain behind her eyes. We try to position the occipital and cervical injections "around the pony tail area" and at occipitalis more using "follow the pain protocol"     Reviewed orally with patient, additionally signature is on file:Consent Form Botulism Toxin Injection For Chronic Migraine    Reviewed orally with patient, additionally signature is on file:  Botulism toxin has been approved by the Federal drug administration for treatment of chronic migraine. Botulism toxin does not cure chronic migraine and it may not be effective in some patients.  The administration of botulism toxin is accomplished by injecting a small amount of toxin into the muscles of the neck and head. Dosage must be titrated for each individual. Any benefits resulting from botulism toxin tend to wear off after 3 months with a repeat injection required if  benefit is to be maintained. Injections are usually done every 3-4 months with maximum effect peak achieved by about 2 or 3 weeks. Botulism toxin is expensive and you should be sure of what costs you will incur resulting from the injection.  The side effects of botulism toxin use for chronic migraine may include:   -Transient, and usually mild, facial weakness with facial injections  -Transient, and usually mild, head or neck weakness with head/neck injections  -Reduction or loss of forehead facial animation due to forehead muscle weakness  -Eyelid drooping  -Dry eye  -Pain at the site of injection or bruising at the site of injection  -Double vision  -Potential unknown long term risks  Contraindications: You should not have Botox if you are pregnant, nursing, allergic to albumin, have an infection, skin condition, or muscle weakness at the site of the injection, or have myasthenia gravis, Lambert-Eaton syndrome, or ALS.  It is also possible that as with any injection, there may be an allergic reaction or no effect from the medication. Reduced effectiveness after repeated injections is sometimes seen and rarely infection at the injection site may occur. All care will be taken to prevent these side effects. If therapy is given over a long time, atrophy and wasting in the muscle injected may occur. Occasionally the patient's become refractory to treatment because they develop antibodies to the toxin. In this event, therapy needs to be modified.  I have read the above information and consent to the administration of botulism toxin.    BOTOX PROCEDURE NOTE FOR MIGRAINE HEADACHE    Contraindications  and precautions discussed with patient(above). Aseptic procedure was observed and patient tolerated procedure. Procedure performed by Dr. Artemio Aly  The condition has existed for more than 6 months, and pt does not have a diagnosis of ALS, Myasthenia Gravis or Lambert-Eaton Syndrome.  Risks and  benefits of injections discussed and pt agrees to proceed with the procedure.  Written consent obtained  These injections are medically necessary. Pt  receives good benefits from these injections. These injections do not cause sedations or hallucinations which the oral therapies may cause.  Description of procedure:  The patient was placed in a sitting position. The standard protocol was used for Botox as follows, with 5 units of Botox injected at each site:   -Procerus muscle, midline injection  -Corrugator muscle, bilateral injection  -Frontalis muscle, bilateral injection, with 2 sites each side, medial injection was performed in the upper one third of the frontalis muscle, in the region vertical from the medial inferior edge of the superior orbital rim. The lateral injection was again in the upper one third of the forehead vertically above the lateral limbus of the cornea, 1.5 cm lateral to the medial injection site.  -Temporalis muscle injection, 4 sites, bilaterally. The first injection was 3 cm above the tragus of the ear, second injection site was 1.5 cm to 3 cm up from the first injection site in line with the tragus of the ear. The third injection site was 1.5-3 cm forward between the first 2 injection sites. The fourth injection site was 1.5 cm posterior to the second injection site.   -Occipitalis muscle injection, 3 sites, bilaterally. The first injection was done one half way between the occipital protuberance and the tip of the mastoid process behind the ear. The second injection site was done lateral and superior to the first, 1 fingerbreadth from the first injection. The third injection site was 1 fingerbreadth superiorly and medially from the first injection site.  -Cervical paraspinal muscle injection, 2 sites, bilateral knee first injection site was 1 cm from the midline of the cervical spine, 3 cm inferior to the lower border of the occipital protuberance. The second injection  site was 1.5 cm superiorly and laterally to the first injection site.  -Trapezius muscle injection was performed at 3 sites, bilaterally. The first injection site was in the upper trapezius muscle halfway between the inflection point of the neck, and the acromion. The second injection site was one half way between the acromion and the first injection site. The third injection was done between the first injection site and the inflection point of the neck.   Will return for repeat injection in 3 months.   155  units of Botox was used, 45 U Botox not injected was wasted. The patient tolerated the procedure well, there were no complications of the above procedure.

## 2022-10-09 NOTE — Patient Instructions (Signed)
Consent Form Botulism Toxin Injection For Chronic Migraine    07/10/2022: stable on botox. Will try Zevegepant for acute management for migraines. Patient stays with me, she already tried 2 other providers  Meds ordered this encounter  Medications   botulinum toxin Type A (BOTOX) injection 155 Units    Botox- 200 units x 1 vial Lot: [id number] Expiration: 02/2025 NDC: 5409-8119-14  Bacteriostatic 0.9% Sodium Chloride- 36mL total Lot: [id number] Expiration: 07/21/2023 NDC: 2130-8657-84  Dx: O96.295 B/B     04/17/2022: >50% improvement in migraine freq and severity. She is not a clencher and she has no pain behind her eyes. We try to position the occipital and cervical injections "around the pony tail area" and at occipitalis more using "follow the pain protocol". Stopped the propranolol, she had an episode of hemiplegia with migraine had EMS come and resolved. Baclofen is working well. It has been a very long time since she had a severe migraine like that, she can get numbness and hemiplegia, this one happened the 18th of April. She took fioircet and toradol and all of her rescue meds.   when the new nasal spray comes out will try it, triptans contraindicated  From 01/02/2022 visit: From a current review of records, medications tried in the past that can be used in migraine management include Tylenol, baclofen(did well but impaired her milk when breastfeeding), currently on Botox every 12 weeks, currently on limited Fioricet (limited supply every month, discussed rebound extensively with patient), Flexeril, Medrol Dosepak, Decadron injections, Benadryl, Aimovig, Prozac, Emgality, on a very limited supply of hydrocodone (again she is given a very limited supply of hydrocodone to use only as absolutely needed), ibuprofen, seroquel, ketorolac oral and injections, we have prescribed Toradol injections for self administration at home, lasmiditan, Robaxin, Reglan, Zofran, prednisone tablets,  Compazine tablets, Phenergan suppository, Seroquel, Nurtec, Zoloft, Topamax, Qudexy,Ubrelvy caused stomach discomfort. Lasmiditan did not work. allergic to celebrex, melatonin, she declines triptans(She had a great response to imitrex in the past but then had side effects which were likely due to the vasococonstrictive effects and possible TIA so contraindicated - also tried maxalt, treximet.), Amitrip, nortrip,  venlafaxine.  Blood pressure medications in the past have been contraindicated due to her very low blood pressure and pulse and tried propranolol and had to discontinue due to side effects. She uses caffeine, Tylenol, Advil and Fioricet in that order to treat her acute headaches. In the past she has tried [place] with minimal benefit. Baclofen in the past helped but decreased her milk supply may consider trying again since she is no longer breast feeding. Has not tried Lyrica but Gabapentin made her swollen and dizzy. She does not want to try cymbalta because her son tried it and became suicidal. Her blood pressure is a lot better and now she is not bradycardic so next could also consider trying propranolol(tried, had to stop it due to side effects) tried IR and ER propranolol.   01/23/2022: Stable, the gabapentin made her dizzy but helped sleep. We will try propranolol at bedtime but watch very carefully for bradycardia and hypotension. If tolerated cn change to 60mg  ER at bedtime.   Meds ordered this encounter  Medications   botulinum toxin Type A (BOTOX) injection 155 Units    Botox- 200 units x 1 vial Lot: Expiration: 02/2025 NDC: 03/2025  Bacteriostatic 0.9% Sodium Chloride- 46mL total Lot: [id number] Expiration: 07/21/2023 NDC: 09/20/2023  Dx: [id number] B/B    10/24/2021: Doing extremely well on botox > 70%  improvement in frequency and severity of migraines and headaches per patient today.Ubrelvy caused stomach discomfort. Lasmiditan did not work. Tried Nurtec. Continue  Fioricet, only acute med that works. She is not a clencher and she has no pain behind her eyes. We try to position the occipital and cervical injections "around the pony tail area" and at occipitalis more using "follow the pain protocol"     Reviewed orally with patient, additionally signature is on file:Consent Form Botulism Toxin Injection For Chronic Migraine    Reviewed orally with patient, additionally signature is on file:  Botulism toxin has been approved by the Federal drug administration for treatment of chronic migraine. Botulism toxin does not cure chronic migraine and it may not be effective in some patients.  The administration of botulism toxin is accomplished by injecting a small amount of toxin into the muscles of the neck and head. Dosage must be titrated for each individual. Any benefits resulting from botulism toxin tend to wear off after 3 months with a repeat injection required if benefit is to be maintained. Injections are usually done every 3-4 months with maximum effect peak achieved by about 2 or 3 weeks. Botulism toxin is expensive and you should be sure of what costs you will incur resulting from the injection.  The side effects of botulism toxin use for chronic migraine may include:   -Transient, and usually mild, facial weakness with facial injections  -Transient, and usually mild, head or neck weakness with head/neck injections  -Reduction or loss of forehead facial animation due to forehead muscle weakness  -Eyelid drooping  -Dry eye  -Pain at the site of injection or bruising at the site of injection  -Double vision  -Potential unknown long term risks  Contraindications: You should not have Botox if you are pregnant, nursing, allergic to albumin, have an infection, skin condition, or muscle weakness at the site of the injection, or have myasthenia gravis, Lambert-Eaton syndrome, or ALS.  It is also possible that as with any injection, there may be an  allergic reaction or no effect from the medication. Reduced effectiveness after repeated injections is sometimes seen and rarely infection at the injection site may occur. All care will be taken to prevent these side effects. If therapy is given over a long time, atrophy and wasting in the muscle injected may occur. Occasionally the patient's become refractory to treatment because they develop antibodies to the toxin. In this event, therapy needs to be modified.  I have read the above information and consent to the administration of botulism toxin.    BOTOX PROCEDURE NOTE FOR MIGRAINE HEADACHE    Contraindications and precautions discussed with patient(above). Aseptic procedure was observed and patient tolerated procedure. Procedure performed by Dr. Artemio Aly  The condition has existed for more than 6 months, and pt does not have a diagnosis of ALS, Myasthenia Gravis or Lambert-Eaton Syndrome.  Risks and benefits of injections discussed and pt agrees to proceed with the procedure.  Written consent obtained  These injections are medically necessary. Pt  receives good benefits from these injections. These injections do not cause sedations or hallucinations which the oral therapies may cause.  Description of procedure:  The patient was placed in a sitting position. The standard protocol was used for Botox as follows, with 5 units of Botox injected at each site:   -Procerus muscle, midline injection  -Corrugator muscle, bilateral injection  -Frontalis muscle, bilateral injection, with 2 sites each side, medial injection was performed in the  upper one third of the frontalis muscle, in the region vertical from the medial inferior edge of the superior orbital rim. The lateral injection was again in the upper one third of the forehead vertically above the lateral limbus of the cornea, 1.5 cm lateral to the medial injection site.  -Temporalis muscle injection, 4 sites, bilaterally. The first  injection was 3 cm above the tragus of the ear, second injection site was 1.5 cm to 3 cm up from the first injection site in line with the tragus of the ear. The third injection site was 1.5-3 cm forward between the first 2 injection sites. The fourth injection site was 1.5 cm posterior to the second injection site.   -Occipitalis muscle injection, 3 sites, bilaterally. The first injection was done one half way between the occipital protuberance and the tip of the mastoid process behind the ear. The second injection site was done lateral and superior to the first, 1 fingerbreadth from the first injection. The third injection site was 1 fingerbreadth superiorly and medially from the first injection site.  -Cervical paraspinal muscle injection, 2 sites, bilateral knee first injection site was 1 cm from the midline of the cervical spine, 3 cm inferior to the lower border of the occipital protuberance. The second injection site was 1.5 cm superiorly and laterally to the first injection site.  -Trapezius muscle injection was performed at 3 sites, bilaterally. The first injection site was in the upper trapezius muscle halfway between the inflection point of the neck, and the acromion. The second injection site was one half way between the acromion and the first injection site. The third injection was done between the first injection site and the inflection point of the neck.   Will return for repeat injection in 3 months.   155  units of Botox was used, 45 U Botox not injected was wasted. The patient tolerated the procedure well, there were no complications of the above procedure.

## 2022-10-09 NOTE — Progress Notes (Signed)
Botox- 200 units x 1 vial Lot: [id number] Expiration: 02/2025 NDC: 0023-3921-02  Bacteriostatic 0.9% Sodium Chloride- 4mL total Lot: [id number] Expiration: 07/21/2023 NDC: 0409-1966-02  Dx: G43.711 B/B  

## 2022-10-18 ENCOUNTER — Other Ambulatory Visit: Payer: Self-pay | Admitting: Family Medicine

## 2022-10-18 DIAGNOSIS — G43409 Hemiplegic migraine, not intractable, without status migrainosus: Secondary | ICD-10-CM

## 2022-10-19 NOTE — Telephone Encounter (Signed)
Scheduled 11/26/2022 @ 3:40. tvt

## 2022-10-19 NOTE — Telephone Encounter (Signed)
Patient needs a follow up appointment for further refills on Norethindrone (MICRONOR) 0.35 MG tablet.  Last office visit 06/13/2022  NO Show Appointment 07/31/2022  Last filled 08/11/2021  Sent 84 tablets to the pharmacy. No refills.

## 2022-10-29 ENCOUNTER — Other Ambulatory Visit: Payer: Self-pay | Admitting: Neurology

## 2022-10-30 ENCOUNTER — Other Ambulatory Visit: Payer: Self-pay | Admitting: Family Medicine

## 2022-11-05 ENCOUNTER — Other Ambulatory Visit: Payer: Self-pay | Admitting: *Deleted

## 2022-11-05 NOTE — Progress Notes (Signed)
error 

## 2022-11-14 ENCOUNTER — Other Ambulatory Visit: Payer: Self-pay | Admitting: Medical-Surgical

## 2022-11-26 ENCOUNTER — Encounter: Payer: Medicare Other | Admitting: Medical-Surgical

## 2022-11-27 NOTE — Progress Notes (Signed)
Erroneous encounter

## 2022-12-05 ENCOUNTER — Telehealth: Payer: Self-pay | Admitting: Neurology

## 2022-12-05 NOTE — Telephone Encounter (Signed)
Pt is on the schedule for botox injection for 01/01/23 and will need a new PA before appointment. Prior PA expired 10/24/22.

## 2022-12-05 NOTE — Telephone Encounter (Signed)
Chronic Migraine CPT 64615  Botox J0585 Units:200  G43.711 Chronic Migraine without aura, intractable, with status migrainous  

## 2022-12-05 NOTE — Telephone Encounter (Signed)
Pt called back. Stated nothing has changed with her insurance.

## 2022-12-05 NOTE — Telephone Encounter (Signed)
Please call patient and see if there has been any insurance change for 2024. If so, can they send a picture of front and back of card through mychart? If they cannot, please gather pt's insurance name, ID, Group #, and if there are Prescription benefits then we need the Rx ID, BIN, PCN, group numbers, and the provider pre-certification number from the back of the card. Thank you!  

## 2022-12-07 ENCOUNTER — Ambulatory Visit (HOSPITAL_COMMUNITY)
Admission: RE | Admit: 2022-12-07 | Discharge: 2022-12-07 | Disposition: A | Discharge status: Home / Self Care | Payer: 59 | Source: Ambulatory Visit | Attending: Neurology | Admitting: Neurology

## 2022-12-07 DIAGNOSIS — W19XXXA Unspecified fall, initial encounter: Secondary | ICD-10-CM | POA: Diagnosis not present

## 2022-12-07 DIAGNOSIS — R202 Paresthesia of skin: Secondary | ICD-10-CM | POA: Diagnosis not present

## 2022-12-07 DIAGNOSIS — R531 Weakness: Secondary | ICD-10-CM | POA: Diagnosis not present

## 2022-12-07 DIAGNOSIS — R29898 Other symptoms and signs involving the musculoskeletal system: Secondary | ICD-10-CM

## 2022-12-07 DIAGNOSIS — R51 Headache with orthostatic component, not elsewhere classified: Secondary | ICD-10-CM | POA: Diagnosis present

## 2022-12-07 DIAGNOSIS — G96810 Intracranial hypotension, unspecified: Secondary | ICD-10-CM | POA: Diagnosis not present

## 2022-12-07 DIAGNOSIS — R519 Headache, unspecified: Secondary | ICD-10-CM

## 2022-12-07 DIAGNOSIS — H539 Unspecified visual disturbance: Secondary | ICD-10-CM

## 2022-12-07 DIAGNOSIS — R200 Anesthesia of skin: Secondary | ICD-10-CM | POA: Diagnosis present

## 2022-12-07 MED ORDER — GADOBUTROL 1 MMOL/ML IV SOLN
8.0000 mL | Freq: Once | INTRAVENOUS | Status: AC | PRN
  Administered 2022-12-07: 8 mL via INTRAVENOUS

## 2022-12-17 NOTE — Telephone Encounter (Signed)
Any updates on PA? Pt is scheduled for injection on 01/01/23 

## 2022-12-18 ENCOUNTER — Ambulatory Visit: Payer: Medicare Other | Admitting: Medical-Surgical

## 2022-12-21 ENCOUNTER — Other Ambulatory Visit (HOSPITAL_COMMUNITY): Payer: Self-pay

## 2022-12-21 NOTE — Telephone Encounter (Signed)
Pharmacy Patient Advocate Encounter   Received notification from [facility] that prior authorization for Botox 200 units is required/requested.    PA submitted on 12/21/2022 to (ins) [employer] via CoverMyMeds [place] Status is pending  Also checking on coverage under medical benefits

## 2022-12-21 NOTE — Telephone Encounter (Signed)
Benefit Verification [id number] Submitted!

## 2022-12-24 ENCOUNTER — Other Ambulatory Visit (HOSPITAL_COMMUNITY): Payer: Self-pay

## 2022-12-24 ENCOUNTER — Other Ambulatory Visit: Payer: Self-pay

## 2022-12-24 ENCOUNTER — Encounter: Payer: Self-pay | Admitting: Medical-Surgical

## 2022-12-24 ENCOUNTER — Ambulatory Visit (INDEPENDENT_AMBULATORY_CARE_PROVIDER_SITE_OTHER): Payer: 59 | Admitting: Medical-Surgical

## 2022-12-24 VITALS — BP 112/80 | HR 93 | Resp 20 | Ht 67.0 in | Wt 180.9 lb

## 2022-12-24 DIAGNOSIS — Z8249 Family history of ischemic heart disease and other diseases of the circulatory system: Secondary | ICD-10-CM | POA: Diagnosis not present

## 2022-12-24 DIAGNOSIS — R1013 Epigastric pain: Secondary | ICD-10-CM

## 2022-12-24 DIAGNOSIS — N921 Excessive and frequent menstruation with irregular cycle: Secondary | ICD-10-CM

## 2022-12-24 DIAGNOSIS — G43909 Migraine, unspecified, not intractable, without status migrainosus: Secondary | ICD-10-CM | POA: Insufficient documentation

## 2022-12-24 DIAGNOSIS — F339 Major depressive disorder, recurrent, unspecified: Secondary | ICD-10-CM

## 2022-12-24 DIAGNOSIS — K589 Irritable bowel syndrome without diarrhea: Secondary | ICD-10-CM | POA: Insufficient documentation

## 2022-12-24 DIAGNOSIS — F5101 Primary insomnia: Secondary | ICD-10-CM | POA: Insufficient documentation

## 2022-12-24 DIAGNOSIS — G43409 Hemiplegic migraine, not intractable, without status migrainosus: Secondary | ICD-10-CM | POA: Diagnosis not present

## 2022-12-24 MED ORDER — FLUOXETINE HCL 10 MG PO CAPS: 10.0000 mg | ORAL_CAPSULE | Freq: Every day | ORAL | 3 refills | Status: DC

## 2022-12-24 MED ORDER — FLUOXETINE HCL 20 MG PO CAPS: 20.0000 mg | ORAL_CAPSULE | Freq: Every day | ORAL | 3 refills | Status: DC

## 2022-12-24 MED ORDER — ONABOTULINUMTOXINA 200 UNITS IJ SOLR
INTRAMUSCULAR | 3 refills | Status: DC
  Filled 2022-12-24: qty 1, 84d supply, fill #0
  Filled 2022-12-24: qty 1, fill #0
  Filled 2023-03-18: qty 1, 84d supply, fill #1
  Filled 2023-06-06: qty 1, 84d supply, fill #2
  Filled 2023-09-18: qty 1, 84d supply, fill #3

## 2022-12-24 MED ORDER — NORETHINDRONE 0.35 MG PO TABS: 1.0000 | ORAL_TABLET | Freq: Every day | ORAL | 3 refills | Status: DC

## 2022-12-24 NOTE — Telephone Encounter (Signed)
Botox Rx sent to [facility] outpatient pharmacy.  

## 2022-12-24 NOTE — Telephone Encounter (Signed)
Pharmacy Patient Advocate Encounter  Prior Authorization for Botox 200UNIT solution has been approved.    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

## 2022-12-24 NOTE — Addendum Note (Signed)
Addended by: Gildardo Griffes on: 12/24/2022 11:52 AM   Modules accepted: Orders

## 2022-12-24 NOTE — Progress Notes (Signed)
Established Patient Office Visit  Subjective   Patient ID: Holly [name], female   DOB: 07/09/1976 Age: 47 y.o. MRN: [id number]   Chief Complaint  Patient presents with   Follow-up   Medication Refill    HPI Very pleasant 47 year old female presenting today for the following:  Migraines: Is in the care of neurology and has multiple medications used to help manage migraines.  Reports that she continues to have approximately 4 migraines per week and they are working to get her approved for infusions.  GERD: Taking Prilosec 40 mg daily most days.  Not having issues with residual symptoms and has no concerns for side effects.  Mood: Taking fluoxetine 30 mg daily although she admits that she sometimes takes 40 mg daily if she has flares of her depressive symptoms.  She had discussed this previously with Dr. Sheppard Coil and this regimen was agreed upon.  Denies SI/HI.  Birth control: Taking Micronor 1 tablet daily as prescribed, tolerating well without side effects.  Takes this to control menorrhagia but also to help with hormonal triggers for migraines.  Has been on this long-term and is happy with this regimen.  Is requesting testing for Brugada syndrome.  She has multiple family members that have tested positive including 2 who died at very young ages that were affected by the syndrome.   Objective:    Vitals:   12/24/22 1052  BP: 112/80  Pulse: 93  Resp: 20  Height: 5\' 7"  (1.702 m)  Weight: 180 lb 14.4 oz (82.1 kg)  SpO2: 99%  BMI (Calculated): 28.33    Physical Exam Vitals reviewed.  Constitutional:      General: She is not in acute distress.    Appearance: Normal appearance. She is not ill-appearing.  HENT:     Head: Normocephalic and atraumatic.  Cardiovascular:     Rate and Rhythm: Normal rate and regular rhythm.     Pulses: Normal pulses.     Heart sounds: Normal heart sounds.  Pulmonary:     Effort: Pulmonary effort is normal. No respiratory distress.      Breath sounds: Normal breath sounds. No wheezing, rhonchi or rales.  Skin:    General: Skin is warm and dry.  Neurological:     Mental Status: She is alert and oriented to person, place, and time.  Psychiatric:        Mood and Affect: Mood normal.        Behavior: Behavior normal.        Thought Content: Thought content normal.        Judgment: Judgment normal.   No results found for this or any previous visit (from the past 24 hour(s)).     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 - norethindrone (MICRONOR) 0.35 MG tablet; Take 1 tablet (0.35 mg total) by mouth daily.  Dispense: 84 tablet; Refill: 3  2. Family  history of Brugada syndrome Discussed family history and likelihood of being affected.  Since she has several family members who are affected, we will go ahead and draw the arrhythmia panel today.  Advised that this may not be covered by insurance.   - Familial Arrhythmia Panel  3. Depression, recurrent (HCC) Continue fluoxetine 30 mg daily.  Okay to continue previous regimen with brief increases to 40 mg daily on an as needed basis. - FLUoxetine (PROZAC) 10 MG capsule; Take 1 capsule (10 mg total) by mouth daily. Take with 20 mg capsules for total daily dose 30 mg  Dispense: 90 capsule; Refill: 3 - FLUoxetine (PROZAC) 20 MG capsule; Take 1 capsule (20 mg total) by mouth daily.  Dispense: 90 capsule; Refill: 3  4. Menometrorrhagia Continue Micronor as discussed. - norethindrone (MICRONOR) 0.35 MG tablet; Take 1 tablet (0.35 mg total) by  mouth daily.  Dispense: 84 tablet; Refill: 3  5.  Epigastric discomfort/GERD Continue omeprazole 40 mg daily as needed.  Return in about 6 months (around 06/24/2023) for chronic disease follow up; Medicare Wellness visit at your earliest convenience.  ___________________________________________ Clearnce Sorrel, DNP, APRN, FNP-BC [facility] [place]

## 2022-12-25 ENCOUNTER — Encounter: Payer: Self-pay | Admitting: Neurology

## 2022-12-26 ENCOUNTER — Encounter: Payer: Self-pay | Admitting: Medical-Surgical

## 2022-12-26 ENCOUNTER — Other Ambulatory Visit: Payer: Self-pay

## 2022-12-27 ENCOUNTER — Other Ambulatory Visit (HOSPITAL_COMMUNITY): Payer: Self-pay

## 2023-01-01 ENCOUNTER — Ambulatory Visit (INDEPENDENT_AMBULATORY_CARE_PROVIDER_SITE_OTHER): Payer: 59 | Admitting: Neurology

## 2023-01-01 DIAGNOSIS — G43711 Chronic migraine without aura, intractable, with status migrainosus: Secondary | ICD-10-CM | POA: Diagnosis not present

## 2023-01-01 MED ORDER — ONABOTULINUMTOXINA 200 UNITS IJ SOLR
155.0000 [IU] | Freq: Once | INTRAMUSCULAR | Status: AC
  Administered 2023-01-01: 155 [IU] via INTRAMUSCULAR

## 2023-01-01 MED ORDER — METHYLPREDNISOLONE 4 MG PO TABS: ORAL_TABLET | ORAL | 2 refills | Status: DC

## 2023-01-01 MED ORDER — QULIPTA 60 MG PO TABS: 60.0000 mg | ORAL_TABLET | Freq: Every day | ORAL | 11 refills | Status: DC

## 2023-01-01 MED ORDER — KETOROLAC TROMETHAMINE 60 MG/2ML IM SOLN: INTRAMUSCULAR | 11 refills | Status: DC

## 2023-01-01 MED ORDER — HYDROCODONE-ACETAMINOPHEN 5-325 MG PO TABS: 1.0000 | ORAL_TABLET | Freq: Four times a day (QID) | ORAL | 0 refills | Status: DC | PRN

## 2023-01-01 MED ORDER — "BD LUER-LOK SYRINGE 23G X 1"" 3 ML MISC": 11 refills | Status: DC

## 2023-01-01 NOTE — Progress Notes (Signed)
Botox consent signed  Botox- 200 units x 1 vial Lot: [id number] Expiration: 04/2025 NDC: [id number]  Bacteriostatic 0.9% Sodium Chloride- 69m total Lot: [id number]: 09/2024 NDC: [id number] Dx: [id number]

## 2023-01-01 NOTE — Progress Notes (Signed)
Consent Form Botulism Toxin Injection For Chronic Migraine  01/01/2023: stable refilled meds trying qulipta then vyepti  Meds ordered this encounter  Medications   botulinum toxin Type A (BOTOX) injection 155 Units    Botox- 200 units x 1 vial Lot: [id number] Expiration: 04/2025 NDC: [id number]  Bacteriostatic 0.9% Sodium Chloride- 34m total Lot: [id number]: 09/2024 NDC: [id number] Dx: [id number]   HYDROcodone-acetaminophen (NORCO/VICODIN) 5-325 MG tablet    Sig: Take 1 tablet by mouth every 6 (six) hours as needed for moderate pain.    Dispense:  60 tablet    Refill:  0   methylPREDNISolone (MEDROL) 4 MG tablet    Sig: Take 1-3 tablets daily as needed for severe headache intractable to other means    Dispense:  30 tablet    Refill:  2   ketorolac (TORADOL) 60 MG/2ML SOLN injection    Sig: INJECT 2 ML INTRAMUSCULARLY EVERY 6 HOURS AS NEEDED FOR  MIGRANE. NO MORE THAN 120 MG (4ML) IN  24 HOURS AND NO MORE THAN 4 DAYS PER MONTH.    Dispense:  28 mL    Refill:  11   SYRINGE-NEEDLE, DISP, 3 ML (B-D 3CC LUER-LOK SYR 23GX1") 23G X 1" 3 ML MISC    Sig: Attach to syringe to draw up and administer Toradol into the muscle.    Dispense:  4 each    Refill:  11   Atogepant (QULIPTA) 60 MG TABS    Sig: Take 1 tablet (60 mg total) by mouth daily.    Dispense:  30 tablet    Refill:  11    > 10 migraine days a month and > 15 headache days a month tried topiramate, propranolol, amitriptyline, nurtec, ubrelvy, aimovig, emgality,ajovy, imitrex,maxalt     10/09/2022: >50% improvement in migraine freq and severity. Zavagepant approved. Trying to get vyepti approved. She has already tried 2 other providers, she can stay with me for botox.   She gets > 50% improvement of frequency f migraine with botox but stll has residual: she still has > 10 migraine days a month and > 15 headache days a month. Botox still helps > 50% but still having residual chronic migraine will add on  vyepti.  We have tried a plethora of medications.  We will try the vyept start 107mfirst injection and then increase to 30055mf tolerated. She has tried all 3 injectables.    From a current review of records, medications tried in the past that can be used in migraine management include Tylenol, baclofen(did well but impaired her milk when breastfeeding), currently on Botox every 12 weeks, currently on limited Fioricet (limited supply every month, discussed rebound extensively with patient), Flexeril, Medrol Dosepak, Decadron injections, Benadryl, Aimovig, Prozac, Emgality, on a very limited supply of hydrocodone (again she is given a very limited supply of hydrocodone to use only as absolutely needed), ibuprofen, seroquel, ketorolac oral and injections, we have prescribed Toradol injections for self administration at home, lasmiditan, Robaxin, Reglan, Zofran, prednisone tablets, Compazine tablets, Phenergan suppository, Seroquel, Nurtec, Zoloft, Topamax, Qudexy,Ubrelvy caused stomach discomfort. Lasmiditan did not work. allergic to celebrex, melatonin, she declines triptans(She had a great response to imitrex in the past but then had side effects which were likely due to the vasococonstrictive effects and possible TIA so contraindicated - also tried maxalt, treximet.), Amitrip, nortrip,  venlafaxine.  Blood pressure medications in the past have been contraindicated due to her very low blood pressure and pulse. She uses caffeine, Tylenol,  Advil and Fioricet in that order to treat her acute headaches. In the past she has tried [place] with minimal benefit. Baclofen in the past helped but decreased her milk supply may consider trying again since she is no longer breast feeding. Has not tried Lyrica, Gabapentin(made her dizzy).. She does not want to try cymbalta because her son tried it and became suicidal. Her blood pressure is a lot better and now she is not bradycardic so next could also consider trying  propranolol acutely or preventatively - tried propranolol 01/2022, Zevagepant made it worse  From 01/02/2022 visit: From a current review of records, medications tried in the past that can be used in migraine management include Tylenol, baclofen(did well but impaired her milk when breastfeeding), currently on Botox every 12 weeks, currently on limited Fioricet (limited supply every month, discussed rebound extensively with patient), Flexeril, Medrol Dosepak, Decadron injections, Benadryl, Aimovig, Prozac, Emgality, on a very limited supply of hydrocodone (again she is given a very limited supply of hydrocodone to use only as absolutely needed), ibuprofen, seroquel, ketorolac oral and injections, we have prescribed Toradol injections for self administration at home, lasmiditan, Robaxin, Reglan, Zofran, prednisone tablets, Compazine tablets, Phenergan suppository, Seroquel, Nurtec, Zoloft, Topamax, Qudexy,Ubrelvy caused stomach discomfort. Lasmiditan did not work. allergic to celebrex, melatonin, she declines triptans(She had a great response to imitrex in the past but then had side effects which were likely due to the vasococonstrictive effects and possible TIA so contraindicated - also tried maxalt, treximet.), Amitrip, nortrip,  venlafaxine.  Blood pressure medications in the past have been contraindicated due to her very low blood pressure and pulse and tried propranolol and had to discontinue due to side effects. She uses caffeine, Tylenol, Advil and Fioricet in that order to treat her acute headaches. In the past she has tried [place] with minimal benefit. Baclofen in the past helped but decreased her milk supply may consider trying again since she is no longer breast feeding. Has not tried Lyrica but Gabapentin made her swollen and dizzy. She does not want to try cymbalta because her son tried it and became suicidal. Her blood pressure is a lot better and now she is not bradycardic so next could also consider  trying propranolol(tried, had to stop it due to side effects) tried IR and ER propranolol.   01/23/2022: Stable, the gabapentin made her dizzy but helped sleep. We will try propranolol at bedtime but watch very carefully for bradycardia and hypotension. If tolerated cn change to 2m ER at bedtime.     10/24/2021: Doing extremely well on botox > 70% improvement in frequency and severity of migraines and headaches per patient today.Ubrelvy caused stomach discomfort. Lasmiditan did not work. Tried Nurtec. Continue Fioricet, only acute med that works. She is not a clencher and she has no pain behind her eyes. We try to position the occipital and cervical injections "around the pony tail area" and at occipitalis more using "follow the pain protocol"     Reviewed orally with patient, additionally signature is on file:Consent Form Botulism Toxin Injection For Chronic Migraine    Reviewed orally with patient, additionally signature is on file:  Botulism toxin has been approved by the Federal drug administration for treatment of chronic migraine. Botulism toxin does not cure chronic migraine and it may not be effective in some patients.  The administration of botulism toxin is accomplished by injecting a small amount of toxin into the muscles of the neck and head. Dosage must be titrated for each  individual. Any benefits resulting from botulism toxin tend to wear off after 3 months with a repeat injection required if benefit is to be maintained. Injections are usually done every 3-4 months with maximum effect peak achieved by about 2 or 3 weeks. Botulism toxin is expensive and you should be sure of what costs you will incur resulting from the injection.  The side effects of botulism toxin use for chronic migraine may include:   -Transient, and usually mild, facial weakness with facial injections  -Transient, and usually mild, head or neck weakness with head/neck injections  -Reduction or loss of  forehead facial animation due to forehead muscle weakness  -Eyelid drooping  -Dry eye  -Pain at the site of injection or bruising at the site of injection  -Double vision  -Potential unknown long term risks  Contraindications: You should not have Botox if you are pregnant, nursing, allergic to albumin, have an infection, skin condition, or muscle weakness at the site of the injection, or have myasthenia gravis, Lambert-Eaton syndrome, or ALS.  It is also possible that as with any injection, there may be an allergic reaction or no effect from the medication. Reduced effectiveness after repeated injections is sometimes seen and rarely infection at the injection site may occur. All care will be taken to prevent these side effects. If therapy is given over a long time, atrophy and wasting in the muscle injected may occur. Occasionally the patient's become refractory to treatment because they develop antibodies to the toxin. In this event, therapy needs to be modified.  I have read the above information and consent to the administration of botulism toxin.    BOTOX PROCEDURE NOTE FOR MIGRAINE HEADACHE    Contraindications and precautions discussed with patient(above). Aseptic procedure was observed and patient tolerated procedure. Procedure performed by Dr. Georgia Dom  The condition has existed for more than 6 months, and pt does not have a diagnosis of ALS, Myasthenia Gravis or Lambert-Eaton Syndrome.  Risks and benefits of injections discussed and pt agrees to proceed with the procedure.  Written consent obtained  These injections are medically necessary. Pt  receives good benefits from these injections. These injections do not cause sedations or hallucinations which the oral therapies may cause.  Description of procedure:  The patient was placed in a sitting position. The standard protocol was used for Botox as follows, with 5 units of Botox injected at each site:   -Procerus muscle,  midline injection  -Corrugator muscle, bilateral injection  -Frontalis muscle, bilateral injection, with 2 sites each side, medial injection was performed in the upper one third of the frontalis muscle, in the region vertical from the medial inferior edge of the superior orbital rim. The lateral injection was again in the upper one third of the forehead vertically above the lateral limbus of the cornea, 1.5 cm lateral to the medial injection site.  -Temporalis muscle injection, 4 sites, bilaterally. The first injection was 3 cm above the tragus of the ear, second injection site was 1.5 cm to 3 cm up from the first injection site in line with the tragus of the ear. The third injection site was 1.5-3 cm forward between the first 2 injection sites. The fourth injection site was 1.5 cm posterior to the second injection site.   -Occipitalis muscle injection, 3 sites, bilaterally. The first injection was done one half way between the occipital protuberance and the tip of the mastoid process behind the ear. The second injection site was done lateral and  superior to the first, 1 fingerbreadth from the first injection. The third injection site was 1 fingerbreadth superiorly and medially from the first injection site.  -Cervical paraspinal muscle injection, 2 sites, bilateral knee first injection site was 1 cm from the midline of the cervical spine, 3 cm inferior to the lower border of the occipital protuberance. The second injection site was 1.5 cm superiorly and laterally to the first injection site.  -Trapezius muscle injection was performed at 3 sites, bilaterally. The first injection site was in the upper trapezius muscle halfway between the inflection point of the neck, and the acromion. The second injection site was one half way between the acromion and the first injection site. The third injection was done between the first injection site and the inflection point of the neck.   Will return for repeat  injection in 3 months.   155  units of Botox was used, 45 U Botox not injected was wasted. The patient tolerated the procedure well, there were no complications of the above procedure.

## 2023-01-08 ENCOUNTER — Telehealth: Payer: Self-pay | Admitting: Medical-Surgical

## 2023-01-08 NOTE — Telephone Encounter (Signed)
ERRONEOUS ENCOUNTER

## 2023-01-15 ENCOUNTER — Telehealth: Payer: Self-pay | Admitting: Neurology

## 2023-01-15 NOTE — Telephone Encounter (Signed)
I called [name] only got 01-25-2022 29m. The pt was asking for 90 day supply, they gave her 574m  I called pt and she is aware of how to take it. Using as prescribed.  It may have come to you due to pt asking for 90 day supply.  Pt wanted to have supply if when she needs it she will have it.

## 2023-01-15 NOTE — Telephone Encounter (Signed)
I received a letter notifying me of the concern of overuse can you call and find out:  "Case Number: [id number]   Potential Clinical Concern: Overutilization: Ketorolac - Consecutive days supply > 5 days    NOTES The total combined duration of oral, parenteral, or intranasal ketorolac should not exceed 5 days. The use beyond 5 days increases the risk of serious adverse effects such as peptic ulcer and gastrointestinal bleeding/perforation. Please consider other alternatives.      The following table shows the pharmacy claims related to the potential clinical concern identified above:   NOTES Drug Name [place] Date Qty Days Supply Prescriber Name Prescriber Phone # Pharmacy Name Pharmacy Phone #  KETOROLAC INJ '60MG'$ /2ML 01/01/2023 [facility] [phone]  "

## 2023-01-15 NOTE — Telephone Encounter (Signed)
I called [facility] filled oct 12,2023 for 67m, then again in February 13,2024 for 564m(then since back ordered script went [facility].

## 2023-01-17 ENCOUNTER — Telehealth: Payer: Self-pay | Admitting: *Deleted

## 2023-01-17 ENCOUNTER — Other Ambulatory Visit (HOSPITAL_COMMUNITY): Payer: Self-pay

## 2023-01-17 NOTE — Telephone Encounter (Signed)
This PA for qulipta is needed for patient.

## 2023-01-17 NOTE — Telephone Encounter (Signed)
Pharmacy Patient Advocate Encounter   Received notification from [place] that prior authorization for Qulipta '60MG'$  tablets is required/requested.   PA submitted on 01/17/2023 to (ins) [employer] via [employer] or [facility]) confirmation # [id number] Status is pending

## 2023-01-17 NOTE — Telephone Encounter (Signed)
Pharmacy Patient Advocate Encounter  Prior Authorization for Qulipta '60MG'$  tablets has been approved by [employer] (ins).    PA # PA Case ID: [id number] Effective dates: 01/17/2023 through 11/19/2023

## 2023-02-26 ENCOUNTER — Telehealth: Payer: Self-pay | Admitting: General Practice

## 2023-02-26 NOTE — Telephone Encounter (Signed)
Patient was scheduled for medicare wellness visit via telephone for today (02/26/23) at 11 am. Attempted to call patient x 3 and left 3 voicemail's. Unable to get in touch with patient. Patient advised of no-show policy on voicemail.

## 2023-02-28 ENCOUNTER — Telehealth: Payer: Self-pay | Admitting: Neurology

## 2023-02-28 NOTE — Telephone Encounter (Signed)
New Botox auth needed before 03/26/23 appt, current auth expires 03/21/23.

## 2023-03-01 ENCOUNTER — Other Ambulatory Visit (HOSPITAL_COMMUNITY): Payer: Self-pay

## 2023-03-01 NOTE — Telephone Encounter (Signed)
Buy and Bill:   

## 2023-03-01 NOTE — Telephone Encounter (Signed)
Benefit Verification [id number] Submitted!  Botox One

## 2023-03-07 NOTE — Progress Notes (Signed)
Last Mammogram: 04/21/20- normal Last Pap Smear:  01/19/21--neg (s/p CKC 2021) Last Colon Screening;  never done Seat Belts:   yes Sun Screen:   yes  Subjective:     Holly [name] is a 47 y.o. female here for a routine exam.  Current complaints: feels like she has a yeast infection--has been on antibiotics for dental infection (getting dentures).  Has spotting about every 21 days on mini pill.    Gynecologic History No LMP recorded. Contraception: oral progesterone-only contraceptive Last Mammogram: 04/21/20- normal Last Pap Smear:  01/19/21--neg (s/p CKC 2021) Last Colon Screening;  never done Seat Belts:   yes Sun Screen:   yes  Obstetric History OB History  Gravida Para Term Preterm AB Living  SAB IAB Ectopic Multiple Live Births        0 6    # Outcome Date GA Lbr Len/2nd Weight Sex Delivery Anes PTL Lv  6 Preterm 07/07/15 [redacted]w[redacted]d 62:46 2.88 kg F Vag-Spont None  LIV  5 Term 02/2012 [redacted]w[redacted]d  3.204 kg M Vag-Spont EPI  LIV  4 Term 09/2008 [redacted]w[redacted]d  3.43 kg F Vag-Spont Local  LIV  3 Preterm 2006 [redacted]w[redacted]d  3.459 kg F Vag-Spont None  LIV  2 Preterm 2004 [redacted]w[redacted]d  3.033 kg M Vag-Spont   LIV  1 Preterm 2001 [redacted]w[redacted]d  2.807 kg M Vag-Spont   LIV     The following portions of the patient's history were reviewed and updated as appropriate: allergies, current medications, past family history, past medical history, past social history, past surgical history, and problem list.  Review of Systems Pertinent items noted in HPI and remainder of comprehensive ROS otherwise negative.    Objective:     Vitals:   03/11/23 1038  BP: 129/86  Pulse: 95  Weight: 82.1 kg  Height:  (1.702 m)   Vitals:  WNL General appearance: alert, cooperative and no distress  HEENT: Normocephalic, without obvious abnormality, atraumatic Eyes: negative Throat: lips, mucosa, and tongue normal; teeth and gums normal  Respiratory: Clear to auscultation bilaterally  CV: Regular rate and rhythm  Breasts:   Normal appearance, no masses or tenderness, no nipple retraction or dimpling  GI: Soft, non-tender; bowel sounds normal; no masses,  no organomegaly  GU: External Genitalia:  Tanner V, no lesion Urethra:  No prolapse   Vagina: Pink, normal rugae, no blood or discharge  Cervix: No CMT, no lesion (s/p CKC)  Uterus:  Normal size and contour, non tender  Adnexa: Normal, no masses, non tender  Musculoskeletal: No edema, redness or tenderness in the calves or thighs  Skin: No lesions or rash  Lymphatic: Axillary adenopathy: none     Psychiatric: Normal mood and behavior        Assessment:    Healthy female exam.    Plan:    Pap with cotesting Yearly mammograms Colon screening at 47 Cont POP for migraines.  Aptima for yeast symptoms. Continue prog only pill for migraine prophylaxis Getting tested for [place] syndrome (2 relatives have it)

## 2023-03-11 ENCOUNTER — Encounter: Payer: Self-pay | Admitting: Obstetrics & Gynecology

## 2023-03-11 ENCOUNTER — Ambulatory Visit (INDEPENDENT_AMBULATORY_CARE_PROVIDER_SITE_OTHER): Payer: 59 | Admitting: Obstetrics & Gynecology

## 2023-03-11 ENCOUNTER — Other Ambulatory Visit (HOSPITAL_COMMUNITY)
Admission: RE | Admit: 2023-03-11 | Discharge: 2023-03-11 | Disposition: A | Discharge status: Home / Self Care | Payer: 59 | Source: Ambulatory Visit | Attending: Obstetrics & Gynecology | Admitting: Obstetrics & Gynecology

## 2023-03-11 VITALS — BP 129/86 | HR 95 | Ht 67.0 in | Wt 181.0 lb

## 2023-03-11 DIAGNOSIS — Z01419 Encounter for gynecological examination (general) (routine) without abnormal findings: Secondary | ICD-10-CM | POA: Diagnosis present

## 2023-03-11 DIAGNOSIS — Z1151 Encounter for screening for human papillomavirus (HPV): Secondary | ICD-10-CM | POA: Insufficient documentation

## 2023-03-11 DIAGNOSIS — N898 Other specified noninflammatory disorders of vagina: Secondary | ICD-10-CM

## 2023-03-12 LAB — CERVICOVAGINAL ANCILLARY ONLY
Bacterial Vaginitis (gardnerella): NEGATIVE
Candida Glabrata: NEGATIVE
Candida Vaginitis: POSITIVE — AB
Comment: NEGATIVE
Comment: NEGATIVE
Comment: NEGATIVE

## 2023-03-13 ENCOUNTER — Other Ambulatory Visit: Payer: Self-pay | Admitting: Obstetrics & Gynecology

## 2023-03-14 ENCOUNTER — Other Ambulatory Visit: Payer: Self-pay | Admitting: Obstetrics & Gynecology

## 2023-03-14 ENCOUNTER — Other Ambulatory Visit: Payer: Self-pay

## 2023-03-14 DIAGNOSIS — B379 Candidiasis, unspecified: Secondary | ICD-10-CM

## 2023-03-14 MED ORDER — FLUCONAZOLE 150 MG PO TABS: 150.0000 mg | ORAL_TABLET | Freq: Once | ORAL | 0 refills | Status: AC

## 2023-03-15 ENCOUNTER — Other Ambulatory Visit (HOSPITAL_COMMUNITY): Payer: Self-pay

## 2023-03-15 LAB — CYTOLOGY - PAP
Comment: NEGATIVE
Diagnosis: NEGATIVE
High risk HPV: NEGATIVE

## 2023-03-18 ENCOUNTER — Other Ambulatory Visit (HOSPITAL_COMMUNITY): Payer: Self-pay

## 2023-03-18 ENCOUNTER — Other Ambulatory Visit: Payer: Self-pay

## 2023-03-20 ENCOUNTER — Ambulatory Visit (INDEPENDENT_AMBULATORY_CARE_PROVIDER_SITE_OTHER): Payer: 59 | Admitting: Medical-Surgical

## 2023-03-20 DIAGNOSIS — Z0000 Encounter for general adult medical examination without abnormal findings: Secondary | ICD-10-CM | POA: Diagnosis not present

## 2023-03-20 NOTE — Patient Instructions (Addendum)
MEDICARE ANNUAL WELLNESS VISIT Health Maintenance Summary and Written Plan of Care  Holly [name] ,  Thank you for allowing me to perform your Medicare Annual Wellness Visit and for your ongoing commitment to your health.   Health Maintenance & Immunization History Health Maintenance  Topic Date Due   COVID-19 Vaccine (1) 04/05/2023 (Originally 08/15/1981)   Hepatitis C Screening  03/19/2024 (Originally 08/14/1996)   INFLUENZA VACCINE  06/20/2023   Medicare Annual Wellness (AWV)  03/19/2024   DTaP/Tdap/Td (3 - Td or Tdap) 05/01/2025   PAP SMEAR-Modifier  03/10/2026   HIV Screening  Completed   HPV VACCINES  Aged Out   Immunization History  Administered Date(s) Administered   Influenza-Unspecified 08/19/2016, 09/02/2016   Tdap 08/14/2009, 05/02/2015    These are the patient goals that we discussed:  Goals Addressed               This Visit's Progress     Patient Stated (pt-stated)        Patient stated she would like to get her depression manageable.         This is a list of Health Maintenance Items that are overdue or due now: Screening mammography - patient plans to call and schedule. GYN has placed the order.  Orders/Referrals Placed Today: No orders of the defined types were placed in this encounter.  (Contact our referral department at [phone] if you have not spoken with someone about your referral appointment within the next 5 days)    Follow-up Plan Follow-up with Christen Butter, NP as planned Medicare wellness visit in one year.  Patient will access AVS on my chart.      Health Maintenance, Female Adopting a healthy lifestyle and getting preventive care are important in promoting health and wellness. Ask your health care provider about: The right schedule for you to have regular tests and exams. Things you can do on your own to prevent diseases and keep yourself healthy. What should I know about diet, weight, and exercise? Eat a healthy  diet  Eat a diet that includes plenty of vegetables, fruits, low-fat dairy products, and lean protein. Do not eat a lot of foods that are high in solid fats, added sugars, or sodium. Maintain a healthy weight Body mass index (BMI) is used to identify weight problems. It estimates body fat based on height and weight. Your health care provider can help determine your BMI and help you achieve or maintain a healthy weight. Get regular exercise Get regular exercise. This is one of the most important things you can do for your health. Most adults should: Exercise for at least 150 minutes each week. The exercise should increase your heart rate and make you sweat (moderate-intensity exercise). Do strengthening exercises at least twice a week. This is in addition to the moderate-intensity exercise. Spend less time sitting. Even light physical activity can be beneficial. Watch cholesterol and blood lipids Have your blood tested for lipids and cholesterol at 45 years of age, then have this test every 5 years. Have your cholesterol levels checked more often if: Your lipid or cholesterol levels are high. You are older than 45 years of age. You are at high risk for heart disease. What should I know about cancer screening? Depending on your health history and family history, you may need to have cancer screening at various ages. This may include screening for: Breast cancer. Cervical cancer. Colorectal cancer. Skin cancer. Lung cancer. What should I know about heart disease, diabetes, and  high blood pressure? Blood pressure and heart disease High blood pressure causes heart disease and increases the risk of stroke. This is more likely to develop in people who have high blood pressure readings or are overweight. Have your blood pressure checked: Every 3-5 years if you are 84-81 years of age. Every year if you are 9 years old or older. Diabetes Have regular diabetes screenings. This checks your  fasting blood sugar level. Have the screening done: Once every three years after age 73 if you are at a normal weight and have a low risk for diabetes. More often and at a younger age if you are overweight or have a high risk for diabetes. What should I know about preventing infection? Hepatitis B If you have a higher risk for hepatitis B, you should be screened for this virus. Talk with your health care provider to find out if you are at risk for hepatitis B infection. Hepatitis C Testing is recommended for: Everyone born from [birth year] through 1963. Anyone with known risk factors for hepatitis C. Sexually transmitted infections (STIs) Get screened for STIs, including gonorrhea and chlamydia, if: You are sexually active and are younger than 45 years of age. You are older than 45 years of age and your health care provider tells you that you are at risk for this type of infection. Your sexual activity has changed since you were last screened, and you are at increased risk for chlamydia or gonorrhea. Ask your health care provider if you are at risk. Ask your health care provider about whether you are at high risk for HIV. Your health care provider may recommend a prescription medicine to help prevent HIV infection. If you choose to take medicine to prevent HIV, you should first get tested for HIV. You should then be tested every 3 months for as long as you are taking the medicine. Pregnancy If you are about to stop having your period (premenopausal) and you may become pregnant, seek counseling before you get pregnant. Take 400 to 800 micrograms (mcg) of folic acid every day if you become pregnant. Ask for birth control (contraception) if you want to prevent pregnancy. Osteoporosis and menopause Osteoporosis is a disease in which the bones lose minerals and strength with aging. This can result in bone fractures. If you are 85 years old or older, or if you are at risk for osteoporosis and fractures, ask  your health care provider if you should: Be screened for bone loss. Take a calcium or vitamin D supplement to lower your risk of fractures. Be given hormone replacement therapy (HRT) to treat symptoms of menopause. Follow these instructions at home: Alcohol use Do not drink alcohol if: Your health care provider tells you not to drink. You are pregnant, may be pregnant, or are planning to become pregnant. If you drink alcohol: Limit how much you have to: 0-1 drink a day. Know how much alcohol is in your drink. In the [place] one drink equals one 12 oz bottle of beer (355 mL), one 5 oz glass of wine (148 mL), or one 1 oz glass of hard liquor (44 mL). Lifestyle Do not use any products that contain nicotine or tobacco. These products include cigarettes, chewing tobacco, and vaping devices, such as e-cigarettes. If you need help quitting, ask your health care provider. Do not use street drugs. Do not share needles. Ask your health care provider for help if you need support or information about quitting drugs. General instructions Schedule regular health, dental,  and eye exams. Stay current with your vaccines. Tell your health care provider if: You often feel depressed. You have ever been abused or do not feel safe at home. Summary Adopting a healthy lifestyle and getting preventive care are important in promoting health and wellness. Follow your health care provider's instructions about healthy diet, exercising, and getting tested or screened for diseases. Follow your health care provider's instructions on monitoring your cholesterol and blood pressure. This information is not intended to replace advice given to you by your health care provider. Make sure you discuss any questions you have with your health care provider. Document Revised: 03/27/2021 Document Reviewed: 03/27/2021 Elsevier Patient Education  [place].

## 2023-03-20 NOTE — Progress Notes (Signed)
MEDICARE ANNUAL WELLNESS VISIT  03/20/2023  Telephone Visit Disclaimer This Medicare AWV was conducted by telephone due to national recommendations for restrictions regarding the COVID-19 Pandemic (e.g. social distancing).  I verified, using two identifiers, that I am speaking with Holly [name] or their authorized healthcare agent. I discussed the limitations, risks, security, and privacy concerns of performing an evaluation and management service by telephone and the potential availability of an in-person appointment in the future. The patient expressed understanding and agreed to proceed.  Location of Patient: Home Location of Provider (nurse):  In the office.  Subjective:    Holly [name] is a 47 y.o. female patient of Christen Butter, NP who had a Medicare Annual Wellness Visit today via telephone. Holly [name] is Legally disabled and lives with their family. she has 6 children. she reports that she is socially active and does interact with friends/family regularly. she is minimally physically active and enjoys reading.  Patient Care Team: Christen Butter, NP as PCP - General (Nurse Practitioner)     03/20/2023    9:51 AM 07/05/2020    6:10 AM 02/24/2019   11:25 AM 11/27/2016    9:33 AM 07/07/2015    2:01 AM 07/04/2015   11:00 AM 07/04/2015    4:56 AM  Advanced Directives  Does Patient Have a Medical Advance Directive? Yes No No No No No No  Type of Advance Directive Living will        Does patient want to make changes to medical advance directive? No - Patient declined        Would patient like information on creating a medical advance directive?  No - Patient declined Yes (MAU/Ambulatory/Procedural Areas - Information given)  No - patient declined information No - patient declined information No - patient declined information    Hospital Utilization Over the Past 12 Months: # of hospitalizations or ER visits: 0 # of surgeries: 0  Review of Systems    Patient reports that her overall  health is unchanged compared to last year.  History obtained from chart review and the patient  Patient Reported Readings (BP, Pulse, CBG, Weight, etc) none  Pain Assessment Pain : 0-10 Pain Score: 2  Pain Type: Acute pain Pain Location: Head Pain Descriptors / Indicators: Headache Pain Onset: Yesterday Pain Frequency: Intermittent     Current Medications & Allergies (verified) Allergies as of 03/20/2023       Reactions   Other Rash   Overly sleepy zyrtec   Terbutaline Palpitations   Emgality [galcanezumab-gnlm] Other (See Comments)   Per patient, rx now gives her GI problems and causes IBS symptoms to worsen.    Sumatriptan    Zavzpret [zavegepant]    Made Migraine worse   Adhesive [tape] Itching, Rash   Aimovig [erenumab-aooe] Rash   Celebrex [celecoxib] Rash   03/17/18 patient is unsure, thinks it may have been shaving cream   Cetirizine Hcl Other (See Comments)   Overly sleepy   Ciprofloxacin Rash   Swelling of knees   Wound Dressing Adhesive Itching, Rash        Medication List        Accurate as of Mar 20, 2023 10:10 AM. If you have any questions, ask your nurse or doctor.          Allergy Relief/Nasal Decongest 10-240 MG 24 hr tablet Generic drug: loratadine-pseudoephedrine TAKE ONE TABLET BY MOUTH DAILY   B-D 3CC LUER-LOK SYR 23GX1" 23G X 1" 3 ML Misc Generic drug:  SYRINGE-NEEDLE (DISP) 3 ML Attach to syringe to draw up and administer Toradol into the muscle.   B-D SYRINGE LUER-LOK 3CC 3 ML Misc Generic drug: Syringe (Disposable) Attach to needle to draw up and administer Toradol into the muscle.   baclofen 10 MG tablet Commonly known as: LIORESAL Take 1 tablet (10 mg total) by mouth 3 (three) times daily as needed for muscle spasms. Or for migraines.   Botox 200 units injection Generic drug: botulinum toxin Type A Provider inject 155 units into the muscles of the head and neck every 12 weeks. Discard remainder.    butalbital-acetaminophen-caffeine 50-325-40 MG tablet Commonly known as: FIORICET Take 1-2 tablets by mouth every 6 (six) hours as needed for headache.   clindamycin 1 % external solution Commonly known as: CLEOCIN T Apply topically 2 (two) times daily.   FLUoxetine 10 MG capsule Commonly known as: PROZAC Take 1 capsule (10 mg total) by mouth daily. Take with 20 mg capsules for total daily dose 30 mg   FLUoxetine 20 MG capsule Commonly known as: PROZAC Take 1 capsule (20 mg total) by mouth daily.   ibuprofen 800 MG tablet Commonly known as: ADVIL Take 1 tablet (800 mg total) by mouth 3 (three) times daily with meals as needed for headache, moderate pain or cramping.   ketorolac 60 MG/2ML Soln injection Commonly known as: TORADOL INJECT 2 ML INTRAMUSCULARLY EVERY 6 HOURS AS NEEDED FOR  MIGRANE. NO MORE THAN 120 MG ( ) IN  24 HOURS AND NO MORE THAN 4 DAYS PER MONTH.   methylPREDNISolone 4 MG tablet Commonly known as: MEDROL Take 1-3 tablets daily as needed for severe headache intractable to other means   mupirocin ointment 2 % Commonly known as: BACTROBAN Apply topically.   NEEDLE (DISP) 23 G 23G X 1" Misc Attach to syringe to draw up and administer Toradol into the muscle.   norethindrone 0.35 MG tablet Commonly known as: MICRONOR Take 1 tablet (0.35 mg total) by mouth daily.   omeprazole 40 MG capsule Commonly known as: PRILOSEC Take 1 capsule (40 mg total) by mouth daily.   ondansetron 8 MG disintegrating tablet Commonly known as: ZOFRAN-ODT Take 1 tablet (8 mg total) by mouth every 8 (eight) hours as needed for nausea.   prochlorperazine 10 MG tablet Commonly known as: COMPAZINE Take 1 tablet (10 mg total) by mouth every 6 (six) hours as needed.   promethazine 25 MG suppository Commonly known as: PHENERGAN Place 1 suppository (25 mg total) rectally every 6 (six) hours as needed for nausea or vomiting.   Qulipta 60 MG Tabs Generic drug: Atogepant Take 1  tablet (60 mg total) by mouth daily.   triamcinolone cream 0.1 % Commonly known as: KENALOG Apply 1 application topically 2 (two) times daily. To affected area(s) as needed   Trokendi XR 100 MG Cp24 Generic drug: Topiramate ER Take 100 mg by mouth at bedtime. Take with a 50mg  capsule for a total of 150mg  at bedtme   Trokendi XR 50 MG Cp24 Generic drug: Topiramate ER Take 50 mg by mouth at bedtime. Take with a 100mg  capsule for a total of 150mg  at bedtme.        History (reviewed): Past Medical History:  Diagnosis Date   Depression    Eczema    scalp   Family history of adverse reaction to anesthesia    mother struggles with sob, slow to awaken, n/v bedridden for days   Fibromyalgia 2018   GERD (gastroesophageal reflux disease)  Hemiplegic migraine    IBS (irritable bowel syndrome)    Migraine    MRSA (methicillin resistant staph aureus) culture positive not sure when   Vaginal Pap smear, abnormal    Past Surgical History:  Procedure Laterality Date   CERVICAL CONIZATION W/BX N/A 07/05/2020   Procedure: COLD KNIFE CONIZATION CERVIX WITH BIOPSY; COLPOSCOPY AND ENDOCERVICAL CURRETAGE;  Surgeon: Conan Bowens, MD;  Location: [facility];  Service: Gynecology;  Laterality: N/A;   COLPOSCOPY     x 3 with biopsy on 1 done   LEEP  2020   MULTIPLE TOOTH EXTRACTIONS     as child   WRIST SURGERY Left 2010   cyst removal   Family History  Problem Relation Age of Onset   Thyroid disease Mother    Scoliosis Mother    Depression Mother    Migraines Mother    Hyperlipidemia Mother    Hypertension Mother    Heart attack Mother    Crohn's disease Father    Autoimmune disease Father    Factor V Leiden deficiency Father    Heart failure Father    Diabetes Father    Hyperlipidemia Father    Heart attack Father    Cancer Sister    Depression Sister    Diabetes Sister    Diabetes Paternal Grandfather    Social History   Socioeconomic History   Marital  status: Legally Separated    Spouse name: Vincenza Hews   Number of children: 6   Years of education: 15   Highest education level: Some college, no degree  Occupational History   Occupation: Arts development officer  Tobacco Use   Smoking status: Never   Smokeless tobacco: Never  Vaping Use   Vaping Use: Never used  Substance and Sexual Activity   Alcohol use: No    Alcohol/week: 0.0 standard drinks of alcohol    Comment: none   Drug use: Never   Sexual activity: Yes    Birth control/protection: Pill  Other Topics Concern   Not on file  Social History Narrative   Lives at home with children. Husband is a Naval architect and rarely home. Has a lot of migraines. She enjoys reading.   Social Determinants of Health   Financial Resource Strain: Low Risk  (03/20/2023)   Overall Financial Resource Strain (CARDIA)    Difficulty of Paying Living Expenses: Not hard at all  Food Insecurity: No Food Insecurity (03/20/2023)   Hunger Vital Sign    Worried About Running Out of Food in the Last Year: Never true    Ran Out of Food in the Last Year: Never true  Transportation Needs: No Transportation Needs (03/20/2023)   PRAPARE - Administrator, Civil Service (Medical): No    Lack of Transportation (Non-Medical): No  Physical Activity: Inactive (03/20/2023)   Exercise Vital Sign    Days of Exercise per Week: 0 days    Minutes of Exercise per Session: 0 min  Stress: No Stress Concern Present (03/20/2023)   [employer] of Occupational Health - Occupational Stress Questionnaire    Feeling of Stress : Only a little  Social Connections: Moderately Isolated (03/20/2023)   Social Connection and Isolation Panel [NHANES]    Frequency of Communication with Friends and Family: Twice a week    Frequency of Social Gatherings with Friends and Family: More than three times a week    Attends Religious Services: More than 4 times per year    Active Member of  Clubs or Organizations: No    Attends Banker  Meetings: Never    Marital Status: Separated    Activities of Daily Living    03/20/2023    9:55 AM  In your present state of health, do you have any difficulty performing the following activities:  Hearing? 0  Vision? 0  Difficulty concentrating or making decisions? 1  Walking or climbing stairs? 0  Dressing or bathing? 0  Doing errands, shopping? 0  Preparing Food and eating ? N  Using the Toilet? N  In the past six months, have you accidently leaked urine? N  Do you have problems with loss of bowel control? N  Managing your Medications? N  Managing your Finances? N  Housekeeping or managing your Housekeeping? N    Patient Education/ Literacy How often do you need to have someone help you when you read instructions, pamphlets, or other written materials from your doctor or pharmacy?: 1 - Never What is the last grade level you completed in school?: some college  Exercise Current Exercise Habits: The patient does not participate in regular exercise at present, Exercise limited by: neurologic condition(s)  Diet Patient reports consuming 1 meals a day and 2 snack(s) a day Patient reports that her primary diet is: Regular Patient reports that she does have regular access to food.   Depression Screen    03/20/2023   10:05 AM 12/24/2022   11:33 AM 06/13/2022   11:34 AM 04/30/2022    4:01 PM 10/04/2021   11:29 AM 01/31/2021    1:40 PM 08/03/2020    2:01 PM  PHQ 2/9 Scores  PHQ - 2 Score 0 6 2 6 1 1 1   PHQ- 9 Score  18 10 23 3  7      Fall Risk    03/20/2023    9:51 AM 12/24/2022   11:33 AM 12/24/2022   10:54 AM 06/13/2022   11:33 AM 04/30/2022    4:01 PM  Fall Risk   Falls in the past year? 0 1 1 1  0  Number falls in past yr: 0 0 0 0 0  Injury with Fall? 0 1 1 1  0  Comment    In the tub - bruising   Risk for fall due to : No Fall Risks History of fall(s) History of fall(s) History of fall(s) No Fall Risks  Follow up Falls evaluation completed Falls evaluation completed Falls  evaluation completed Falls evaluation completed Falls evaluation completed     Objective:  Holly [name] seemed alert and oriented and she participated appropriately during our telephone visit.  Blood Pressure Weight BMI  BP Readings from Last 3 Encounters:  03/11/23 129/86  12/24/22 112/80  06/13/22 119/76   Wt Readings from Last 3 Encounters:  03/11/23 181 lb (82.1 kg)  12/24/22 180 lb 14.4 oz (82.1 kg)  06/13/22 183 lb (83 kg)   BMI Readings from Last 1 Encounters:  03/11/23 28.35 kg/m    *Unable to obtain current vital signs, weight, and BMI due to telephone visit type  Hearing/Vision  Holly [name] did not seem to have difficulty with hearing/understanding during the telephone conversation Reports that she has not had a formal eye exam by an eye care professional within the past year Reports that she has not had a formal hearing evaluation within the past year *Unable to fully assess hearing and vision during telephone visit type  Cognitive Function:    03/20/2023   10:06 AM 02/24/2019   11:30 AM  6CIT Screen  What Year? 0 points 0 points  What month? 0 points 0 points  What time? 0 points 0 points  Count back from 20 0 points 0 points  Months in reverse 2 points 0 points  Repeat phrase 0 points 0 points  Total Score 2 points 0 points   (Normal:0-7, Significant for Dysfunction: >8)  Normal Cognitive Function Screening: Yes   Immunization & Health Maintenance Record Immunization History  Administered Date(s) Administered   Influenza-Unspecified 08/19/2016, 09/02/2016   Tdap 08/14/2009, 05/02/2015    Health Maintenance  Topic Date Due   COVID-19 Vaccine (1) 04/05/2023 (Originally 08/15/1981)   Hepatitis C Screening  03/19/2024 (Originally 08/14/1994)   INFLUENZA VACCINE  06/20/2023   Medicare Annual Wellness (AWV)  03/19/2024   DTaP/Tdap/Td (3 - Td or Tdap) 05/01/2025   PAP SMEAR-Modifier  03/10/2026   HIV Screening  Completed   HPV VACCINES  Aged Out        Assessment  This is a routine wellness examination for Holly [name].  Health Maintenance: Due or Overdue There are no preventive care reminders to display for this patient.   Holly [name] does not need a referral for [employer] Assistance: Care Management:   no Social Work:    no Prescription Assistance:  no Nutrition/Diabetes Education:  no   Plan:  Personalized Goals  Goals Addressed               This Visit's Progress     Patient Stated (pt-stated)        Patient stated she would like to get her depression manageable.       Personalized Health Maintenance & Screening Recommendations  Screening mammography - patient plans to call and schedule. GYN has placed the order.  Lung Cancer Screening Recommended: no (Low Dose CT Chest recommended if Age 54-80 years, 20 pack-year currently smoking OR have quit w/in past 15 years) Hepatitis C Screening recommended: no HIV Screening recommended: no  Advanced Directives: Written information was not prepared per patient's request.  Referrals & Orders No orders of the defined types were placed in this encounter.   Follow-up Plan Follow-up with Christen Butter, NP as planned Medicare wellness visit in one year.  Patient will access AVS on my chart.   I have personally reviewed and noted the following in the patient's chart:   Medical and social history Use of alcohol, tobacco or illicit drugs  Current medications and supplements Functional ability and status Nutritional status Physical activity Advanced directives List of other physicians Hospitalizations, surgeries, and ER visits in previous 12 months Vitals Screenings to include cognitive, depression, and falls Referrals and appointments  In addition, I have reviewed and discussed with Holly [name] certain preventive protocols, quality metrics, and best practice recommendations. A written personalized care plan for preventive services as well as general  preventive health recommendations is available and can be mailed to the patient at her request.      Modesto Charon, RN BSN  03/20/2023

## 2023-03-20 NOTE — Telephone Encounter (Signed)
Received (1) 200 unit vial from [facility].

## 2023-03-26 ENCOUNTER — Ambulatory Visit (INDEPENDENT_AMBULATORY_CARE_PROVIDER_SITE_OTHER): Payer: 59 | Admitting: Neurology

## 2023-03-26 ENCOUNTER — Encounter: Payer: Self-pay | Admitting: Neurology

## 2023-03-26 DIAGNOSIS — G43711 Chronic migraine without aura, intractable, with status migrainosus: Secondary | ICD-10-CM

## 2023-03-26 MED ORDER — ONABOTULINUMTOXINA 200 UNITS IJ SOLR
155.0000 [IU] | Freq: Once | INTRAMUSCULAR | Status: AC
  Administered 2023-03-26: 155 [IU] via INTRAMUSCULAR

## 2023-03-26 MED ORDER — HYDROCODONE-ACETAMINOPHEN 5-325 MG PO TABS: 2.0000 | ORAL_TABLET | Freq: Four times a day (QID) | ORAL | 0 refills | Status: DC | PRN

## 2023-03-26 NOTE — Progress Notes (Signed)
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 CMA

## 2023-03-26 NOTE — Progress Notes (Signed)
Consent Form Botulism Toxin Injection For Chronic Migraine  03/26/2023: She had all her teeth removed due to dry mouth she had widespread teeth deterioiration and since then her migraines are exceedingly better. Now 4 migraines a month and < 10 total headache days a month  01/01/2023: stable refilled meds trying qulipta then vyepti  Meds ordered this encounter  Medications   botulinum toxin Type A (BOTOX) injection 155 Units    Botox- 200 units x 1 vial Lot: [id number] Expiration: 04/2025 NDC: 4540-9811-91  Bacteriostatic 0.9% Sodium Chloride- 4 mL  Lot: [id number] Expiration: 09/2024 NDC: 21308-657-84  Dx: O96.295  B/B Witnessed by Alveria Apley, CMA   HYDROcodone-acetaminophen (NORCO) 5-325 MG tablet    Sig: Take 2 tablets by mouth every 6 (six) hours as needed for moderate pain.    Dispense:  56 tablet    Refill:  0     10/09/2022: >50% improvement in migraine freq and severity. Zavagepant approved. Trying to get vyepti approved. She has already tried 2 other providers, she can stay with me for botox.   She gets > 50% improvement of frequency f migraine with botox but stll has residual: she still has > 10 migraine days a month and > 15 headache days a month. Botox still helps > 50% but still having residual chronic migraine will add on vyepti.  We have tried a plethora of medications.  We will try the vyept start 100mg  first injection and then increase to 300mg  if tolerated. She has tried all 3 injectables.    From a current review of records, medications tried in the past that can be used in migraine management include Tylenol, baclofen(did well but impaired her milk when breastfeeding), currently on Botox every 12 weeks, currently on limited Fioricet (limited supply every month, discussed rebound extensively with patient), Flexeril, Medrol Dosepak, Decadron injections, Benadryl, Aimovig, Prozac, Emgality, on a very limited supply of hydrocodone (again she is given a very limited supply  of hydrocodone to use only as absolutely needed), ibuprofen, seroquel, ketorolac oral and injections, we have prescribed Toradol injections for self administration at home, lasmiditan, Robaxin, Reglan, Zofran, prednisone tablets, Compazine tablets, Phenergan suppository, Seroquel, Nurtec, Zoloft, Topamax, Qudexy,Ubrelvy caused stomach discomfort. Lasmiditan did not work. allergic to celebrex, melatonin, she declines triptans(She had a great response to imitrex in the past but then had side effects which were likely due to the vasococonstrictive effects and possible TIA so contraindicated - also tried maxalt, treximet.), Amitrip, nortrip,  venlafaxine.  Blood pressure medications in the past have been contraindicated due to her very low blood pressure and pulse. She uses caffeine, Tylenol, Advil and Fioricet in that order to treat her acute headaches. In the past she has tried [place] with minimal benefit. Baclofen in the past helped but decreased her milk supply may consider trying again since she is no longer breast feeding. Has not tried Lyrica, Gabapentin(made her dizzy).. She does not want to try cymbalta because her son tried it and became suicidal. Her blood pressure is a lot better and now she is not bradycardic so next could also consider trying propranolol acutely or preventatively - tried propranolol 01/2022, Zevagepant made it worse  From 01/02/2022 visit: From a current review of records, medications tried in the past that can be used in migraine management include Tylenol, baclofen(did well but impaired her milk when breastfeeding), currently on Botox every 12 weeks, currently on limited Fioricet (limited supply every month, discussed rebound extensively with patient), Flexeril, Medrol Dosepak, Decadron injections,  Benadryl, Aimovig, Prozac, Emgality, on a very limited supply of hydrocodone (again she is given a very limited supply of hydrocodone to use only as absolutely needed), ibuprofen, seroquel,  ketorolac oral and injections, we have prescribed Toradol injections for self administration at home, lasmiditan, Robaxin, Reglan, Zofran, prednisone tablets, Compazine tablets, Phenergan suppository, Seroquel, Nurtec, Zoloft, Topamax, Qudexy,Ubrelvy caused stomach discomfort. Lasmiditan did not work. allergic to celebrex, melatonin, she declines triptans(She had a great response to imitrex in the past but then had side effects which were likely due to the vasococonstrictive effects and possible TIA so contraindicated - also tried maxalt, treximet.), Amitrip, nortrip,  venlafaxine.  Blood pressure medications in the past have been contraindicated due to her very low blood pressure and pulse and tried propranolol and had to discontinue due to side effects. She uses caffeine, Tylenol, Advil and Fioricet in that order to treat her acute headaches. In the past she has tried [place] with minimal benefit. Baclofen in the past helped but decreased her milk supply may consider trying again since she is no longer breast feeding. Has not tried Lyrica but Gabapentin made her swollen and dizzy. She does not want to try cymbalta because her son tried it and became suicidal. Her blood pressure is a lot better and now she is not bradycardic so next could also consider trying propranolol(tried, had to stop it due to side effects) tried IR and ER propranolol.   01/23/2022: Stable, the gabapentin made her dizzy but helped sleep. We will try propranolol at bedtime but watch very carefully for bradycardia and hypotension. If tolerated cn change to 60mg  ER at bedtime.     10/24/2021: Doing extremely well on botox > 70% improvement in frequency and severity of migraines and headaches per patient today.Ubrelvy caused stomach discomfort. Lasmiditan did not work. Tried Nurtec. Continue Fioricet, only acute med that works. She is not a clencher and she has no pain behind her eyes. We try to position the occipital and cervical injections  "around the pony tail area" and at occipitalis more using "follow the pain protocol"     Reviewed orally with patient, additionally signature is on file:Consent Form Botulism Toxin Injection For Chronic Migraine    Reviewed orally with patient, additionally signature is on file:  Botulism toxin has been approved by the Federal drug administration for treatment of chronic migraine. Botulism toxin does not cure chronic migraine and it may not be effective in some patients.  The administration of botulism toxin is accomplished by injecting a small amount of toxin into the muscles of the neck and head. Dosage must be titrated for each individual. Any benefits resulting from botulism toxin tend to wear off after 3 months with a repeat injection required if benefit is to be maintained. Injections are usually done every 3-4 months with maximum effect peak achieved by about 2 or 3 weeks. Botulism toxin is expensive and you should be sure of what costs you will incur resulting from the injection.  The side effects of botulism toxin use for chronic migraine may include:   -Transient, and usually mild, facial weakness with facial injections  -Transient, and usually mild, head or neck weakness with head/neck injections  -Reduction or loss of forehead facial animation due to forehead muscle weakness  -Eyelid drooping  -Dry eye  -Pain at the site of injection or bruising at the site of injection  -Double vision  -Potential unknown long term risks  Contraindications: You should not have Botox if you are pregnant,  nursing, allergic to albumin, have an infection, skin condition, or muscle weakness at the site of the injection, or have myasthenia gravis, Lambert-Eaton syndrome, or ALS.  It is also possible that as with any injection, there may be an allergic reaction or no effect from the medication. Reduced effectiveness after repeated injections is sometimes seen and rarely infection at the injection  site may occur. All care will be taken to prevent these side effects. If therapy is given over a long time, atrophy and wasting in the muscle injected may occur. Occasionally the patient's become refractory to treatment because they develop antibodies to the toxin. In this event, therapy needs to be modified.  I have read the above information and consent to the administration of botulism toxin.    BOTOX PROCEDURE NOTE FOR MIGRAINE HEADACHE    Contraindications and precautions discussed with patient(above). Aseptic procedure was observed and patient tolerated procedure. Procedure performed by Dr. Artemio Aly  The condition has existed for more than 6 months, and pt does not have a diagnosis of ALS, Myasthenia Gravis or Lambert-Eaton Syndrome.  Risks and benefits of injections discussed and pt agrees to proceed with the procedure.  Written consent obtained  These injections are medically necessary. Pt  receives good benefits from these injections. These injections do not cause sedations or hallucinations which the oral therapies may cause.  Description of procedure:  The patient was placed in a sitting position. The standard protocol was used for Botox as follows, with 5 units of Botox injected at each site:   -Procerus muscle, midline injection  -Corrugator muscle, bilateral injection  -Frontalis muscle, bilateral injection, with 2 sites each side, medial injection was performed in the upper one third of the frontalis muscle, in the region vertical from the medial inferior edge of the superior orbital rim. The lateral injection was again in the upper one third of the forehead vertically above the lateral limbus of the cornea, 1.5 cm lateral to the medial injection site.  -Temporalis muscle injection, 4 sites, bilaterally. The first injection was 3 cm above the tragus of the ear, second injection site was 1.5 cm to 3 cm up from the first injection site in line with the tragus of the ear. The  third injection site was 1.5-3 cm forward between the first 2 injection sites. The fourth injection site was 1.5 cm posterior to the second injection site.   -Occipitalis muscle injection, 3 sites, bilaterally. The first injection was done one half way between the occipital protuberance and the tip of the mastoid process behind the ear. The second injection site was done lateral and superior to the first, 1 fingerbreadth from the first injection. The third injection site was 1 fingerbreadth superiorly and medially from the first injection site.  -Cervical paraspinal muscle injection, 2 sites, bilateral knee first injection site was 1 cm from the midline of the cervical spine, 3 cm inferior to the lower border of the occipital protuberance. The second injection site was 1.5 cm superiorly and laterally to the first injection site.  -Trapezius muscle injection was performed at 3 sites, bilaterally. The first injection site was in the upper trapezius muscle halfway between the inflection point of the neck, and the acromion. The second injection site was one half way between the acromion and the first injection site. The third injection was done between the first injection site and the inflection point of the neck.   Will return for repeat injection in 3 months.   155  units of Botox was used, 45 U Botox not injected was wasted. The patient tolerated the procedure well, there were no complications of the above procedure.

## 2023-04-11 ENCOUNTER — Other Ambulatory Visit: Payer: Self-pay | Admitting: Neurology

## 2023-04-11 NOTE — Telephone Encounter (Signed)
Requested Prescriptions   Pending Prescriptions Disp Refills   butalbital-acetaminophen-caffeine (FIORICET) 50-325-40 MG tablet [Pharmacy Med Name: butalbital-acetaminophen-caffeine 50 mg-325 mg-40 mg tablet] 15 tablet 5    Sig: Take 1-2 tablets by mouth every 6 (six) hours as needed for headache.   Last seen 03/26/23. Next appt scheduled 07/02/23. Not due for refill until 04/22/23 refusing refill Dispenses   Dispensed Days Supply Quantity Provider Pharmacy  butalbital-acetaminophen-caffeine 50 mg-325 mg-40 mg tablet 03/22/2023 30 15 each Anson Fret, MD [facility] ...  butalbital-acetaminophen-caffeine 50 mg-325 mg-40 mg tablet 02/20/2023 30 15 each Anson Fret, MD [facility] ...  butalbital-acetaminophen-caffeine 50 mg-325 mg-40 mg tablet 01/23/2023 30 15 each Anson Fret, MD [facility] ...  butalbital-acetaminophen-caffeine 50 mg-325 mg-40 mg tablet 12/21/2022 30 15 each Anson Fret, MD [facility] ...  butalbital-acetaminophen-caffeine 50 mg-325 mg-40 mg tablet 11/17/2022 30 15 each Anson Fret, MD [facility] ...  butalbital-acetaminophen-caffeine 50 mg-325 mg-40 mg tablet 10/20/2022 30 15 each Anson Fret, MD [facility] ...  butalbital-acetaminophen-caffeine 50 mg-325 mg-40 mg tablet 08/18/2022 30 15 each Anson Fret, MD [facility] ...  butalbital-acetaminophen-caffeine 50 mg-325 mg-40 mg tablet 07/19/2022 30 15 each Anson Fret, MD [facility] ...  butalbital-acetaminophen-caffeine 50 mg-325 mg-40 mg tablet 05/17/2022 30 15 each Anson Fret, MD [facility] ...  butalbital-acetaminophen-caffeine 50 mg-325 mg-40 mg tablet 04/21/2022 30 15 each Anson Fret, MD [facility] .Marland KitchenMarland Kitchen

## 2023-04-19 ENCOUNTER — Other Ambulatory Visit: Payer: Self-pay | Admitting: Medical-Surgical

## 2023-04-19 ENCOUNTER — Other Ambulatory Visit: Payer: Self-pay | Admitting: Family Medicine

## 2023-04-19 ENCOUNTER — Other Ambulatory Visit: Payer: Self-pay | Admitting: Neurology

## 2023-04-19 NOTE — Telephone Encounter (Signed)
[place] pharmacy/patient requesting med refill for triamcinolone oint. Last refill was  2022. Is refill appropriate? Pls advise. Thanks.

## 2023-04-24 NOTE — Telephone Encounter (Signed)
Requested Prescriptions   Pending Prescriptions Disp Refills   butalbital-acetaminophen-caffeine (FIORICET) 50-325-40 MG tablet [Pharmacy Med Name: butalbital-acetaminophen-caffeine 50 mg-325 mg-40 mg tablet] 15 tablet 5    Sig: Take 1-2 tablets by mouth every 6 (six) hours as needed for headache.   Last seen 03/26/23, next appt scheduled for 07/02/23 Dispenses   Dispensed Days Supply Quantity Provider Pharmacy  butalbital-acetaminophen-caffeine 50 mg-325 mg-40 mg tablet 03/22/2023 30 15 each Anson Fret, MD [facility] ...  butalbital-acetaminophen-caffeine 50 mg-325 mg-40 mg tablet 02/20/2023 30 15 each Anson Fret, MD [facility] ...  butalbital-acetaminophen-caffeine 50 mg-325 mg-40 mg tablet 01/23/2023 30 15 each Anson Fret, MD [facility] ...  butalbital-acetaminophen-caffeine 50 mg-325 mg-40 mg tablet 12/21/2022 30 15 each Anson Fret, MD [facility] ...  butalbital-acetaminophen-caffeine 50 mg-325 mg-40 mg tablet 11/17/2022 30 15 each Anson Fret, MD [facility] ...  butalbital-acetaminophen-caffeine 50 mg-325 mg-40 mg tablet 10/20/2022 30 15 each Anson Fret, MD [facility] ...  butalbital-acetaminophen-caffeine 50 mg-325 mg-40 mg tablet 08/18/2022 30 15 each Anson Fret, MD [facility] ...  butalbital-acetaminophen-caffeine 50 mg-325 mg-40 mg tablet 07/19/2022 30 15 each Anson Fret, MD [facility] ...  butalbital-acetaminophen-caffeine 50 mg-325 mg-40 mg tablet 05/17/2022 30 15 each Anson Fret, MD [facility] .Marland KitchenMarland Kitchen

## 2023-05-02 ENCOUNTER — Other Ambulatory Visit: Payer: Self-pay | Admitting: Medical-Surgical

## 2023-06-06 ENCOUNTER — Other Ambulatory Visit (HOSPITAL_COMMUNITY): Payer: Self-pay

## 2023-06-17 ENCOUNTER — Other Ambulatory Visit: Payer: Self-pay | Admitting: Neurology

## 2023-06-21 ENCOUNTER — Other Ambulatory Visit: Payer: Self-pay

## 2023-06-24 ENCOUNTER — Ambulatory Visit: Payer: 59 | Admitting: Medical-Surgical

## 2023-06-25 ENCOUNTER — Other Ambulatory Visit (HOSPITAL_COMMUNITY): Payer: Self-pay

## 2023-06-26 ENCOUNTER — Other Ambulatory Visit (HOSPITAL_COMMUNITY): Payer: Self-pay

## 2023-06-27 ENCOUNTER — Other Ambulatory Visit (HOSPITAL_COMMUNITY): Payer: Self-pay

## 2023-06-28 ENCOUNTER — Other Ambulatory Visit: Payer: Self-pay

## 2023-07-02 ENCOUNTER — Ambulatory Visit (INDEPENDENT_AMBULATORY_CARE_PROVIDER_SITE_OTHER): Payer: 59 | Admitting: Neurology

## 2023-07-02 DIAGNOSIS — G43711 Chronic migraine without aura, intractable, with status migrainosus: Secondary | ICD-10-CM | POA: Diagnosis not present

## 2023-07-02 MED ORDER — HYDROCODONE-ACETAMINOPHEN 5-325 MG PO TABS: 2.0000 | ORAL_TABLET | Freq: Four times a day (QID) | ORAL | 0 refills | Status: DC | PRN

## 2023-07-02 MED ORDER — TROKENDI XR 50 MG PO CP24: ORAL_CAPSULE | ORAL | 3 refills | Status: DC

## 2023-07-02 MED ORDER — BACLOFEN 10 MG PO TABS
10.0000 mg | ORAL_TABLET | Freq: Three times a day (TID) | ORAL | 3 refills | Status: DC | PRN
Start: 2023-07-02 — End: 2024-03-16

## 2023-07-02 MED ORDER — BUTALBITAL-APAP-CAFFEINE 50-325-40 MG PO TABS: 1.0000 | ORAL_TABLET | Freq: Four times a day (QID) | ORAL | 5 refills | Status: DC | PRN

## 2023-07-02 MED ORDER — TROKENDI XR 100 MG PO CP24: ORAL_CAPSULE | ORAL | 3 refills | Status: DC

## 2023-07-02 MED ORDER — ONABOTULINUMTOXINA 200 UNITS IJ SOLR
155.0000 [IU] | Freq: Once | INTRAMUSCULAR | Status: AC
Start: 2023-07-02 — End: 2023-07-02
  Administered 2023-07-02: 155 [IU] via INTRAMUSCULAR

## 2023-07-02 MED ORDER — IBUPROFEN 800 MG PO TABS: 800.0000 mg | ORAL_TABLET | Freq: Three times a day (TID) | ORAL | 6 refills | Status: DC | PRN

## 2023-07-02 MED ORDER — METHYLPREDNISOLONE 4 MG PO TBPK: ORAL_TABLET | ORAL | 2 refills | Status: DC

## 2023-07-02 NOTE — Progress Notes (Signed)
Consent Form Botulism Toxin Injection For Chronic Migraine 07/02/2023: stable   03/26/2023: She had all her teeth removed due to dry mouth she had widespread teeth deterioiration and since then her migraines are exceedingly better. Now 4 migraines a month and < 10 total headache days a month  01/01/2023: stable refilled meds trying qulipta then vyepti  Meds ordered this encounter  Medications   botulinum toxin Type A (BOTOX) injection 155 Units    Botox- 200 units x 1 vial Lot: [id number] Expiration: 06/2025  NDC: 1308-6578-46  Bacteriostatic 0.9% Sodium Chloride- 4  mL  Lot: [id number] Expiration: 02/18/2024 NDC: 8413-2440-10  Dx: U72.536 S/P  Witnessed by Sheryle Hail ,CMA   baclofen (LIORESAL) 10 MG tablet    Sig: Take 1 tablet (10 mg total) by mouth 3 (three) times daily as needed for muscle spasms. Or for migraines.    Dispense:  270 each    Refill:  3    Cancel methocarbamol   butalbital-acetaminophen-caffeine (FIORICET) 50-325-40 MG tablet    Sig: Take 1-2 tablets by mouth every 6 (six) hours as needed for headache.    Dispense:  15 tablet    Refill:  5   HYDROcodone-acetaminophen (NORCO) 5-325 MG tablet    Sig: Take 2 tablets by mouth every 6 (six) hours as needed for moderate pain.    Dispense:  56 tablet    Refill:  0   ibuprofen (ADVIL) 800 MG tablet    Sig: Take 1 tablet (800 mg total) by mouth 3 (three) times daily with meals as needed for headache, moderate pain or cramping.    Dispense:  90 tablet    Refill:  6   methylPREDNISolone (MEDROL DOSEPAK) 4 MG TBPK tablet    Sig: Take 1-3 tablets by mouth daily as needed for severe headache intractable to other means    Dispense:  30 tablet    Refill:  2    This prescription was filled on 06/17/2023. Any refills authorized will be placed on file.   TROKENDI XR 100 MG CP24    Sig: Take 100 mg by mouth at bedtime. Take with a 50mg  capsule for a total of 150mg  at bedtme    Dispense:  90 capsule    Refill:  3    Brand only.  Failed generic.   TROKENDI XR 50 MG CP24    Sig: Take 50 mg by mouth at bedtime. Take with a 100mg  capsule for a total of 150mg  at bedtme.    Dispense:  90 capsule    Refill:  3    Brand only. Failed generic.     10/09/2022: >50% improvement in migraine freq and severity. Zavagepant approved. Trying to get vyepti approved. She has already tried 2 other providers, she can stay with me for botox.   She gets > 50% improvement of frequency f migraine with botox but stll has residual: she still has > 10 migraine days a month and > 15 headache days a month. Botox still helps > 50% but still having residual chronic migraine will add on vyepti.  We have tried a plethora of medications.  We will try the vyept start 100mg  first injection and then increase to 300mg  if tolerated. She has tried all 3 injectables.    From a current review of records, medications tried in the past that can be used in migraine management include Tylenol, baclofen(did well but impaired her milk when breastfeeding), currently on Botox every 12 weeks, currently on limited Fioricet (limited supply  every month, discussed rebound extensively with patient), Flexeril, Medrol Dosepak, Decadron injections, Benadryl, Aimovig, Prozac, Emgality, on a very limited supply of hydrocodone (again she is given a very limited supply of hydrocodone to use only as absolutely needed), ibuprofen, seroquel, ketorolac oral and injections, we have prescribed Toradol injections for self administration at home, lasmiditan, Robaxin, Reglan, Zofran, prednisone tablets, Compazine tablets, Phenergan suppository, Seroquel, Nurtec, Zoloft, Topamax, Qudexy,Ubrelvy caused stomach discomfort. Lasmiditan did not work. allergic to celebrex, melatonin, she declines triptans(She had a great response to imitrex in the past but then had side effects which were likely due to the vasococonstrictive effects and possible TIA so contraindicated - also tried maxalt, treximet.),  Amitrip, nortrip,  venlafaxine.  Blood pressure medications in the past have been contraindicated due to her very low blood pressure and pulse. She uses caffeine, Tylenol, Advil and Fioricet in that order to treat her acute headaches. In the past she has tried [place] with minimal benefit. Baclofen in the past helped but decreased her milk supply may consider trying again since she is no longer breast feeding. Has not tried Lyrica, Gabapentin(made her dizzy).. She does not want to try cymbalta because her son tried it and became suicidal. Her blood pressure is a lot better and now she is not bradycardic so next could also consider trying propranolol acutely or preventatively - tried propranolol 01/2022, Zevagepant made it worse  From 01/02/2022 visit: From a current review of records, medications tried in the past that can be used in migraine management include Tylenol, baclofen(did well but impaired her milk when breastfeeding), currently on Botox every 12 weeks, currently on limited Fioricet (limited supply every month, discussed rebound extensively with patient), Flexeril, Medrol Dosepak, Decadron injections, Benadryl, Aimovig, Prozac, Emgality, on a very limited supply of hydrocodone (again she is given a very limited supply of hydrocodone to use only as absolutely needed), ibuprofen, seroquel, ketorolac oral and injections, we have prescribed Toradol injections for self administration at home, lasmiditan, Robaxin, Reglan, Zofran, prednisone tablets, Compazine tablets, Phenergan suppository, Seroquel, Nurtec, Zoloft, Topamax, Qudexy,Ubrelvy caused stomach discomfort. Lasmiditan did not work. allergic to celebrex, melatonin, she declines triptans(She had a great response to imitrex in the past but then had side effects which were likely due to the vasococonstrictive effects and possible TIA so contraindicated - also tried maxalt, treximet.), Amitrip, nortrip,  venlafaxine.  Blood pressure medications in the past  have been contraindicated due to her very low blood pressure and pulse and tried propranolol and had to discontinue due to side effects. She uses caffeine, Tylenol, Advil and Fioricet in that order to treat her acute headaches. In the past she has tried [place] with minimal benefit. Baclofen in the past helped but decreased her milk supply may consider trying again since she is no longer breast feeding. Has not tried Lyrica but Gabapentin made her swollen and dizzy. She does not want to try cymbalta because her son tried it and became suicidal. Her blood pressure is a lot better and now she is not bradycardic so next could also consider trying propranolol(tried, had to stop it due to side effects) tried IR and ER propranolol.   01/23/2022: Stable, the gabapentin made her dizzy but helped sleep. We will try propranolol at bedtime but watch very carefully for bradycardia and hypotension. If tolerated cn change to 60mg  ER at bedtime.     10/24/2021: Doing extremely well on botox > 70% improvement in frequency and severity of migraines and headaches per patient today.Ubrelvy caused stomach  discomfort. Lasmiditan did not work. Tried Nurtec. Continue Fioricet, only acute med that works. She is not a clencher and she has no pain behind her eyes. We try to position the occipital and cervical injections "around the pony tail area" and at occipitalis more using "follow the pain protocol"     Reviewed orally with patient, additionally signature is on file:Consent Form Botulism Toxin Injection For Chronic Migraine    Reviewed orally with patient, additionally signature is on file:  Botulism toxin has been approved by the Federal drug administration for treatment of chronic migraine. Botulism toxin does not cure chronic migraine and it may not be effective in some patients.  The administration of botulism toxin is accomplished by injecting a small amount of toxin into the muscles of the neck and head. Dosage must  be titrated for each individual. Any benefits resulting from botulism toxin tend to wear off after 3 months with a repeat injection required if benefit is to be maintained. Injections are usually done every 3-4 months with maximum effect peak achieved by about 2 or 3 weeks. Botulism toxin is expensive and you should be sure of what costs you will incur resulting from the injection.  The side effects of botulism toxin use for chronic migraine may include:   -Transient, and usually mild, facial weakness with facial injections  -Transient, and usually mild, head or neck weakness with head/neck injections  -Reduction or loss of forehead facial animation due to forehead muscle weakness  -Eyelid drooping  -Dry eye  -Pain at the site of injection or bruising at the site of injection  -Double vision  -Potential unknown long term risks  Contraindications: You should not have Botox if you are pregnant, nursing, allergic to albumin, have an infection, skin condition, or muscle weakness at the site of the injection, or have myasthenia gravis, Lambert-Eaton syndrome, or ALS.  It is also possible that as with any injection, there may be an allergic reaction or no effect from the medication. Reduced effectiveness after repeated injections is sometimes seen and rarely infection at the injection site may occur. All care will be taken to prevent these side effects. If therapy is given over a long time, atrophy and wasting in the muscle injected may occur. Occasionally the patient's become refractory to treatment because they develop antibodies to the toxin. In this event, therapy needs to be modified.  I have read the above information and consent to the administration of botulism toxin.    BOTOX PROCEDURE NOTE FOR MIGRAINE HEADACHE    Contraindications and precautions discussed with patient(above). Aseptic procedure was observed and patient tolerated procedure. Procedure performed by Dr. Artemio Aly  The  condition has existed for more than 6 months, and pt does not have a diagnosis of ALS, Myasthenia Gravis or Lambert-Eaton Syndrome.  Risks and benefits of injections discussed and pt agrees to proceed with the procedure.  Written consent obtained  These injections are medically necessary. Pt  receives good benefits from these injections. These injections do not cause sedations or hallucinations which the oral therapies may cause.  Description of procedure:  The patient was placed in a sitting position. The standard protocol was used for Botox as follows, with 5 units of Botox injected at each site:   -Procerus muscle, midline injection  -Corrugator muscle, bilateral injection  -Frontalis muscle, bilateral injection, with 2 sites each side, medial injection was performed in the upper one third of the frontalis muscle, in the region vertical from the medial  inferior edge of the superior orbital rim. The lateral injection was again in the upper one third of the forehead vertically above the lateral limbus of the cornea, 1.5 cm lateral to the medial injection site.  -Temporalis muscle injection, 4 sites, bilaterally. The first injection was 3 cm above the tragus of the ear, second injection site was 1.5 cm to 3 cm up from the first injection site in line with the tragus of the ear. The third injection site was 1.5-3 cm forward between the first 2 injection sites. The fourth injection site was 1.5 cm posterior to the second injection site.   -Occipitalis muscle injection, 3 sites, bilaterally. The first injection was done one half way between the occipital protuberance and the tip of the mastoid process behind the ear. The second injection site was done lateral and superior to the first, 1 fingerbreadth from the first injection. The third injection site was 1 fingerbreadth superiorly and medially from the first injection site.  -Cervical paraspinal muscle injection, 2 sites, bilateral knee first  injection site was 1 cm from the midline of the cervical spine, 3 cm inferior to the lower border of the occipital protuberance. The second injection site was 1.5 cm superiorly and laterally to the first injection site.  -Trapezius muscle injection was performed at 3 sites, bilaterally. The first injection site was in the upper trapezius muscle halfway between the inflection point of the neck, and the acromion. The second injection site was one half way between the acromion and the first injection site. The third injection was done between the first injection site and the inflection point of the neck.   Will return for repeat injection in 3 months.   155  units of Botox was used, 45 U Botox not injected was wasted. The patient tolerated the procedure well, there were no complications of the above procedure.

## 2023-07-02 NOTE — Progress Notes (Signed)
Botox- 200 units x 1 vial Lot: [id number] Expiration: 06/2025  NDC: 4540-9811-91  Bacteriostatic 0.9% Sodium Chloride- 4  mL  Lot: [id number] Expiration: 02/18/2024 NDC: 6213-0865-78  Dx: I69.629 S/P  Witnessed by Sheryle Hail ,CMA

## 2023-09-02 NOTE — Telephone Encounter (Signed)
[employer] [facility] approval ref #: [id number] (09/02/23-12/03/23)

## 2023-09-18 ENCOUNTER — Other Ambulatory Visit: Payer: Self-pay

## 2023-09-18 ENCOUNTER — Other Ambulatory Visit (HOSPITAL_COMMUNITY): Payer: Self-pay

## 2023-09-18 NOTE — Progress Notes (Addendum)
Specialty Pharmacy Refill Coordination Note  Holly [name] is a 47 y.o. female contacted today regarding refills of specialty medication(s) Botox.    Patient requested Courier to Provider Office   Delivery date: 09/26/23   Verified address: [facility] [address], [place] [address]   Medication will be filled on 09/25/23.

## 2023-09-30 ENCOUNTER — Other Ambulatory Visit: Payer: Self-pay | Admitting: Family Medicine

## 2023-09-30 ENCOUNTER — Ambulatory Visit (INDEPENDENT_AMBULATORY_CARE_PROVIDER_SITE_OTHER): Payer: 59 | Admitting: Neurology

## 2023-09-30 DIAGNOSIS — G43711 Chronic migraine without aura, intractable, with status migrainosus: Secondary | ICD-10-CM | POA: Diagnosis not present

## 2023-09-30 DIAGNOSIS — E538 Deficiency of other specified B group vitamins: Secondary | ICD-10-CM | POA: Diagnosis not present

## 2023-09-30 DIAGNOSIS — R5383 Other fatigue: Secondary | ICD-10-CM

## 2023-09-30 DIAGNOSIS — Z832 Family history of diseases of the blood and blood-forming organs and certain disorders involving the immune mechanism: Secondary | ICD-10-CM

## 2023-09-30 DIAGNOSIS — E559 Vitamin D deficiency, unspecified: Secondary | ICD-10-CM | POA: Diagnosis not present

## 2023-09-30 DIAGNOSIS — E519 Thiamine deficiency, unspecified: Secondary | ICD-10-CM

## 2023-09-30 DIAGNOSIS — R7309 Other abnormal glucose: Secondary | ICD-10-CM | POA: Diagnosis not present

## 2023-09-30 DIAGNOSIS — E785 Hyperlipidemia, unspecified: Secondary | ICD-10-CM | POA: Diagnosis not present

## 2023-09-30 DIAGNOSIS — J302 Other seasonal allergic rhinitis: Secondary | ICD-10-CM

## 2023-09-30 MED ORDER — ONABOTULINUMTOXINA 200 UNITS IJ SOLR
155.0000 [IU] | Freq: Once | INTRAMUSCULAR | Status: AC
Start: 2023-09-30 — End: 2023-09-30
  Administered 2023-09-30: 155 [IU] via INTRAMUSCULAR

## 2023-09-30 NOTE — Progress Notes (Signed)
Consent Form Botulism Toxin Injection For Chronic Migraine  09/30/2023: She is not a clencher. No additional injections just botox straight protocol. Hemiplegic migraines. She has her teeth pulled, the dentures make her jaw hurt and gives her migraines and we could write a letter attesting to that and that she may need posts and teeth implanted, she may be perimenopausal, her diet has changed wince removal of her teeth, she doesn't eat like she usually does.   She is not feeling well, will order a full panel of tests and f/u pcp  07/02/2023: stable   03/26/2023: She had all her teeth removed due to dry mouth she had widespread teeth deterioiration and since then her migraines are exceedingly better. Now 4 migraines a month and < 10 total headache days a month  01/01/2023: stable refilled meds trying qulipta then vyepti  Meds ordered this encounter  Medications   botulinum toxin Type A (BOTOX) injection 155 Units    Botox- 200 units x 1 vial Lot: [id number] Expiration: 12/2025 NDC: 7425-9563-87  Dx: F64.332 S/P  Witnessed by Joellen Jersey RN     10/09/2022: >50% improvement in migraine freq and severity. Zavagepant approved. Trying to get vyepti approved. She has already tried 2 other providers, she can stay with me for botox.   She gets > 50% improvement of frequency f migraine with botox but stll has residual: she still has > 10 migraine days a month and > 15 headache days a month. Botox still helps > 50% but still having residual chronic migraine will add on vyepti.  We have tried a plethora of medications.  We will try the vyept start 100mg  first injection and then increase to 300mg  if tolerated. She has tried all 3 injectables.    From a current review of records, medications tried in the past that can be used in migraine management include Tylenol, baclofen(did well but impaired her milk when breastfeeding), currently on Botox every 12 weeks, currently on limited Fioricet (limited  supply every month, discussed rebound extensively with patient), Flexeril, Medrol Dosepak, Decadron injections, Benadryl, Aimovig, Prozac, Emgality, on a very limited supply of hydrocodone (again she is given a very limited supply of hydrocodone to use only as absolutely needed), ibuprofen, seroquel, ketorolac oral and injections, we have prescribed Toradol injections for self administration at home, lasmiditan, Robaxin, Reglan, Zofran, prednisone tablets, Compazine tablets, Phenergan suppository, Seroquel, Nurtec, Zoloft, Topamax, Qudexy,Ubrelvy caused stomach discomfort. Lasmiditan did not work. allergic to celebrex, melatonin, she declines triptans(She had a great response to imitrex in the past but then had side effects which were likely due to the vasococonstrictive effects and possible TIA so contraindicated - also tried maxalt, treximet.), Amitrip, nortrip,  venlafaxine.  Blood pressure medications in the past have been contraindicated due to her very low blood pressure and pulse. She uses caffeine, Tylenol, Advil and Fioricet in that order to treat her acute headaches. In the past she has tried [place] with minimal benefit. Baclofen in the past helped but decreased her milk supply may consider trying again since she is no longer breast feeding. Has not tried Lyrica, Gabapentin(made her dizzy).. She does not want to try cymbalta because her son tried it and became suicidal. Her blood pressure is a lot better and now she is not bradycardic so next could also consider trying propranolol acutely or preventatively - tried propranolol 01/2022, Zevagepant made it worse  From 01/02/2022 visit: From a current review of records, medications tried in the past that can be  used in migraine management include Tylenol, baclofen(did well but impaired her milk when breastfeeding), currently on Botox every 12 weeks, currently on limited Fioricet (limited supply every month, discussed rebound extensively with patient),  Flexeril, Medrol Dosepak, Decadron injections, Benadryl, Aimovig, Prozac, Emgality, on a very limited supply of hydrocodone (again she is given a very limited supply of hydrocodone to use only as absolutely needed), ibuprofen, seroquel, ketorolac oral and injections, we have prescribed Toradol injections for self administration at home, lasmiditan, Robaxin, Reglan, Zofran, prednisone tablets, Compazine tablets, Phenergan suppository, Seroquel, Nurtec, Zoloft, Topamax, Qudexy,Ubrelvy caused stomach discomfort. Lasmiditan did not work. allergic to celebrex, melatonin, she declines triptans(She had a great response to imitrex in the past but then had side effects which were likely due to the vasococonstrictive effects and possible TIA so contraindicated - also tried maxalt, treximet.), Amitrip, nortrip,  venlafaxine.  Blood pressure medications in the past have been contraindicated due to her very low blood pressure and pulse and tried propranolol and had to discontinue due to side effects. She uses caffeine, Tylenol, Advil and Fioricet in that order to treat her acute headaches. In the past she has tried [place] with minimal benefit. Baclofen in the past helped but decreased her milk supply may consider trying again since she is no longer breast feeding. Has not tried Lyrica but Gabapentin made her swollen and dizzy. She does not want to try cymbalta because her son tried it and became suicidal. Her blood pressure is a lot better and now she is not bradycardic so next could also consider trying propranolol(tried, had to stop it due to side effects) tried IR and ER propranolol.   01/23/2022: Stable, the gabapentin made her dizzy but helped sleep. We will try propranolol at bedtime but watch very carefully for bradycardia and hypotension. If tolerated cn change to 60mg  ER at bedtime.     10/24/2021: Doing extremely well on botox > 70% improvement in frequency and severity of migraines and headaches per patient  today.Ubrelvy caused stomach discomfort. Lasmiditan did not work. Tried Nurtec. Continue Fioricet, only acute med that works. She is not a clencher and she has no pain behind her eyes. We try to position the occipital and cervical injections "around the pony tail area" and at occipitalis more using "follow the pain protocol"     Reviewed orally with patient, additionally signature is on file:Consent Form Botulism Toxin Injection For Chronic Migraine    Reviewed orally with patient, additionally signature is on file:  Botulism toxin has been approved by the Federal drug administration for treatment of chronic migraine. Botulism toxin does not cure chronic migraine and it may not be effective in some patients.  The administration of botulism toxin is accomplished by injecting a small amount of toxin into the muscles of the neck and head. Dosage must be titrated for each individual. Any benefits resulting from botulism toxin tend to wear off after 3 months with a repeat injection required if benefit is to be maintained. Injections are usually done every 3-4 months with maximum effect peak achieved by about 2 or 3 weeks. Botulism toxin is expensive and you should be sure of what costs you will incur resulting from the injection.  The side effects of botulism toxin use for chronic migraine may include:   -Transient, and usually mild, facial weakness with facial injections  -Transient, and usually mild, head or neck weakness with head/neck injections  -Reduction or loss of forehead facial animation due to forehead muscle weakness  -Eyelid  drooping  -Dry eye  -Pain at the site of injection or bruising at the site of injection  -Double vision  -Potential unknown long term risks  Contraindications: You should not have Botox if you are pregnant, nursing, allergic to albumin, have an infection, skin condition, or muscle weakness at the site of the injection, or have myasthenia gravis, Lambert-Eaton  syndrome, or ALS.  It is also possible that as with any injection, there may be an allergic reaction or no effect from the medication. Reduced effectiveness after repeated injections is sometimes seen and rarely infection at the injection site may occur. All care will be taken to prevent these side effects. If therapy is given over a long time, atrophy and wasting in the muscle injected may occur. Occasionally the patient's become refractory to treatment because they develop antibodies to the toxin. In this event, therapy needs to be modified.  I have read the above information and consent to the administration of botulism toxin.    BOTOX PROCEDURE NOTE FOR MIGRAINE HEADACHE    Contraindications and precautions discussed with patient(above). Aseptic procedure was observed and patient tolerated procedure. Procedure performed by Dr. Artemio Aly  The condition has existed for more than 6 months, and pt does not have a diagnosis of ALS, Myasthenia Gravis or Lambert-Eaton Syndrome.  Risks and benefits of injections discussed and pt agrees to proceed with the procedure.  Written consent obtained  These injections are medically necessary. Pt  receives good benefits from these injections. These injections do not cause sedations or hallucinations which the oral therapies may cause.  Description of procedure:  The patient was placed in a sitting position. The standard protocol was used for Botox as follows, with 5 units of Botox injected at each site:   -Procerus muscle, midline injection  -Corrugator muscle, bilateral injection  -Frontalis muscle, bilateral injection, with 2 sites each side, medial injection was performed in the upper one third of the frontalis muscle, in the region vertical from the medial inferior edge of the superior orbital rim. The lateral injection was again in the upper one third of the forehead vertically above the lateral limbus of the cornea, 1.5 cm lateral to the medial  injection site.  -Temporalis muscle injection, 4 sites, bilaterally. The first injection was 3 cm above the tragus of the ear, second injection site was 1.5 cm to 3 cm up from the first injection site in line with the tragus of the ear. The third injection site was 1.5-3 cm forward between the first 2 injection sites. The fourth injection site was 1.5 cm posterior to the second injection site.   -Occipitalis muscle injection, 3 sites, bilaterally. The first injection was done one half way between the occipital protuberance and the tip of the mastoid process behind the ear. The second injection site was done lateral and superior to the first, 1 fingerbreadth from the first injection. The third injection site was 1 fingerbreadth superiorly and medially from the first injection site.  -Cervical paraspinal muscle injection, 2 sites, bilateral knee first injection site was 1 cm from the midline of the cervical spine, 3 cm inferior to the lower border of the occipital protuberance. The second injection site was 1.5 cm superiorly and laterally to the first injection site.  -Trapezius muscle injection was performed at 3 sites, bilaterally. The first injection site was in the upper trapezius muscle halfway between the inflection point of the neck, and the acromion. The second injection site was one half way between  the acromion and the first injection site. The third injection was done between the first injection site and the inflection point of the neck.   Will return for repeat injection in 3 months.   155  units of Botox was used, 45 U Botox not injected was wasted. The patient tolerated the procedure well, there were no complications of the above procedure.

## 2023-09-30 NOTE — Progress Notes (Signed)
Botox consent signed   Botox- 200 units x 1 vial Lot: [id number] Expiration: 12/2025 NDC: 4166-0630-16  Bacteriostatic 0.9% Sodium Chloride- 4 mL  Lot: [id number] Expiration: 02/18/2024 NDC: 3557-3220-25  Dx: K27.062 S/P  Witnessed by Joellen Jersey RN

## 2023-10-01 LAB — TSH RFX ON ABNORMAL TO FREE T4: TSH: 1.22 u[IU]/mL (ref 0.450–4.500)

## 2023-10-05 ENCOUNTER — Encounter: Payer: Self-pay | Admitting: Neurology

## 2023-10-05 LAB — CBC WITH DIFFERENTIAL/PLATELET
Basophils Absolute: 0 10*3/uL (ref 0.0–0.2)
Basos: 0 %
EOS (ABSOLUTE): 0.3 10*3/uL (ref 0.0–0.4)
Eos: 4 %
Hematocrit: 39.6 % (ref 34.0–46.6)
Hemoglobin: 12.8 g/dL (ref 11.1–15.9)
Immature Grans (Abs): 0 10*3/uL (ref 0.0–0.1)
Immature Granulocytes: 0 %
Lymphocytes Absolute: 1.9 10*3/uL (ref 0.7–3.1)
Lymphs: 27 %
MCH: 30.2 pg (ref 26.6–33.0)
MCHC: 32.3 g/dL (ref 31.5–35.7)
MCV: 93 fL (ref 79–97)
Monocytes Absolute: 0.4 10*3/uL (ref 0.1–0.9)
Monocytes: 6 %
Neutrophils Absolute: 4.6 10*3/uL (ref 1.4–7.0)
Neutrophils: 63 %
Platelets: 294 10*3/uL (ref 150–450)
RBC: 4.24 x10E6/uL (ref 3.77–5.28)
RDW: 12.7 % (ref 11.7–15.4)
WBC: 7.2 10*3/uL (ref 3.4–10.8)

## 2023-10-05 LAB — VITAMIN B1: Thiamine: 154 nmol/L (ref 66.5–200.0)

## 2023-10-05 LAB — COMPREHENSIVE METABOLIC PANEL
ALT: 9 [IU]/L (ref 0–32)
AST: 14 [IU]/L (ref 0–40)
Albumin: 4.3 g/dL (ref 3.9–4.9)
Alkaline Phosphatase: 97 IU/L (ref 44–121)
BUN/Creatinine Ratio: 6 — ABNORMAL LOW (ref 9–23)
BUN: 6 mg/dL (ref 6–24)
Bilirubin Total: 0.3 mg/dL (ref 0.0–1.2)
CO2: 21 mmol/L (ref 20–29)
Calcium: 9 mg/dL (ref 8.7–10.2)
Chloride: 109 mmol/L — ABNORMAL HIGH (ref 96–106)
Creatinine, Ser: 1 mg/dL (ref 0.57–1.00)
Globulin, Total: 2.7 g/dL (ref 1.5–4.5)
Glucose: 99 mg/dL (ref 70–99)
Potassium: 4.4 mmol/L (ref 3.5–5.2)
Sodium: 141 mmol/L (ref 134–144)
Total Protein: 7 g/dL (ref 6.0–8.5)
eGFR: 71 mL/min/{1.73_m2} (ref >59)

## 2023-10-05 LAB — LIPID PANEL
Chol/HDL Ratio: 5.2 ratio — ABNORMAL HIGH (ref 0.0–4.4)
Cholesterol, Total: 197 mg/dL (ref 100–199)
HDL: 38 mg/dL — ABNORMAL LOW (ref >39)
LDL Chol Calc (NIH): 139 mg/dL — ABNORMAL HIGH (ref 0–99)
Triglycerides: 112 mg/dL (ref 0–149)
VLDL Cholesterol Cal: 20 mg/dL (ref 5–40)

## 2023-10-05 LAB — SJOGREN'S SYNDROME ANTIBODS(SSA + SSB)
ENA SSA (RO) Ab: <0.2 AI (ref 0.0–0.9)
ENA SSB (LA) Ab: <0.2 AI (ref 0.0–0.9)

## 2023-10-05 LAB — HEMOGLOBIN A1C
Est. average glucose Bld gHb Est-mCnc: 105 mg/dL
Hgb A1c MFr Bld: 5.3 % (ref 4.8–5.6)

## 2023-10-05 LAB — MAGNESIUM: Magnesium: 2.2 mg/dL (ref 1.6–2.3)

## 2023-10-05 LAB — B12 AND FOLATE PANEL
Folate: 11 ng/mL (ref >3.0)
Vitamin B-12: 447 pg/mL (ref 232–1245)

## 2023-10-05 LAB — VITAMIN D 25 HYDROXY (VIT D DEFICIENCY, FRACTURES): Vit D, 25-Hydroxy: 42.9 ng/mL (ref 30.0–100.0)

## 2023-10-05 LAB — SEDIMENTATION RATE: Sed Rate: 4 mm/h (ref 0–32)

## 2023-10-05 LAB — C-REACTIVE PROTEIN: CRP: 3 mg/L (ref 0–10)

## 2023-10-05 LAB — METHYLMALONIC ACID, SERUM: Methylmalonic Acid: 180 nmol/L (ref 0–378)

## 2023-10-05 LAB — ANTINUCLEAR ANTIBODIES, IFA: ANA Titer 1: NEGATIVE

## 2023-10-07 ENCOUNTER — Other Ambulatory Visit: Payer: Self-pay | Admitting: Neurology

## 2023-10-07 MED ORDER — HYDROCODONE-ACETAMINOPHEN 5-325 MG PO TABS: 2.0000 | ORAL_TABLET | Freq: Four times a day (QID) | ORAL | 0 refills | Status: DC | PRN

## 2023-10-07 MED ORDER — METHYLPREDNISOLONE 4 MG PO TBPK: ORAL_TABLET | ORAL | 2 refills | Status: DC

## 2023-10-07 MED ORDER — BUTALBITAL-APAP-CAFFEINE 50-325-40 MG PO TABS: 1.0000 | ORAL_TABLET | Freq: Four times a day (QID) | ORAL | 5 refills | Status: DC | PRN

## 2023-10-21 NOTE — Telephone Encounter (Signed)
I spoke with the patient.  She apologized for missing the MyChart message.  She asked if the cholesterol could be managed with a diet change.  If she needs to go on medication she certainly will.  She's had trouble with her dentures and is having to eat soft foods all the time and her diet has not been very healthy lately. I told her I would ask Dr Lucia Gaskins and let her know. Pt was appreciative.

## 2023-11-06 ENCOUNTER — Other Ambulatory Visit: Payer: Self-pay | Admitting: Neurology

## 2023-11-11 ENCOUNTER — Other Ambulatory Visit: Payer: Self-pay | Admitting: Medical-Surgical

## 2023-11-11 DIAGNOSIS — J302 Other seasonal allergic rhinitis: Secondary | ICD-10-CM

## 2023-11-29 ENCOUNTER — Other Ambulatory Visit: Payer: Self-pay | Admitting: Medical-Surgical

## 2023-11-29 DIAGNOSIS — J302 Other seasonal allergic rhinitis: Secondary | ICD-10-CM

## 2023-12-02 NOTE — Telephone Encounter (Signed)
 Auth was approved, pt will continue to fill through [facility].  Auth#: [id number] (12/02/23-03/01/24)

## 2023-12-02 NOTE — Telephone Encounter (Signed)
 Submitted auth renewal to [facility] via CMM, status is pending. Key: Julaine Hua

## 2023-12-05 ENCOUNTER — Other Ambulatory Visit: Payer: Self-pay

## 2023-12-09 ENCOUNTER — Other Ambulatory Visit: Payer: Self-pay

## 2023-12-09 ENCOUNTER — Other Ambulatory Visit: Payer: Self-pay | Admitting: Neurology

## 2023-12-09 NOTE — Progress Notes (Signed)
Specialty Pharmacy Refill Coordination Note  Holly [name] is a 48 y.o. female contacted today regarding refills of specialty medication(s) OnabotulinumtoxinA (BOTOX)   Patient requested Courier to Provider Office   Delivery date: 12/17/23   Verified address: [facility] [address], [address]   [place] [address]   Medication will be filled on 12/16/23.   Pending refill request

## 2023-12-10 ENCOUNTER — Other Ambulatory Visit: Payer: Self-pay

## 2023-12-10 MED ORDER — BOTOX 200 UNITS IJ SOLR
INTRAMUSCULAR | 3 refills | Status: AC
  Filled 2023-12-10: qty 1, 84d supply, fill #0
  Filled 2024-03-02: qty 1, 84d supply, fill #1
  Filled 2024-05-29 – 2024-06-17 (×2): qty 1, 84d supply, fill #2
  Filled 2024-09-11: qty 1, 84d supply, fill #3

## 2023-12-16 ENCOUNTER — Other Ambulatory Visit: Payer: Self-pay

## 2023-12-19 ENCOUNTER — Other Ambulatory Visit: Payer: Self-pay | Admitting: Medical-Surgical

## 2023-12-19 DIAGNOSIS — F339 Major depressive disorder, recurrent, unspecified: Secondary | ICD-10-CM

## 2023-12-20 ENCOUNTER — Other Ambulatory Visit: Payer: Self-pay | Admitting: Medical-Surgical

## 2023-12-20 DIAGNOSIS — N921 Excessive and frequent menstruation with irregular cycle: Secondary | ICD-10-CM

## 2023-12-20 DIAGNOSIS — G43409 Hemiplegic migraine, not intractable, without status migrainosus: Secondary | ICD-10-CM

## 2023-12-23 ENCOUNTER — Ambulatory Visit (INDEPENDENT_AMBULATORY_CARE_PROVIDER_SITE_OTHER): Payer: 59 | Admitting: Neurology

## 2023-12-23 DIAGNOSIS — G43711 Chronic migraine without aura, intractable, with status migrainosus: Secondary | ICD-10-CM | POA: Diagnosis not present

## 2023-12-23 MED ORDER — ONABOTULINUMTOXINA 200 UNITS IJ SOLR
155.0000 [IU] | Freq: Once | INTRAMUSCULAR | Status: AC
  Administered 2023-12-23: 155 [IU] via INTRAMUSCULAR

## 2023-12-23 NOTE — Progress Notes (Signed)
Consent Form Botulism Toxin Injection For Chronic Migraine  12/23/2023: Still gets great benefit now Now 4 migraines a month and < 10 total headache days a month  09/30/2023: She is not a clencher. No additional injections just botox straight protocol. Hemiplegic migraines. She has her teeth pulled, the dentures make her jaw hurt and gives her migraines and we could write a letter attesting to that and that she may need posts and teeth implanted, she may be perimenopausal, her diet has changed wince removal of her teeth, she doesn't eat like she usually does.   She is not feeling well, will order a full panel of tests and f/u pcp  07/02/2023: stable   03/26/2023: She had all her teeth removed due to dry mouth she had widespread teeth deterioiration and since then her migraines are exceedingly better. Now 4 migraines a month and < 10 total headache days a month  01/01/2023: stable refilled meds trying qulipta then vyepti  Meds ordered this encounter  Medications   botulinum toxin Type A (BOTOX) injection 155 Units    Botox- 200 units x 1 vial Lot: [id number] Expiration: 02/2026 NDC: 3086-5784-69  Dx: G29.528 S/P  Witnessed by Truitt Leep RN     10/09/2022: >50% improvement in migraine freq and severity. Zavagepant approved. Trying to get vyepti approved. She has already tried 2 other providers, she can stay with me for botox.   She gets > 50% improvement of frequency f migraine with botox but stll has residual: she still has > 10 migraine days a month and > 15 headache days a month. Botox still helps > 50% but still having residual chronic migraine will add on vyepti.  We have tried a plethora of medications.  We will try the vyept start 100mg  first injection and then increase to 300mg  if tolerated. She has tried all 3 injectables.    From a current review of records, medications tried in the past that can be used in migraine management include Tylenol, baclofen(did well but impaired her  milk when breastfeeding), currently on Botox every 12 weeks, currently on limited Fioricet (limited supply every month, discussed rebound extensively with patient), Flexeril, Medrol Dosepak, Decadron injections, Benadryl, Aimovig, Prozac, Emgality, on a very limited supply of hydrocodone (again she is given a very limited supply of hydrocodone to use only as absolutely needed), ibuprofen, seroquel, ketorolac oral and injections, we have prescribed Toradol injections for self administration at home, lasmiditan, Robaxin, Reglan, Zofran, prednisone tablets, Compazine tablets, Phenergan suppository, Seroquel, Nurtec, Zoloft, Topamax, Qudexy,Ubrelvy caused stomach discomfort. Lasmiditan did not work. allergic to celebrex, melatonin, she declines triptans(She had a great response to imitrex in the past but then had side effects which were likely due to the vasococonstrictive effects and possible TIA so contraindicated - also tried maxalt, treximet.), Amitrip, nortrip,  venlafaxine.  Blood pressure medications in the past have been contraindicated due to her very low blood pressure and pulse. She uses caffeine, Tylenol, Advil and Fioricet in that order to treat her acute headaches. In the past she has tried [place] with minimal benefit. Baclofen in the past helped but decreased her milk supply may consider trying again since she is no longer breast feeding. Has not tried Lyrica, Gabapentin(made her dizzy).. She does not want to try cymbalta because her son tried it and became suicidal. Her blood pressure is a lot better and now she is not bradycardic so next could also consider trying propranolol acutely or preventatively - tried propranolol 01/2022, Zevagepant made  it worse  From 01/02/2022 visit: From a current review of records, medications tried in the past that can be used in migraine management include Tylenol, baclofen(did well but impaired her milk when breastfeeding), currently on Botox every 12 weeks, currently on  limited Fioricet (limited supply every month, discussed rebound extensively with patient), Flexeril, Medrol Dosepak, Decadron injections, Benadryl, Aimovig, Prozac, Emgality, on a very limited supply of hydrocodone (again she is given a very limited supply of hydrocodone to use only as absolutely needed), ibuprofen, seroquel, ketorolac oral and injections, we have prescribed Toradol injections for self administration at home, lasmiditan, Robaxin, Reglan, Zofran, prednisone tablets, Compazine tablets, Phenergan suppository, Seroquel, Nurtec, Zoloft, Topamax, Qudexy,Ubrelvy caused stomach discomfort. Lasmiditan did not work. allergic to celebrex, melatonin, she declines triptans(She had a great response to imitrex in the past but then had side effects which were likely due to the vasococonstrictive effects and possible TIA so contraindicated - also tried maxalt, treximet.), Amitrip, nortrip,  venlafaxine.  Blood pressure medications in the past have been contraindicated due to her very low blood pressure and pulse and tried propranolol and had to discontinue due to side effects. She uses caffeine, Tylenol, Advil and Fioricet in that order to treat her acute headaches. In the past she has tried [place] with minimal benefit. Baclofen in the past helped but decreased her milk supply may consider trying again since she is no longer breast feeding. Has not tried Lyrica but Gabapentin made her swollen and dizzy. She does not want to try cymbalta because her son tried it and became suicidal. Her blood pressure is a lot better and now she is not bradycardic so next could also consider trying propranolol(tried, had to stop it due to side effects) tried IR and ER propranolol.   01/23/2022: Stable, the gabapentin made her dizzy but helped sleep. We will try propranolol at bedtime but watch very carefully for bradycardia and hypotension. If tolerated cn change to 60mg  ER at bedtime.     10/24/2021: Doing extremely well on botox  > 70% improvement in frequency and severity of migraines and headaches per patient today.Ubrelvy caused stomach discomfort. Lasmiditan did not work. Tried Nurtec. Continue Fioricet, only acute med that works. She is not a clencher and she has no pain behind her eyes. We try to position the occipital and cervical injections "around the pony tail area" and at occipitalis more using "follow the pain protocol"     Reviewed orally with patient, additionally signature is on file:Consent Form Botulism Toxin Injection For Chronic Migraine    Reviewed orally with patient, additionally signature is on file:  Botulism toxin has been approved by the Federal drug administration for treatment of chronic migraine. Botulism toxin does not cure chronic migraine and it may not be effective in some patients.  The administration of botulism toxin is accomplished by injecting a small amount of toxin into the muscles of the neck and head. Dosage must be titrated for each individual. Any benefits resulting from botulism toxin tend to wear off after 3 months with a repeat injection required if benefit is to be maintained. Injections are usually done every 3-4 months with maximum effect peak achieved by about 2 or 3 weeks. Botulism toxin is expensive and you should be sure of what costs you will incur resulting from the injection.  The side effects of botulism toxin use for chronic migraine may include:   -Transient, and usually mild, facial weakness with facial injections  -Transient, and usually mild, head or  neck weakness with head/neck injections  -Reduction or loss of forehead facial animation due to forehead muscle weakness  -Eyelid drooping  -Dry eye  -Pain at the site of injection or bruising at the site of injection  -Double vision  -Potential unknown long term risks  Contraindications: You should not have Botox if you are pregnant, nursing, allergic to albumin, have an infection, skin condition, or  muscle weakness at the site of the injection, or have myasthenia gravis, Lambert-Eaton syndrome, or ALS.  It is also possible that as with any injection, there may be an allergic reaction or no effect from the medication. Reduced effectiveness after repeated injections is sometimes seen and rarely infection at the injection site may occur. All care will be taken to prevent these side effects. If therapy is given over a long time, atrophy and wasting in the muscle injected may occur. Occasionally the patient's become refractory to treatment because they develop antibodies to the toxin. In this event, therapy needs to be modified.  I have read the above information and consent to the administration of botulism toxin.    BOTOX PROCEDURE NOTE FOR MIGRAINE HEADACHE    Contraindications and precautions discussed with patient(above). Aseptic procedure was observed and patient tolerated procedure. Procedure performed by Dr. Artemio Aly  The condition has existed for more than 6 months, and pt does not have a diagnosis of ALS, Myasthenia Gravis or Lambert-Eaton Syndrome.  Risks and benefits of injections discussed and pt agrees to proceed with the procedure.  Written consent obtained  These injections are medically necessary. Pt  receives good benefits from these injections. These injections do not cause sedations or hallucinations which the oral therapies may cause.  Description of procedure:  The patient was placed in a sitting position. The standard protocol was used for Botox as follows, with 5 units of Botox injected at each site:   -Procerus muscle, midline injection  -Corrugator muscle, bilateral injection  -Frontalis muscle, bilateral injection, with 2 sites each side, medial injection was performed in the upper one third of the frontalis muscle, in the region vertical from the medial inferior edge of the superior orbital rim. The lateral injection was again in the upper one third of the  forehead vertically above the lateral limbus of the cornea, 1.5 cm lateral to the medial injection site.  -Temporalis muscle injection, 4 sites, bilaterally. The first injection was 3 cm above the tragus of the ear, second injection site was 1.5 cm to 3 cm up from the first injection site in line with the tragus of the ear. The third injection site was 1.5-3 cm forward between the first 2 injection sites. The fourth injection site was 1.5 cm posterior to the second injection site.   -Occipitalis muscle injection, 3 sites, bilaterally. The first injection was done one half way between the occipital protuberance and the tip of the mastoid process behind the ear. The second injection site was done lateral and superior to the first, 1 fingerbreadth from the first injection. The third injection site was 1 fingerbreadth superiorly and medially from the first injection site.  -Cervical paraspinal muscle injection, 2 sites, bilateral knee first injection site was 1 cm from the midline of the cervical spine, 3 cm inferior to the lower border of the occipital protuberance. The second injection site was 1.5 cm superiorly and laterally to the first injection site.  -Trapezius muscle injection was performed at 3 sites, bilaterally. The first injection site was in the upper trapezius muscle  halfway between the inflection point of the neck, and the acromion. The second injection site was one half way between the acromion and the first injection site. The third injection was done between the first injection site and the inflection point of the neck.   Will return for repeat injection in 3 months.   155  units of Botox was used, 45 U Botox not injected was wasted. The patient tolerated the procedure well, there were no complications of the above procedure.

## 2023-12-23 NOTE — Progress Notes (Signed)
Botox- 200 units x 1 vial Lot: [id number] Expiration: 02/2026 NDC: 8119-1478-29  Bacteriostatic 0.9% Sodium Chloride- 4 mL  Lot: [id number] Expiration: 09/19/2024 NDC: 8657-8469-62  Dx: X52.841 S/P  Witnessed by Truitt Leep RN

## 2023-12-28 ENCOUNTER — Other Ambulatory Visit: Payer: Self-pay | Admitting: Neurology

## 2023-12-28 ENCOUNTER — Other Ambulatory Visit: Payer: Self-pay | Admitting: Medical-Surgical

## 2023-12-28 DIAGNOSIS — F339 Major depressive disorder, recurrent, unspecified: Secondary | ICD-10-CM

## 2023-12-29 DIAGNOSIS — B9689 Other specified bacterial agents as the cause of diseases classified elsewhere: Secondary | ICD-10-CM | POA: Diagnosis not present

## 2023-12-29 DIAGNOSIS — J208 Acute bronchitis due to other specified organisms: Secondary | ICD-10-CM | POA: Diagnosis not present

## 2023-12-29 DIAGNOSIS — M791 Myalgia, unspecified site: Secondary | ICD-10-CM | POA: Diagnosis not present

## 2023-12-29 DIAGNOSIS — R509 Fever, unspecified: Secondary | ICD-10-CM | POA: Diagnosis not present

## 2023-12-29 DIAGNOSIS — R059 Cough, unspecified: Secondary | ICD-10-CM | POA: Diagnosis not present

## 2024-01-21 ENCOUNTER — Ambulatory Visit: Admitting: Medical-Surgical

## 2024-01-24 ENCOUNTER — Telehealth: Payer: Self-pay | Admitting: *Deleted

## 2024-01-24 NOTE — Telephone Encounter (Signed)
Left patient a message to call and schedule annual. 

## 2024-01-28 ENCOUNTER — Encounter: Payer: Self-pay | Admitting: Medical-Surgical

## 2024-01-28 ENCOUNTER — Ambulatory Visit (INDEPENDENT_AMBULATORY_CARE_PROVIDER_SITE_OTHER): Admitting: Medical-Surgical

## 2024-01-28 VITALS — BP 130/80 | HR 85 | Ht 67.0 in | Wt 191.0 lb

## 2024-01-28 DIAGNOSIS — F339 Major depressive disorder, recurrent, unspecified: Secondary | ICD-10-CM | POA: Diagnosis not present

## 2024-01-28 DIAGNOSIS — Z832 Family history of diseases of the blood and blood-forming organs and certain disorders involving the immune mechanism: Secondary | ICD-10-CM

## 2024-01-28 DIAGNOSIS — Z840 Family history of diseases of the skin and subcutaneous tissue: Secondary | ICD-10-CM

## 2024-01-28 MED ORDER — OMEPRAZOLE 40 MG PO CPDR: 40.0000 mg | DELAYED_RELEASE_CAPSULE | Freq: Every day | ORAL | 3 refills | Status: DC

## 2024-01-28 MED ORDER — FLUOXETINE HCL 20 MG PO CAPS: 20.0000 mg | ORAL_CAPSULE | Freq: Every day | ORAL | 3 refills | Status: DC

## 2024-01-28 MED ORDER — FLUOXETINE HCL 10 MG PO CAPS: 10.0000 mg | ORAL_CAPSULE | Freq: Every day | ORAL | 3 refills | Status: DC

## 2024-01-28 NOTE — Progress Notes (Signed)
        Established patient visit  History, exam, impression, and plan:  1. Depression, recurrent (HCC) Pleasant 48 year old female presenting today for follow-up on depression.  She is currently taking fluoxetine 30 mg daily, tolerating well.  Admits that there are times when she is having a rough day that she will take an additional 10 mg capsule to equal 40 mg daily which also seems to work well for her and is well-tolerated.  Denies SI/HI.  Feels that her mood is stable on the medication and that she is less irritable and reactive while taking it.  Happy with her agree with her current regimen and does not desire a change in dose or medication at this time.  Continue fluoxetine as prescribed.  We are writing her 10 mg dose so that she can take 1 to 2 capsules to equal 30-40 mg daily depending on her perceived need and tolerance. - FLUoxetine (PROZAC) 10 MG capsule; Take 1-2 capsules (10-20 mg total) by mouth daily. Take with 20 mg capsules for total daily dose 30-40 mg  Dispense: 180 capsule; Refill: 3 - FLUoxetine (PROZAC) 20 MG capsule; Take 1 capsule (20 mg total) by mouth daily.  Dispense: 90 capsule; Refill: 3  2. Family history of autoimmune disorder (Primary) 3. Family history of lupus erythematosus 4. Family history of sarcoidosis 5. Family history of factor V deficiency Reports that she has a very strong family history of autoimmune disorders as well as rheumatological concerns including Brugada syndrome, factor V deficiency, sarcoidosis, and lupus.  She is concerned considering she has multiple symptoms that have been undiagnosed.  Also concerned that she has been gaining weight regardless of dietary habits and activity levels.  Also has a transient rash that is scattered, macular, light brown, and widespread across the back, abdomen, back of the arms, and face.  Would like further investigation given her strong family history.  Referring to rheumatology per patient request.  Procedures  performed this visit: None.  Return if symptoms worsen or fail to improve.  __________________________________ Thayer Ohm, DNP, APRN, FNP-BC Primary Care and Sports Medicine [facility]

## 2024-01-30 ENCOUNTER — Other Ambulatory Visit: Payer: Self-pay | Admitting: Medical-Surgical

## 2024-01-30 DIAGNOSIS — J302 Other seasonal allergic rhinitis: Secondary | ICD-10-CM

## 2024-02-06 ENCOUNTER — Encounter: Payer: Self-pay | Admitting: Neurology

## 2024-02-07 ENCOUNTER — Other Ambulatory Visit: Payer: Self-pay | Admitting: Neurology

## 2024-02-07 MED ORDER — BD LUER-LOK SYRINGE 23G X 1" 3 ML MISC: 1 refills | Status: DC

## 2024-02-07 MED ORDER — KETOROLAC TROMETHAMINE 60 MG/2ML IM SOLN: INTRAMUSCULAR | 0 refills | Status: DC

## 2024-02-17 ENCOUNTER — Telehealth: Payer: Self-pay | Admitting: Neurology

## 2024-02-17 NOTE — Telephone Encounter (Signed)
 Submitted auth renewal via CMM, status is pending. Key: [id number]

## 2024-02-18 NOTE — Telephone Encounter (Signed)
 Auth was approved, pt will continue to fill through [facility].  Auth#: [id number] (02/17/24-05/18/24)

## 2024-02-27 ENCOUNTER — Other Ambulatory Visit: Payer: Self-pay

## 2024-03-02 ENCOUNTER — Other Ambulatory Visit: Payer: Self-pay

## 2024-03-02 NOTE — Progress Notes (Signed)
 Specialty Pharmacy Refill Coordination Note  [name] is a 48 y.o. female contacted today regarding refills of specialty medication(s) OnabotulinumtoxinA (Botox)   Patient requested Courier to Provider Office   Delivery date: 03/11/24   Verified address: [facility] [address]   [place] [address]   Medication will be filled on 04.22.25.

## 2024-03-04 ENCOUNTER — Other Ambulatory Visit: Payer: Self-pay | Admitting: Medical-Surgical

## 2024-03-04 DIAGNOSIS — J302 Other seasonal allergic rhinitis: Secondary | ICD-10-CM

## 2024-03-16 ENCOUNTER — Ambulatory Visit (INDEPENDENT_AMBULATORY_CARE_PROVIDER_SITE_OTHER): Payer: 59 | Admitting: Neurology

## 2024-03-16 DIAGNOSIS — G43711 Chronic migraine without aura, intractable, with status migrainosus: Secondary | ICD-10-CM | POA: Diagnosis not present

## 2024-03-16 MED ORDER — ONABOTULINUMTOXINA 200 UNITS IJ SOLR
155.0000 [IU] | Freq: Once | INTRAMUSCULAR | Status: AC
  Administered 2024-03-16: 155 [IU] via INTRAMUSCULAR

## 2024-03-16 MED ORDER — KETOROLAC TROMETHAMINE 60 MG/2ML IM SOLN: INTRAMUSCULAR | 0 refills | Status: DC

## 2024-03-16 MED ORDER — BUTALBITAL-APAP-CAFFEINE 50-325-40 MG PO TABS
1.0000 | ORAL_TABLET | Freq: Four times a day (QID) | ORAL | 5 refills | Status: DC | PRN
Start: 2024-03-16 — End: 2024-07-01

## 2024-03-16 MED ORDER — BD LUER-LOK SYRINGE 23G X 1" 3 ML MISC: 11 refills | Status: AC

## 2024-03-16 MED ORDER — METHYLPREDNISOLONE 4 MG PO TBPK: ORAL_TABLET | ORAL | 3 refills | Status: AC

## 2024-03-16 MED ORDER — HYDROCODONE-ACETAMINOPHEN 5-325 MG PO TABS: 2.0000 | ORAL_TABLET | Freq: Four times a day (QID) | ORAL | 0 refills | Status: DC | PRN

## 2024-03-16 MED ORDER — BACLOFEN 10 MG PO TABS: 10.0000 mg | ORAL_TABLET | Freq: Three times a day (TID) | ORAL | 3 refills | Status: DC | PRN

## 2024-03-16 MED ORDER — IBUPROFEN 800 MG PO TABS: 800.0000 mg | ORAL_TABLET | Freq: Three times a day (TID) | ORAL | 6 refills | Status: DC | PRN

## 2024-03-16 MED ORDER — PROCHLORPERAZINE MALEATE 10 MG PO TABS: 10.0000 mg | ORAL_TABLET | Freq: Four times a day (QID) | ORAL | 11 refills | Status: AC | PRN

## 2024-03-16 NOTE — Progress Notes (Signed)
 Consent Form Botulism Toxin Injection For Chronic Migraine  03/16/2024: stable. Will refill her vicodin next appointment or sooner if needed; she is compliant, she does not get medications from other physicians, give her a limited amount for severe intractable migraines to keep her out of the emergency room, has been getting limited vicodin even before seeing me, never asks for increase, has never increased freq of requests or dosage. Patient is reliable and completely knowledgeable about this medication and does not overuse. Has had the same dose for the last 4 neurologists and has been seeing me for years.   12/23/2023: Still gets great benefit now Now 4 migraines a month and < 10 total headache days a month  09/30/2023: She is not a clencher. No additional injections just botox  straight protocol. Hemiplegic migraines. She has her teeth pulled, the dentures make her jaw hurt and gives her migraines and we could write a letter attesting to that and that she may need posts and teeth implanted, she may be perimenopausal, her diet has changed wince removal of her teeth, she doesn't eat like she usually does.   She is not feeling well, will order a full panel of tests and f/u pcp  07/02/2023: stable   03/26/2023: She had all her teeth removed due to dry mouth she had widespread teeth deterioiration and since then her migraines are exceedingly better. Now 4 migraines a month and < 10 total headache days a month  01/01/2023: stable refilled meds trying qulipta  then vyepti  No orders of the defined types were placed in this encounter.    10/09/2022: >50% improvement in migraine freq and severity. Zavagepant approved. Trying to get vyepti approved. She has already tried 2 other providers, she can stay with me for botox .   She gets > 50% improvement of frequency f migraine with botox  but stll has residual: she still has > 10 migraine days a month and > 15 headache days a month. Botox  still helps > 50%  but still having residual chronic migraine will add on vyepti.  We have tried a plethora of medications.  We will try the vyept start 100mg  first injection and then increase to 300mg  if tolerated. She has tried all 3 injectables.    From a current review of records, medications tried in the past that can be used in migraine management include Tylenol , baclofen (did well but impaired her milk when breastfeeding), currently on Botox  every 12 weeks, currently on limited Fioricet (limited supply every month, discussed rebound extensively with patient), Flexeril , Medrol  Dosepak, Decadron  injections, Benadryl , Aimovig , Prozac , Emgality , on a very limited supply of hydrocodone  (again she is given a very limited supply of hydrocodone  to use only as absolutely needed), ibuprofen , seroquel, ketorolac  oral and injections, we have prescribed Toradol  injections for self administration at home, lasmiditan , Robaxin , Reglan , Zofran , prednisone  tablets, Compazine  tablets, Phenergan  suppository, Seroquel, Nurtec, Zoloft, Topamax , Qudexy ,Ubrelvy  caused stomach discomfort. Lasmiditan  did not work. allergic to celebrex , melatonin, she declines triptans(She had a great response to imitrex in the past but then had side effects which were likely due to the vasococonstrictive effects and possible TIA so contraindicated - also tried maxalt, treximet.), Amitrip, nortrip,  venlafaxine.  Blood pressure medications in the past have been contraindicated due to her very low blood pressure and pulse. She uses caffeine , Tylenol , Advil  and Fioricet in that order to treat her acute headaches. In the past she has tried [place] with minimal benefit. Baclofen  in the past helped but decreased her milk supply  may consider trying again since she is no longer breast feeding. Has not tried Lyrica, Gabapentin (made her dizzy).. She does not want to try cymbalta because her son tried it and became suicidal. Her blood pressure is a lot better and now she is not  bradycardic so next could also consider trying propranolol  acutely or preventatively - tried propranolol  01/2022, Zevagepant made it worse  From 01/02/2022 visit: From a current review of records, medications tried in the past that can be used in migraine management include Tylenol , baclofen (did well but impaired her milk when breastfeeding), currently on Botox  every 12 weeks, currently on limited Fioricet (limited supply every month, discussed rebound extensively with patient), Flexeril , Medrol  Dosepak, Decadron  injections, Benadryl , Aimovig , Prozac , Emgality , on a very limited supply of hydrocodone  (again she is given a very limited supply of hydrocodone  to use only as absolutely needed), ibuprofen , seroquel, ketorolac  oral and injections, we have prescribed Toradol  injections for self administration at home, lasmiditan , Robaxin , Reglan , Zofran , prednisone  tablets, Compazine  tablets, Phenergan  suppository, Seroquel, Nurtec, Zoloft, Topamax , Qudexy ,Ubrelvy  caused stomach discomfort. Lasmiditan  did not work. allergic to celebrex , melatonin, she declines triptans(She had a great response to imitrex in the past but then had side effects which were likely due to the vasococonstrictive effects and possible TIA so contraindicated - also tried maxalt, treximet.), Amitrip, nortrip,  venlafaxine.  Blood pressure medications in the past have been contraindicated due to her very low blood pressure and pulse and tried propranolol  and had to discontinue due to side effects. She uses caffeine , Tylenol , Advil  and Fioricet in that order to treat her acute headaches. In the past she has tried [place] with minimal benefit. Baclofen  in the past helped but decreased her milk supply may consider trying again since she is no longer breast feeding. Has not tried Lyrica but Gabapentin  made her swollen and dizzy. She does not want to try cymbalta because her son tried it and became suicidal. Her blood pressure is a lot better and now she is  not bradycardic so next could also consider trying propranolol (tried, had to stop it due to side effects) tried IR and ER propranolol .   01/23/2022: Stable, the gabapentin  made her dizzy but helped sleep. We will try propranolol  at bedtime but watch very carefully for bradycardia and hypotension. If tolerated cn change to 60mg  ER at bedtime.     10/24/2021: Doing extremely well on botox  > 70% improvement in frequency and severity of migraines and headaches per patient today.Ubrelvy  caused stomach discomfort. Lasmiditan  did not work. Tried Nurtec. Continue Fioricet, only acute med that works. She is not a clencher and she has no pain behind her eyes. We try to position the occipital and cervical injections "around the pony tail area" and at occipitalis more using "follow the pain protocol"     Reviewed orally with patient, additionally signature is on file:Consent Form Botulism Toxin Injection For Chronic Migraine    Reviewed orally with patient, additionally signature is on file:  Botulism toxin has been approved by the Federal drug administration for treatment of chronic migraine. Botulism toxin does not cure chronic migraine and it may not be effective in some patients.  The administration of botulism toxin is accomplished by injecting a small amount of toxin into the muscles of the neck and head. Dosage must be titrated for each individual. Any benefits resulting from botulism toxin tend to wear off after 3 months with a repeat injection required if benefit is to be maintained. Injections are usually done every 3-4  months with maximum effect peak achieved by about 2 or 3 weeks. Botulism toxin is expensive and you should be sure of what costs you will incur resulting from the injection.  The side effects of botulism toxin use for chronic migraine may include:   -Transient, and usually mild, facial weakness with facial injections  -Transient, and usually mild, head or neck weakness with  head/neck injections  -Reduction or loss of forehead facial animation due to forehead muscle weakness  -Eyelid drooping  -Dry eye  -Pain at the site of injection or bruising at the site of injection  -Double vision  -Potential unknown long term risks  Contraindications: You should not have Botox  if you are pregnant, nursing, allergic to albumin, have an infection, skin condition, or muscle weakness at the site of the injection, or have myasthenia gravis, Lambert-Eaton syndrome, or ALS.  It is also possible that as with any injection, there may be an allergic reaction or no effect from the medication. Reduced effectiveness after repeated injections is sometimes seen and rarely infection at the injection site may occur. All care will be taken to prevent these side effects. If therapy is given over a long time, atrophy and wasting in the muscle injected may occur. Occasionally the patient's become refractory to treatment because they develop antibodies to the toxin. In this event, therapy needs to be modified.  I have read the above information and consent to the administration of botulism toxin.    BOTOX  PROCEDURE NOTE FOR MIGRAINE HEADACHE    Contraindications and precautions discussed with patient(above). Aseptic procedure was observed and patient tolerated procedure. Procedure performed by Dr. Criselda Dolly  The condition has existed for more than 6 months, and pt does not have a diagnosis of ALS, Myasthenia Gravis or Lambert-Eaton Syndrome.  Risks and benefits of injections discussed and pt agrees to proceed with the procedure.  Written consent obtained  These injections are medically necessary. Pt  receives good benefits from these injections. These injections do not cause sedations or hallucinations which the oral therapies may cause.  Description of procedure:  The patient was placed in a sitting position. The standard protocol was used for Botox  as follows, with 5 units of Botox   injected at each site:   -Procerus muscle, midline injection  -Corrugator muscle, bilateral injection  -Frontalis muscle, bilateral injection, with 2 sites each side, medial injection was performed in the upper one third of the frontalis muscle, in the region vertical from the medial inferior edge of the superior orbital rim. The lateral injection was again in the upper one third of the forehead vertically above the lateral limbus of the cornea, 1.5 cm lateral to the medial injection site.  -Temporalis muscle injection, 4 sites, bilaterally. The first injection was 3 cm above the tragus of the ear, second injection site was 1.5 cm to 3 cm up from the first injection site in line with the tragus of the ear. The third injection site was 1.5-3 cm forward between the first 2 injection sites. The fourth injection site was 1.5 cm posterior to the second injection site.   -Occipitalis muscle injection, 3 sites, bilaterally. The first injection was done one half way between the occipital protuberance and the tip of the mastoid process behind the ear. The second injection site was done lateral and superior to the first, 1 fingerbreadth from the first injection. The third injection site was 1 fingerbreadth superiorly and medially from the first injection site.  -Cervical paraspinal muscle injection, 2  sites, bilateral knee first injection site was 1 cm from the midline of the cervical spine, 3 cm inferior to the lower border of the occipital protuberance. The second injection site was 1.5 cm superiorly and laterally to the first injection site.  -Trapezius muscle injection was performed at 3 sites, bilaterally. The first injection site was in the upper trapezius muscle halfway between the inflection point of the neck, and the acromion. The second injection site was one half way between the acromion and the first injection site. The third injection was done between the first injection site and the inflection  point of the neck.   Will return for repeat injection in 3 months.   155  units of Botox  was used, 45 U Botox  not injected was wasted. The patient tolerated the procedure well, there were no complications of the above procedure.

## 2024-03-16 NOTE — Progress Notes (Signed)
 Botox - 200 units x 1 vial Lot: [id number] Expiration: 02/2026 NDC: 1610-9604-54  Bacteriostatic 0.9% Sodium Chloride - 5 mL  Lot: [id number] Expiration: 09/19/2024 NDC: 1478-2956-21  Dx: H08.657 S/P  Witnessed by Inocencio Mania, RN

## 2024-03-20 ENCOUNTER — Ambulatory Visit (INDEPENDENT_AMBULATORY_CARE_PROVIDER_SITE_OTHER): Admitting: Medical-Surgical

## 2024-03-20 ENCOUNTER — Encounter: Payer: Self-pay | Admitting: Medical-Surgical

## 2024-03-20 VITALS — BP 120/80 | HR 92 | Resp 20 | Ht 67.0 in | Wt 196.0 lb

## 2024-03-20 DIAGNOSIS — Z8249 Family history of ischemic heart disease and other diseases of the circulatory system: Secondary | ICD-10-CM | POA: Diagnosis not present

## 2024-03-20 DIAGNOSIS — Z1211 Encounter for screening for malignant neoplasm of colon: Secondary | ICD-10-CM

## 2024-03-20 DIAGNOSIS — E786 Lipoprotein deficiency: Secondary | ICD-10-CM

## 2024-03-20 DIAGNOSIS — Z0000 Encounter for general adult medical examination without abnormal findings: Secondary | ICD-10-CM | POA: Diagnosis not present

## 2024-03-20 NOTE — Patient Instructions (Signed)
 Preventive Care 16-46 Years Old, Female  Preventive care refers to lifestyle choices and visits with your health care provider that can promote health and wellness. Preventive care visits are also called wellness exams.  What can I expect for my preventive care visit?  Counseling  Your health care provider may ask you questions about your:  Medical history, including:  Past medical problems.  Family medical history.  Pregnancy history.  Current health, including:  Menstrual cycle.  Method of birth control.  Emotional well-being.  Home life and relationship well-being.  Sexual activity and sexual health.  Lifestyle, including:  Alcohol, nicotine or tobacco, and drug use.  Access to firearms.  Diet, exercise, and sleep habits.  Work and work Astronomer.  Sunscreen use.  Safety issues such as seatbelt and bike helmet use.  Physical exam  Your health care provider will check your:  Height and weight. These may be used to calculate your BMI (body mass index). BMI is a measurement that tells if you are at a healthy weight.  Waist circumference. This measures the distance around your waistline. This measurement also tells if you are at a healthy weight and may help predict your risk of certain diseases, such as type 2 diabetes and high blood pressure.  Heart rate and blood pressure.  Body temperature.  Skin for abnormal spots.  What immunizations do I need?    Vaccines are usually given at various ages, according to a schedule. Your health care provider will recommend vaccines for you based on your age, medical history, and lifestyle or other factors, such as travel or where you work.  What tests do I need?  Screening  Your health care provider may recommend screening tests for certain conditions. This may include:  Lipid and cholesterol levels.  Diabetes screening. This is done by checking your blood sugar (glucose) after you have not eaten for a while (fasting).  Pelvic exam and Pap test.  Hepatitis B test.  Hepatitis C  test.  HIV (human immunodeficiency virus) test.  STI (sexually transmitted infection) testing, if you are at risk.  Lung cancer screening.  Colorectal cancer screening.  Mammogram. Talk with your health care provider about when you should start having regular mammograms. This may depend on whether you have a family history of breast cancer.  BRCA-related cancer screening. This may be done if you have a family history of breast, ovarian, tubal, or peritoneal cancers.  Bone density scan. This is done to screen for osteoporosis.  Talk with your health care provider about your test results, treatment options, and if necessary, the need for more tests.  Follow these instructions at home:  Eating and drinking    Eat a diet that includes fresh fruits and vegetables, whole grains, lean protein, and low-fat dairy products.  Take vitamin and mineral supplements as recommended by your health care provider.  Do not drink alcohol if:  Your health care provider tells you not to drink.  You are pregnant, may be pregnant, or are planning to become pregnant.  If you drink alcohol:  Limit how much you have to 0-1 drink a day.  Know how much alcohol is in your drink. In the [place] one drink equals one 12 oz bottle of beer (355 mL), one 5 oz glass of wine (148 mL), or one 1 oz glass of hard liquor (44 mL).  Lifestyle  Brush your teeth every morning and night with fluoride toothpaste. Floss one time each day.  Exercise for at least  30 minutes 5 or more days each week.  Do not use any products that contain nicotine or tobacco. These products include cigarettes, chewing tobacco, and vaping devices, such as e-cigarettes. If you need help quitting, ask your health care provider.  Do not use drugs.  If you are sexually active, practice safe sex. Use a condom or other form of protection to prevent STIs.  If you do not wish to become pregnant, use a form of birth control. If you plan to become pregnant, see your health care provider for a  prepregnancy visit.  Take aspirin only as told by your health care provider. Make sure that you understand how much to take and what form to take. Work with your health care provider to find out whether it is safe and beneficial for you to take aspirin daily.  Find healthy ways to manage stress, such as:  Meditation, yoga, or listening to music.  Journaling.  Talking to a trusted person.  Spending time with friends and family.  Minimize exposure to UV radiation to reduce your risk of skin cancer.  Safety  Always wear your seat belt while driving or riding in a vehicle.  Do not drive:  If you have been drinking alcohol. Do not ride with someone who has been drinking.  When you are tired or distracted.  While texting.  If you have been using any mind-altering substances or drugs.  Wear a helmet and other protective equipment during sports activities.  If you have firearms in your house, make sure you follow all gun safety procedures.  Seek help if you have been physically or sexually abused.  What's next?  Visit your health care provider once a year for an annual wellness visit.  Ask your health care provider how often you should have your eyes and teeth checked.  Stay up to date on all vaccines.  This information is not intended to replace advice given to you by your health care provider. Make sure you discuss any questions you have with your health care provider.  Document Revised: 05/03/2021 Document Reviewed: 05/03/2021  Elsevier Patient Education  2024 [employer].

## 2024-03-20 NOTE — Progress Notes (Signed)
 Complete physical exam  Patient: Holly [name]   DOB: [birth year]   47 y.o. Female  MRN: [id number]  Subjective:    Chief Complaint  Patient presents with   Annual Exam    Holly [name] is a 48 y.o. female who presents today for a complete physical exam. She reports consuming a general diet.  Has signed up for Pilates classes.  She generally feels fairly well. She reports sleeping poorly. She does have additional problems to discuss today.    Most recent fall risk assessment:    03/20/2024    2:44 PM  Fall Risk   Falls in the past year? 1  Number falls in past yr: 0  Injury with Fall? 1  Risk for fall due to : History of fall(s)  Follow up Falls evaluation completed     Most recent depression screenings:    03/20/2024    2:44 PM 01/28/2024    3:53 PM  PHQ 2/9 Scores  PHQ - 2 Score 2 4  PHQ- 9 Score 12     Vision:Within last year and Dental: No current dental problems and Receives regular dental care  Social History   Tobacco Use   Smoking status: Never   Smokeless tobacco: Never  Vaping Use   Vaping status: Never Used  Substance Use Topics   Alcohol use: No    Alcohol/week: 0.0 standard drinks of alcohol    Comment: none   Drug use: Never      Patient Care Team: Cherre Cornish, NP as PCP - General (Nurse Practitioner)   Outpatient Medications Prior to Visit  Medication Sig   ALLERGY RELIEF/NASAL DECONGEST 10-240 MG 24 hr tablet Take 1 tablet by mouth daily. NEEDS APPOINTMENT FOR FURTHER REFILLS.   baclofen  (LIORESAL ) 10 MG tablet Take 1 tablet (10 mg total) by mouth 3 (three) times daily as needed for muscle spasms. Or for migraines.   botulinum toxin Type A  (BOTOX ) 200 units injection Provider inject 155 units into the muscles of the head and neck every 12 weeks. Discard remainder.   butalbital -acetaminophen -caffeine  (FIORICET) 50-325-40 MG tablet Take 1-2 tablets by mouth every 6 (six) hours as needed for headache.   clindamycin (CLEOCIN T) 1 %  external solution Apply topically 2 (two) times daily.   FLUoxetine  (PROZAC ) 10 MG capsule Take 1-2 capsules (10-20 mg total) by mouth daily. Take with 20 mg capsules for total daily dose 30-40 mg   FLUoxetine  (PROZAC ) 20 MG capsule Take 1 capsule (20 mg total) by mouth daily.   HYDROcodone -acetaminophen  (NORCO) 5-325 MG tablet Take 2 tablets by mouth every 6 (six) hours as needed for moderate pain (pain score 4-6).   ibuprofen  (ADVIL ) 800 MG tablet Take 1 tablet (800 mg total) by mouth 3 (three) times daily with meals as needed for headache, moderate pain (pain score 4-6) or cramping.   INCASSIA  0.35 MG tablet Take 1 tablet (0.35 mg total) by mouth daily.   ketorolac  (TORADOL ) 60 MG/2ML SOLN injection INJECT 2 ML INTRAMUSCULARLY EVERY 6 HOURS AS NEEDED FOR  MIGRANE. NO MORE THAN 120 MG ( ) IN  24 HOURS AND NO MORE THAN 4 DAYS PER MONTH.   methylPREDNISolone  (MEDROL  DOSEPAK) 4 MG TBPK tablet Take 1-3 tablets by mouth daily as needed for severe headache intractable to other means. Do not fill early this is a 90-day supply.   methylPREDNISolone  (MEDROL  DOSEPAK) 4 MG TBPK tablet Take pills daily all together with food. Take the first dose (6 pills) as soon  as possible. Take the rest each morning. For 6 days total 6-5-4-3-2-1.   mupirocin ointment (BACTROBAN) 2 % Apply topically.   NEEDLE, DISP, 23 G 23G X 1" MISC Attach to syringe to draw up and administer Toradol  into the muscle.   omeprazole  (PRILOSEC) 40 MG capsule Take 1 capsule (40 mg total) by mouth daily.   ondansetron  (ZOFRAN -ODT) 8 MG disintegrating tablet Take 1 tablet (8 mg total) by mouth every 8 (eight) hours as needed for nausea.   prochlorperazine  (COMPAZINE ) 10 MG tablet Take 1 tablet (10 mg total) by mouth every 6 (six) hours as needed.   promethazine  (PHENERGAN ) 25 MG suppository Place 1 suppository (25 mg total) rectally every 6 (six) hours as needed for nausea or vomiting.   Syringe, Disposable, (B-D SYRINGE LUER-LOK 3CC) 3 ML MISC  Attach to needle to draw up and administer Toradol  into the muscle.   SYRINGE-NEEDLE, DISP, 3 ML (B-D 3CC LUER-LOK SYR 23GX1") 23G X 1" 3 ML MISC Attach to syringe to draw up and administer Toradol  into the muscle.   triamcinolone  cream (KENALOG ) 0.1 % Apply 1 application topically 2 (two) times daily. To affected area(s) as needed   TROKENDI  XR 100 MG CP24 Take 100 mg by mouth at bedtime. Take with a 50mg  capsule for a total of 150mg  at bedtme   TROKENDI  XR 50 MG CP24 Take 50 mg by mouth at bedtime. Take with a 100mg  capsule for a total of 150mg  at bedtme.   No facility-administered medications prior to visit.    Review of Systems  Constitutional:  Negative for chills, fever, malaise/fatigue and weight loss.  HENT:  Negative for congestion, ear pain, hearing loss, sinus pain and sore throat.   Eyes:  Negative for blurred vision, photophobia and pain.  Respiratory:  Negative for cough, shortness of breath and wheezing.   Cardiovascular:  Negative for chest pain, palpitations and leg swelling.  Gastrointestinal:  Negative for abdominal pain, blood in stool, constipation, diarrhea, heartburn, melena, nausea and vomiting.  Genitourinary:  Negative for dysuria, frequency, hematuria and urgency.  Musculoskeletal:  Negative for falls and neck pain.  Skin:  Negative for itching and rash.  Neurological:  Positive for tingling, weakness and headaches. Negative for dizziness.  Endo/Heme/Allergies:  Negative for polydipsia. Does not bruise/bleed easily.  Psychiatric/Behavioral:  Positive for depression. Negative for substance abuse and suicidal ideas. The patient is nervous/anxious and has insomnia.      Objective:    BP 120/80 (BP Location: Right Arm, Cuff Size: Normal)   Pulse 92   Resp 20   Ht 5\' 7"  (1.702 m)   Wt 196 lb (88.9 kg)   LMP  (LMP Unknown)   SpO2 99%   BMI 30.70 kg/m    Physical Exam Constitutional:      General: She is not in acute distress.    Appearance: Normal  appearance. She is not ill-appearing.  HENT:     Head: Normocephalic and atraumatic.     Right Ear: Tympanic membrane, ear canal and external ear normal. There is no impacted cerumen.     Left Ear: Tympanic membrane, ear canal and external ear normal. There is no impacted cerumen.     Nose: Nose normal. No congestion or rhinorrhea.     Mouth/Throat:     Mouth: Mucous membranes are moist.     Dentition: Has dentures.     Pharynx: No oropharyngeal exudate or posterior oropharyngeal erythema.  Eyes:     General: No scleral icterus.  Right eye: No discharge.        Left eye: No discharge.     Extraocular Movements: Extraocular movements intact.     Conjunctiva/sclera: Conjunctivae normal.     Pupils: Pupils are equal, round, and reactive to light.  Neck:     Thyroid : No thyromegaly.     Vascular: No carotid bruit or JVD.     Trachea: Trachea normal.  Cardiovascular:     Rate and Rhythm: Normal rate and regular rhythm.     Pulses: Normal pulses.     Heart sounds: Normal heart sounds. No murmur heard.    No friction rub. No gallop.  Pulmonary:     Effort: Pulmonary effort is normal. No respiratory distress.     Breath sounds: Normal breath sounds. No wheezing.  Abdominal:     General: Bowel sounds are normal. There is no distension.     Palpations: Abdomen is soft.     Tenderness: There is no abdominal tenderness. There is no guarding.  Musculoskeletal:        General: Normal range of motion.     Cervical back: Normal range of motion and neck supple.  Lymphadenopathy:     Cervical: No cervical adenopathy.  Skin:    General: Skin is warm and dry.  Neurological:     Mental Status: She is alert and oriented to person, place, and time.     Cranial Nerves: No cranial nerve deficit.  Psychiatric:        Mood and Affect: Mood normal.        Behavior: Behavior normal.        Thought Content: Thought content normal.        Judgment: Judgment normal.   No results found for any  visits on 03/20/24.     Assessment & Plan:    Routine Health Maintenance and Physical Exam  Immunization History  Administered Date(s) Administered   Influenza-Unspecified 08/19/2016, 09/02/2016   Tdap 08/14/2009, 05/02/2015    Health Maintenance  Topic Date Due   Hepatitis C Screening  Never done   Colonoscopy  Never done   Medicare Annual Wellness (AWV)  03/19/2024   COVID-19 Vaccine (1 - 2024-25 season) 04/05/2024 (Originally 07/21/2023)   INFLUENZA VACCINE  06/19/2024   DTaP/Tdap/Td (3 - Td or Tdap) 05/01/2025   Cervical Cancer Screening (HPV/Pap Cotest)  03/10/2028   HIV Screening  Completed   HPV VACCINES  Aged Out   Meningococcal B Vaccine  Aged Out    Discussed health benefits of physical activity, and encouraged her to engage in regular exercise appropriate for her age and condition.  1. Annual physical exam (Primary) Checking labs as below. UTD on preventative care. Wellness information provided with AVS. - CBC with Differential/Platelet - CMP14+EGFR - Lipid panel  2. Colon cancer screening Referring to GI for colonoscopy.  - Ambulatory referral to Gastroenterology  3. Low HDL (under 40) Checking lipids.  - Lipid panel  4. Family history of Brugada syndrome Checking labs to evaluate for Brugada syndrome. - Familial Arrhythmia Panel  Return in about 6 months (around 09/20/2024) for chronic disease follow up.   Bharat Antillon, NP

## 2024-03-21 LAB — FAMILIAL ARRHYTHMIA PANEL

## 2024-03-23 ENCOUNTER — Encounter: Payer: Self-pay | Admitting: Medical-Surgical

## 2024-04-01 ENCOUNTER — Ambulatory Visit: Payer: Self-pay | Admitting: Medical-Surgical

## 2024-04-01 LAB — CMP14+EGFR
ALT: 8 IU/L (ref 0–32)
AST: 15 IU/L (ref 0–40)
Albumin: 4.6 g/dL (ref 3.9–4.9)
Alkaline Phosphatase: 100 IU/L (ref 44–121)
BUN/Creatinine Ratio: 8 — ABNORMAL LOW (ref 9–23)
BUN: 9 mg/dL (ref 6–24)
Bilirubin Total: 0.3 mg/dL (ref 0.0–1.2)
CO2: 22 mmol/L (ref 20–29)
Calcium: 9.5 mg/dL (ref 8.7–10.2)
Chloride: 105 mmol/L (ref 96–106)
Creatinine, Ser: 1.11 mg/dL — ABNORMAL HIGH (ref 0.57–1.00)
Globulin, Total: 2.9 g/dL (ref 1.5–4.5)
Glucose: 90 mg/dL (ref 70–99)
Potassium: 4 mmol/L (ref 3.5–5.2)
Sodium: 141 mmol/L (ref 134–144)
Total Protein: 7.5 g/dL (ref 6.0–8.5)
eGFR: 62 mL/min/{1.73_m2} (ref >59)

## 2024-04-01 LAB — CBC WITH DIFFERENTIAL/PLATELET
Basophils Absolute: 0 10*3/uL (ref 0.0–0.2)
Basos: 0 %
EOS (ABSOLUTE): 0.2 10*3/uL (ref 0.0–0.4)
Eos: 3 %
Hematocrit: 40.5 % (ref 34.0–46.6)
Hemoglobin: 13.5 g/dL (ref 11.1–15.9)
Immature Grans (Abs): 0 10*3/uL (ref 0.0–0.1)
Immature Granulocytes: 0 %
Lymphocytes Absolute: 2.6 10*3/uL (ref 0.7–3.1)
Lymphs: 30 %
MCH: 30.7 pg (ref 26.6–33.0)
MCHC: 33.3 g/dL (ref 31.5–35.7)
MCV: 92 fL (ref 79–97)
Monocytes Absolute: 0.5 10*3/uL (ref 0.1–0.9)
Monocytes: 6 %
Neutrophils Absolute: 5.4 10*3/uL (ref 1.4–7.0)
Neutrophils: 61 %
Platelets: 294 10*3/uL (ref 150–450)
RBC: 4.4 x10E6/uL (ref 3.77–5.28)
RDW: 12.8 % (ref 11.7–15.4)
WBC: 8.8 10*3/uL (ref 3.4–10.8)

## 2024-04-01 LAB — FAMILIAL ARRHYTHMIA PANEL

## 2024-04-01 LAB — LIPID PANEL
Chol/HDL Ratio: 4.6 ratio — ABNORMAL HIGH (ref 0.0–4.4)
Cholesterol, Total: 194 mg/dL (ref 100–199)
HDL: 42 mg/dL (ref >39)
LDL Chol Calc (NIH): 128 mg/dL — ABNORMAL HIGH (ref 0–99)
Triglycerides: 131 mg/dL (ref 0–149)
VLDL Cholesterol Cal: 24 mg/dL (ref 5–40)

## 2024-04-04 ENCOUNTER — Other Ambulatory Visit: Payer: Self-pay | Admitting: Medical-Surgical

## 2024-04-04 DIAGNOSIS — J302 Other seasonal allergic rhinitis: Secondary | ICD-10-CM

## 2024-05-06 ENCOUNTER — Other Ambulatory Visit: Payer: Self-pay | Admitting: Medical-Surgical

## 2024-05-06 DIAGNOSIS — J302 Other seasonal allergic rhinitis: Secondary | ICD-10-CM

## 2024-05-19 NOTE — Telephone Encounter (Signed)
 Submitted auth renewal request via CMM, status is pending. Key: [id number]

## 2024-05-20 NOTE — Telephone Encounter (Signed)
 Received approval, pt will continue to fill through [facility]. Auth#: [id number] (05/19/24-08/19/24)

## 2024-05-28 ENCOUNTER — Telehealth: Payer: Self-pay | Admitting: Neurology

## 2024-05-28 NOTE — Telephone Encounter (Signed)
 LVM and sent MyChart message advising pt of appt change- Dr. Ines out of the office on 7/31.

## 2024-05-29 ENCOUNTER — Other Ambulatory Visit (HOSPITAL_COMMUNITY): Payer: Self-pay

## 2024-06-05 ENCOUNTER — Other Ambulatory Visit: Payer: Self-pay | Admitting: Medical-Surgical

## 2024-06-05 DIAGNOSIS — J302 Other seasonal allergic rhinitis: Secondary | ICD-10-CM

## 2024-06-09 NOTE — Telephone Encounter (Signed)
 Last visit: 03/16/24 Next visit: 06/29/24 Last fill:   From last note: 03/16/2024: stable. Will refill her vicodin next appointment or sooner if needed; she is compliant, she does not get medications from other physicians, give her a limited amount for severe intractable migraines to keep her out of the emergency room, has been getting limited vicodin even before seeing me, never asks for increase, has never increased freq of requests or dosage. Patient is reliable and completely knowledgeable about this medication and does not overuse. Has had the same dose for the last 4 neurologists and has been seeing me for years.   Rx request sent to Dr Ines

## 2024-06-09 NOTE — Addendum Note (Signed)
 Addended by: HILLIARD HEATHER CROME on: 06/09/2024 03:46 PM   Modules accepted: Orders

## 2024-06-10 MED ORDER — HYDROCODONE-ACETAMINOPHEN 5-325 MG PO TABS: 2.0000 | ORAL_TABLET | Freq: Four times a day (QID) | ORAL | 0 refills | Status: DC | PRN

## 2024-06-10 NOTE — Addendum Note (Signed)
 Addended by: Charmagne Buhl B on: 06/10/2024 05:00 PM   Modules accepted: Orders

## 2024-06-17 ENCOUNTER — Other Ambulatory Visit: Payer: Self-pay

## 2024-06-17 NOTE — Progress Notes (Signed)
 Specialty Pharmacy Refill Coordination Note  Holly [name] is a 48 y.o. female assessed today regarding refills of clinic administered specialty medication(s) OnabotulinumtoxinA  (Botox )   Clinic requested Courier to Provider Office   Delivery date: 06/22/24   Verified address: [facility] [address], [address]   [place] [address]   Medication will be filled on 08.01.25.

## 2024-06-18 ENCOUNTER — Other Ambulatory Visit: Payer: Self-pay

## 2024-06-18 ENCOUNTER — Ambulatory Visit: Admitting: Neurology

## 2024-06-26 ENCOUNTER — Other Ambulatory Visit: Payer: Self-pay | Admitting: Neurology

## 2024-06-29 ENCOUNTER — Ambulatory Visit (INDEPENDENT_AMBULATORY_CARE_PROVIDER_SITE_OTHER): Admitting: Neurology

## 2024-06-29 VITALS — BP 115/70 | HR 98

## 2024-06-29 DIAGNOSIS — R252 Cramp and spasm: Secondary | ICD-10-CM | POA: Diagnosis not present

## 2024-06-29 DIAGNOSIS — G43711 Chronic migraine without aura, intractable, with status migrainosus: Secondary | ICD-10-CM | POA: Diagnosis not present

## 2024-06-29 DIAGNOSIS — G2581 Restless legs syndrome: Secondary | ICD-10-CM

## 2024-06-29 DIAGNOSIS — R790 Abnormal level of blood mineral: Secondary | ICD-10-CM

## 2024-06-29 MED ORDER — ONABOTULINUMTOXINA 200 UNITS IJ SOLR
155.0000 [IU] | Freq: Once | INTRAMUSCULAR | Status: AC
Start: 2024-06-29 — End: 2024-06-29
  Administered 2024-06-29 (×2): 155 [IU] via INTRAMUSCULAR

## 2024-06-29 NOTE — Progress Notes (Signed)
 Botox - 200 units x 1 vial Lot: [id number] Expiration: 09/2026 NDC: 9976-6078-97  Bacteriostatic 0.9% Sodium Chloride - 4 mL  Lot: [id number] Expiration: 08/2025 NDC: 9590-8033-97  Dx: G43.711  S/P  Witnessed by Augustin PEAK

## 2024-06-29 NOTE — Progress Notes (Signed)
 Consent Form Botulism Toxin Injection For Chronic Migraine 06/29/2024: Stable.   06/29/2024: Has twitching in the thumb and now in the leg and restless leg syndrome will check labs.  refill vicodin. Need fioricet. Also ibuprofen  800mg .   Lab Orders         Iron, TIBC and Ferritin Panel         Magnesium         CK         Transferrin Saturation     Meds ordered this encounter  Medications   botulinum toxin Type A  (BOTOX ) injection 155 Units    Botox - 200 units x 1 vial Lot: [id number] Expiration: 09/2026 NDC: 9976-6078-97  Bacteriostatic 0.9% Sodium Chloride - 4 mL  Lot: [id number] Expiration: 08/2025 NDC: 9590-8033-97  Dx: G43.711  S/P  Witnessed by Augustin PEAK   butalbital -acetaminophen -caffeine  (FIORICET) 50-325-40 MG tablet    Sig: Take 1-2 tablets by mouth every 6 (six) hours as needed for headache.    Dispense:  15 tablet    Refill:  5    Do not fill early, this is a monthly supply thanks   HYDROcodone -acetaminophen  (NORCO) 5-325 MG tablet    Sig: Take 2 tablets by mouth every 6 (six) hours as needed for moderate pain (pain score 4-6).    Dispense:  56 tablet    Refill:  0    Last filled 03/16/24 #56/30 days   ibuprofen  (ADVIL ) 800 MG tablet    Sig: Take 1 tablet (800 mg total) by mouth 3 (three) times daily with meals as needed for headache, moderate pain (pain score 4-6) or cramping.    Dispense:  90 tablet    Refill:  6     03/16/2024: stable. Will refill her vicodin next appointment or sooner if needed; she is compliant, she does not get medications from other physicians, give her a limited amount for severe intractable migraines to keep her out of the emergency room, has been getting limited vicodin even before seeing me, never asks for increase, has never increased freq of requests or dosage. Patient is reliable and completely knowledgeable about this medication and does not overuse. Has had the same dose for the last 4 neurologists and has been seeing me for years.    12/23/2023: Still gets great benefit now Now 4 migraines a month and < 10 total headache days a month  09/30/2023: She is not a clencher. No additional injections just botox  straight protocol. Hemiplegic migraines. She has her teeth pulled, the dentures make her jaw hurt and gives her migraines and we could write a letter attesting to that and that she may need posts and teeth implanted, she may be perimenopausal, her diet has changed wince removal of her teeth, she doesn't eat like she usually does.   She is not feeling well, will order a full panel of tests and f/u pcp  07/02/2023: stable   03/26/2023: She had all her teeth removed due to dry mouth she had widespread teeth deterioiration and since then her migraines are exceedingly better. Now 4 migraines a month and < 10 total headache days a month  01/01/2023: stable refilled meds trying qulipta  then vyepti  Meds ordered this encounter  Medications   botulinum toxin Type A  (BOTOX ) injection 155 Units    Botox - 200 units x 1 vial Lot: [id number] Expiration: 09/2026 NDC: 9976-6078-97  Bacteriostatic 0.9% Sodium Chloride - 4 mL  Lot: [id number] Expiration: 08/2025 NDC: 9590-8033-97  Dx: G43.711  S/P  Witnessed by Augustin PEAK  butalbital -acetaminophen -caffeine  (FIORICET) 50-325-40 MG tablet    Sig: Take 1-2 tablets by mouth every 6 (six) hours as needed for headache.    Dispense:  15 tablet    Refill:  5    Do not fill early, this is a monthly supply thanks   HYDROcodone -acetaminophen  (NORCO) 5-325 MG tablet    Sig: Take 2 tablets by mouth every 6 (six) hours as needed for moderate pain (pain score 4-6).    Dispense:  56 tablet    Refill:  0    Last filled 03/16/24 #56/30 days   ibuprofen  (ADVIL ) 800 MG tablet    Sig: Take 1 tablet (800 mg total) by mouth 3 (three) times daily with meals as needed for headache, moderate pain (pain score 4-6) or cramping.    Dispense:  90 tablet    Refill:  6     10/09/2022: >50% improvement in  migraine freq and severity. Zavagepant approved. Trying to get vyepti approved. She has already tried 2 other providers, she can stay with me for botox .   She gets > 50% improvement of frequency f migraine with botox  but stll has residual: she still has > 10 migraine days a month and > 15 headache days a month. Botox  still helps > 50% but still having residual chronic migraine will add on vyepti.  We have tried a plethora of medications.  We will try the vyept start 100mg  first injection and then increase to 300mg  if tolerated. She has tried all 3 injectables.    From a current review of records, medications tried in the past that can be used in migraine management include Tylenol , baclofen (did well but impaired her milk when breastfeeding), currently on Botox  every 12 weeks, currently on limited Fioricet (limited supply every month, discussed rebound extensively with patient), Flexeril , Medrol  Dosepak, Decadron  injections, Benadryl , Aimovig , Prozac , Emgality , on a very limited supply of hydrocodone  (again she is given a very limited supply of hydrocodone  to use only as absolutely needed), ibuprofen , seroquel, ketorolac  oral and injections, we have prescribed Toradol  injections for self administration at home, lasmiditan , Robaxin , Reglan , Zofran , prednisone  tablets, Compazine  tablets, Phenergan  suppository, Seroquel, Nurtec, Zoloft, Topamax , Qudexy ,Ubrelvy  caused stomach discomfort. Lasmiditan  did not work. allergic to celebrex , melatonin, she declines triptans(She had a great response to imitrex in the past but then had side effects which were likely due to the vasococonstrictive effects and possible TIA so contraindicated - also tried maxalt, treximet.), Amitrip, nortrip,  venlafaxine.  Blood pressure medications in the past have been contraindicated due to her very low blood pressure and pulse. She uses caffeine , Tylenol , Advil  and Fioricet in that order to treat her acute headaches. In the past she has  tried [place] with minimal benefit. Baclofen  in the past helped but decreased her milk supply may consider trying again since she is no longer breast feeding. Has not tried Lyrica, Gabapentin (made her dizzy).. She does not want to try cymbalta because her son tried it and became suicidal. Her blood pressure is a lot better and now she is not bradycardic so next could also consider trying propranolol  acutely or preventatively - tried propranolol  01/2022, Zevagepant made it worse  From 01/02/2022 visit: From a current review of records, medications tried in the past that can be used in migraine management include Tylenol , baclofen (did well but impaired her milk when breastfeeding), currently on Botox  every 12 weeks, currently on limited Fioricet (limited supply every month, discussed rebound extensively with patient), Flexeril , Medrol  Dosepak, Decadron  injections, Benadryl , Aimovig , Prozac ,  Emgality , on a very limited supply of hydrocodone  (again she is given a very limited supply of hydrocodone  to use only as absolutely needed), ibuprofen , seroquel, ketorolac  oral and injections, we have prescribed Toradol  injections for self administration at home, lasmiditan , Robaxin , Reglan , Zofran , prednisone  tablets, Compazine  tablets, Phenergan  suppository, Seroquel, Nurtec, Zoloft, Topamax , Qudexy ,Ubrelvy  caused stomach discomfort. Lasmiditan  did not work. allergic to celebrex , melatonin, she declines triptans(She had a great response to imitrex in the past but then had side effects which were likely due to the vasococonstrictive effects and possible TIA so contraindicated - also tried maxalt, treximet.), Amitrip, nortrip,  venlafaxine.  Blood pressure medications in the past have been contraindicated due to her very low blood pressure and pulse and tried propranolol  and had to discontinue due to side effects. She uses caffeine , Tylenol , Advil  and Fioricet in that order to treat her acute headaches. In the past she has tried  [place] with minimal benefit. Baclofen  in the past helped but decreased her milk supply may consider trying again since she is no longer breast feeding. Has not tried Lyrica but Gabapentin  made her swollen and dizzy. She does not want to try cymbalta because her son tried it and became suicidal. Her blood pressure is a lot better and now she is not bradycardic so next could also consider trying propranolol (tried, had to stop it due to side effects) tried IR and ER propranolol .   01/23/2022: Stable, the gabapentin  made her dizzy but helped sleep. We will try propranolol  at bedtime but watch very carefully for bradycardia and hypotension. If tolerated cn change to 60mg  ER at bedtime.     10/24/2021: Doing extremely well on botox  > 70% improvement in frequency and severity of migraines and headaches per patient today.Ubrelvy  caused stomach discomfort. Lasmiditan  did not work. Tried Nurtec. Continue Fioricet, only acute med that works. She is not a clencher and she has no pain behind her eyes. We try to position the occipital and cervical injections around the pony tail area and at occipitalis more using follow the pain protocol     Reviewed orally with patient, additionally signature is on file:Consent Form Botulism Toxin Injection For Chronic Migraine    Reviewed orally with patient, additionally signature is on file:  Botulism toxin has been approved by the Federal drug administration for treatment of chronic migraine. Botulism toxin does not cure chronic migraine and it may not be effective in some patients.  The administration of botulism toxin is accomplished by injecting a small amount of toxin into the muscles of the neck and head. Dosage must be titrated for each individual. Any benefits resulting from botulism toxin tend to wear off after 3 months with a repeat injection required if benefit is to be maintained. Injections are usually done every 3-4 months with maximum effect peak achieved  by about 2 or 3 weeks. Botulism toxin is expensive and you should be sure of what costs you will incur resulting from the injection.  The side effects of botulism toxin use for chronic migraine may include:   -Transient, and usually mild, facial weakness with facial injections  -Transient, and usually mild, head or neck weakness with head/neck injections  -Reduction or loss of forehead facial animation due to forehead muscle weakness  -Eyelid drooping  -Dry eye  -Pain at the site of injection or bruising at the site of injection  -Double vision  -Potential unknown long term risks  Contraindications: You should not have Botox  if you are pregnant, nursing, allergic to  albumin, have an infection, skin condition, or muscle weakness at the site of the injection, or have myasthenia gravis, Lambert-Eaton syndrome, or ALS.  It is also possible that as with any injection, there may be an allergic reaction or no effect from the medication. Reduced effectiveness after repeated injections is sometimes seen and rarely infection at the injection site may occur. All care will be taken to prevent these side effects. If therapy is given over a long time, atrophy and wasting in the muscle injected may occur. Occasionally the patient's become refractory to treatment because they develop antibodies to the toxin. In this event, therapy needs to be modified.  I have read the above information and consent to the administration of botulism toxin.    BOTOX  PROCEDURE NOTE FOR MIGRAINE HEADACHE    Contraindications and precautions discussed with patient(above). Aseptic procedure was observed and patient tolerated procedure. Procedure performed by Dr. Andree Epp  The condition has existed for more than 6 months, and pt does not have a diagnosis of ALS, Myasthenia Gravis or Lambert-Eaton Syndrome.  Risks and benefits of injections discussed and pt agrees to proceed with the procedure.  Written consent  obtained  These injections are medically necessary. Pt  receives good benefits from these injections. These injections do not cause sedations or hallucinations which the oral therapies may cause.  Description of procedure:  The patient was placed in a sitting position. The standard protocol was used for Botox  as follows, with 5 units of Botox  injected at each site:   -Procerus muscle, midline injection  -Corrugator muscle, bilateral injection  -Frontalis muscle, bilateral injection, with 2 sites each side, medial injection was performed in the upper one third of the frontalis muscle, in the region vertical from the medial inferior edge of the superior orbital rim. The lateral injection was again in the upper one third of the forehead vertically above the lateral limbus of the cornea, 1.5 cm lateral to the medial injection site.  -Temporalis muscle injection, 4 sites, bilaterally. The first injection was 3 cm above the tragus of the ear, second injection site was 1.5 cm to 3 cm up from the first injection site in line with the tragus of the ear. The third injection site was 1.5-3 cm forward between the first 2 injection sites. The fourth injection site was 1.5 cm posterior to the second injection site.   -Occipitalis muscle injection, 3 sites, bilaterally. The first injection was done one half way between the occipital protuberance and the tip of the mastoid process behind the ear. The second injection site was done lateral and superior to the first, 1 fingerbreadth from the first injection. The third injection site was 1 fingerbreadth superiorly and medially from the first injection site.  -Cervical paraspinal muscle injection, 2 sites, bilateral knee first injection site was 1 cm from the midline of the cervical spine, 3 cm inferior to the lower border of the occipital protuberance. The second injection site was 1.5 cm superiorly and laterally to the first injection site.  -Trapezius muscle  injection was performed at 3 sites, bilaterally. The first injection site was in the upper trapezius muscle halfway between the inflection point of the neck, and the acromion. The second injection site was one half way between the acromion and the first injection site. The third injection was done between the first injection site and the inflection point of the neck.   Will return for repeat injection in 3 months.   155  units of Botox   was used, 45 U Botox  not injected was wasted. The patient tolerated the procedure well, there were no complications of the above procedure.

## 2024-07-01 ENCOUNTER — Ambulatory Visit: Payer: Self-pay | Admitting: Neurology

## 2024-07-01 MED ORDER — IBUPROFEN 800 MG PO TABS: 800.0000 mg | ORAL_TABLET | Freq: Three times a day (TID) | ORAL | 6 refills | Status: AC | PRN

## 2024-07-01 MED ORDER — HYDROCODONE-ACETAMINOPHEN 5-325 MG PO TABS: 2.0000 | ORAL_TABLET | Freq: Four times a day (QID) | ORAL | 0 refills | Status: AC | PRN

## 2024-07-01 MED ORDER — BUTALBITAL-APAP-CAFFEINE 50-325-40 MG PO TABS: 1.0000 | ORAL_TABLET | Freq: Four times a day (QID) | ORAL | 5 refills | Status: AC | PRN

## 2024-07-06 LAB — MAGNESIUM: Magnesium: 2.3 mg/dL (ref 1.6–2.3)

## 2024-07-06 LAB — IRON,TIBC AND FERRITIN PANEL
Ferritin: 39 ng/mL (ref 15–150)
Iron Saturation: 31 % (ref 15–55)
Iron: 84 ug/dL (ref 27–159)
Total Iron Binding Capacity: 267 ug/dL (ref 250–450)
UIBC: 183 ug/dL (ref 131–425)

## 2024-07-06 LAB — CK: Total CK: 69 U/L (ref 32–182)

## 2024-07-06 LAB — TRANSFERRIN SATURATION
IRON SATN MFR SERPL: 29 %{saturation}
IRON SERPL-MCNC: 85 ug/dL
TRANSFERRIN SERPL-MCNC: 211 mg/dL

## 2024-07-13 ENCOUNTER — Other Ambulatory Visit: Payer: Self-pay | Admitting: Medical-Surgical

## 2024-07-13 DIAGNOSIS — J302 Other seasonal allergic rhinitis: Secondary | ICD-10-CM

## 2024-08-03 ENCOUNTER — Other Ambulatory Visit: Payer: Self-pay | Admitting: Neurology

## 2024-08-11 ENCOUNTER — Other Ambulatory Visit: Payer: Self-pay | Admitting: Medical-Surgical

## 2024-08-11 DIAGNOSIS — J302 Other seasonal allergic rhinitis: Secondary | ICD-10-CM

## 2024-08-25 NOTE — Telephone Encounter (Signed)
 LVM and sent MyChart msg informing pt of r/s needed for 11/3 appt- MD departure.

## 2024-08-27 ENCOUNTER — Other Ambulatory Visit: Payer: Self-pay

## 2024-08-27 MED ORDER — KETOROLAC TROMETHAMINE 60 MG/2ML IM SOLN: INTRAMUSCULAR | 0 refills | Status: AC

## 2024-09-04 ENCOUNTER — Other Ambulatory Visit: Payer: Self-pay

## 2024-09-11 ENCOUNTER — Other Ambulatory Visit: Payer: Self-pay

## 2024-09-12 ENCOUNTER — Other Ambulatory Visit: Payer: Self-pay | Admitting: Medical-Surgical

## 2024-09-12 DIAGNOSIS — J302 Other seasonal allergic rhinitis: Secondary | ICD-10-CM

## 2024-09-14 ENCOUNTER — Other Ambulatory Visit: Payer: Self-pay

## 2024-09-15 ENCOUNTER — Other Ambulatory Visit: Payer: Self-pay

## 2024-09-17 NOTE — Progress Notes (Signed)
 PA request sent by [facility]

## 2024-09-21 ENCOUNTER — Ambulatory Visit: Admitting: Neurology

## 2024-10-02 ENCOUNTER — Other Ambulatory Visit: Payer: Self-pay | Admitting: Medical-Surgical

## 2024-10-02 DIAGNOSIS — J302 Other seasonal allergic rhinitis: Secondary | ICD-10-CM

## 2024-10-05 ENCOUNTER — Other Ambulatory Visit (HOSPITAL_COMMUNITY): Payer: Self-pay

## 2024-10-13 ENCOUNTER — Other Ambulatory Visit: Payer: Self-pay

## 2024-10-13 NOTE — Telephone Encounter (Signed)
 Pt has not reached out to r/s with another provider. She will need re-enrolled with [facility] if she r/s.

## 2024-10-13 NOTE — Progress Notes (Signed)
 Office has reached out to schedule multiple times, and patient has not responded. Dis-enrolling.

## 2024-10-26 ENCOUNTER — Ambulatory Visit: Admitting: Medical-Surgical

## 2024-10-26 NOTE — Progress Notes (Deleted)
        Established patient visit   History of Present Illness   Discussed the use of AI scribe software for clinical note transcription with the patient, who gave verbal consent to proceed.  History of Present Illness           Physical Exam   Physical Exam Assessment & Plan   Problem List Items Addressed This Visit   None  Assessment and Plan             Follow up   No follow-ups on file. __________________________________ Holly FREDRIK Palin, DNP, APRN, FNP-BC Primary Care and Sports Medicine [facility] [place]

## 2024-11-09 ENCOUNTER — Other Ambulatory Visit: Payer: Self-pay

## 2024-11-09 MED ORDER — TROKENDI XR 50 MG PO CP24: ORAL_CAPSULE | ORAL | 0 refills | Status: DC

## 2024-11-17 ENCOUNTER — Encounter: Payer: Self-pay | Admitting: Medical-Surgical

## 2024-11-17 ENCOUNTER — Ambulatory Visit: Admitting: Medical-Surgical

## 2024-11-17 VITALS — BP 127/81 | HR 100 | Resp 20 | Ht 67.0 in | Wt 206.0 lb

## 2024-11-17 DIAGNOSIS — G43711 Chronic migraine without aura, intractable, with status migrainosus: Secondary | ICD-10-CM

## 2024-11-17 DIAGNOSIS — G43409 Hemiplegic migraine, not intractable, without status migrainosus: Secondary | ICD-10-CM

## 2024-11-17 DIAGNOSIS — J302 Other seasonal allergic rhinitis: Secondary | ICD-10-CM

## 2024-11-17 MED ORDER — BACLOFEN 10 MG PO TABS: 10.0000 mg | ORAL_TABLET | Freq: Three times a day (TID) | ORAL | 0 refills | Status: AC | PRN

## 2024-11-17 MED ORDER — TROKENDI XR 50 MG PO CP24: ORAL_CAPSULE | ORAL | 0 refills | Status: AC

## 2024-11-17 MED ORDER — ONDANSETRON 8 MG PO TBDP: 8.0000 mg | ORAL_TABLET | Freq: Three times a day (TID) | ORAL | 11 refills | Status: AC | PRN

## 2024-11-17 MED ORDER — OMEPRAZOLE 40 MG PO CPDR: 40.0000 mg | DELAYED_RELEASE_CAPSULE | Freq: Every day | ORAL | 3 refills | Status: AC

## 2024-11-17 MED ORDER — ALLERGY RELIEF/NASAL DECONGEST 10-240 MG PO TB24: 1.0000 | ORAL_TABLET | Freq: Every day | ORAL | 1 refills | Status: AC

## 2024-11-17 NOTE — Progress Notes (Unsigned)
 "        Established patient visit   History of Present Illness   Discussed the use of AI scribe software for clinical note transcription with the patient, who gave verbal consent to proceed.  History of Present Illness   Holly [name] is a 48 year old female with chronic migraines who presents with worsening migraine symptoms and medication management issues.  Migraine headaches - Chronic migraines with marked increase in frequency and severity since loss of access to Botox  therapy due to unavailable neurologist and lack of refills or new provider - Headaches are severe, limit daily functioning, and restrict sleep to less than two hours nightly - Allodynia present; light touch and sleep are painful - Opioid therapy for migraine pain for approximately twenty years at a stable dose of fifty-six pills every ninety days - Avoids triptans due to prior worsening of migraines - Unable to get in with a new provider at her neurology office until July  Psychological symptoms - High stress and frequent emotional breakdowns - Difficulty managing children - Fluoxetine  40 mg daily, increased two months ago, with uncertain benefit  Gastrointestinal and nutritional issues - Gastrointestinal symptoms and edentulous status leading to a mostly liquid diet - Weight gain despite liquid diet; current weight 208 lb - Difficulty consuming protein due to poorly fitting dentures and gum pain - Electrolyte water intake and attempts at protein shakes, but dental issues limit intake  Supplement use and perimenopausal symptoms - Consistently takes magnesium, iron, and vitamin D  - Possible perimenopausal symptoms including excessive sweating and dizziness  Physical Exam   Physical Exam Vitals reviewed.  Constitutional:      General: She is not in acute distress.    Appearance: Normal appearance.  HENT:     Head: Normocephalic and atraumatic.  Cardiovascular:     Rate and Rhythm: Normal rate and  regular rhythm.     Pulses: Normal pulses.     Heart sounds: Normal heart sounds. No murmur heard.    No friction rub. No gallop.  Pulmonary:     Effort: Pulmonary effort is normal. No respiratory distress.     Breath sounds: Normal breath sounds. No wheezing.  Skin:    General: Skin is warm and dry.  Neurological:     Mental Status: She is alert and oriented to person, place, and time.  Psychiatric:        Mood and Affect: Mood normal.        Behavior: Behavior normal.        Thought Content: Thought content normal.        Judgment: Judgment normal.    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 Significant pain and functional impairment. Anxiety and PTSD-like symptoms due to neurologist unavailability. Current treatment includes opioids and triptans with minimal to moderate efficacy and side effect concerns. - Sent refills for baclofen , omeprazole , Zofran , and Trokendi . - Encouraged neurology follow-up for Botox  and migraine management. - Discussed potential fluoxetine  increase if neurology appointment delayed.  Major depressive disorder Current fluoxetine  40 mg daily. Symptoms include emotional distress and functional impairment. Considering dosage increase if neurology appointment delayed. - Continue fluoxetine  40 mg daily. - Consider increasing fluoxetine  to 60 mg if neurology appointment delayed. - Encouraged follow-up if symptoms persist or worsen.  Obesity Recent weight gain despite liquid diet and reduced soda intake. Low protein intake and potential metabolic changes due to  chronic pain and medication use. Exercise difficulty due to pain and dizziness. - Encouraged increased protein intake. - Discussed impact of low protein intake on weight gain and metabolism.  Chronic seasonal allergic rhinitis Managed with daily Claritin  and pseudoephedrine . Symptoms include nasal congestion  and pressure, potential migraine triggers. - Continue Claritin  and pseudoephedrine  as needed.     Follow up   Return in about 3 months (around 02/15/2025) for chronic disease follow up. __________________________________ Zada FREDRIK Palin, DNP, APRN, FNP-BC Primary Care and Sports Medicine [facility] [place] "

## 2024-11-26 ENCOUNTER — Encounter: Payer: Self-pay | Admitting: Medical-Surgical

## 2024-11-26 DIAGNOSIS — F339 Major depressive disorder, recurrent, unspecified: Secondary | ICD-10-CM

## 2024-12-08 MED ORDER — FLUOXETINE HCL 20 MG PO CAPS: 60.0000 mg | ORAL_CAPSULE | Freq: Every day | ORAL | 3 refills | Status: AC

## 2024-12-08 MED ORDER — PRAZOSIN HCL 1 MG PO CAPS: 1.0000 mg | ORAL_CAPSULE | Freq: Every day | ORAL | 0 refills | Status: AC

## 2024-12-24 ENCOUNTER — Ambulatory Visit: Admitting: Medical-Surgical
